# Patient Record
Sex: Male | Born: 1965 | Race: White | Hispanic: No | Marital: Married | Smoking: Current every day smoker
Health system: Southern US, Community
[De-identification: ages and names within clinical notes are randomized; demographics above are authoritative.]

## PROBLEM LIST (undated history)

## (undated) DIAGNOSIS — F32A Depression, unspecified: Secondary | ICD-10-CM

## (undated) DIAGNOSIS — F329 Major depressive disorder, single episode, unspecified: Secondary | ICD-10-CM

## (undated) DIAGNOSIS — I214 Non-ST elevation (NSTEMI) myocardial infarction: Secondary | ICD-10-CM

## (undated) DIAGNOSIS — I10 Essential (primary) hypertension: Secondary | ICD-10-CM

## (undated) DIAGNOSIS — F419 Anxiety disorder, unspecified: Secondary | ICD-10-CM

## (undated) DIAGNOSIS — E785 Hyperlipidemia, unspecified: Secondary | ICD-10-CM

## (undated) DIAGNOSIS — M5126 Other intervertebral disc displacement, lumbar region: Secondary | ICD-10-CM

## (undated) HISTORY — PX: BACK SURGERY: SHX140

## (undated) HISTORY — PX: TONSILLECTOMY: SUR1361

---

## 2000-03-22 HISTORY — PX: LUMBAR LAMINECTOMY: SHX95

## 2014-09-24 ENCOUNTER — Encounter (HOSPITAL_COMMUNITY): Payer: Self-pay

## 2014-09-24 ENCOUNTER — Emergency Department (HOSPITAL_COMMUNITY)
Admission: EM | Admit: 2014-09-24 | Discharge: 2014-09-24 | Disposition: A | Discharge status: Home / Self Care | Payer: BLUE CROSS/BLUE SHIELD | Attending: Emergency Medicine | Admitting: Emergency Medicine

## 2014-09-24 ENCOUNTER — Emergency Department (HOSPITAL_COMMUNITY): Payer: BLUE CROSS/BLUE SHIELD

## 2014-09-24 DIAGNOSIS — I10 Essential (primary) hypertension: Secondary | ICD-10-CM | POA: Diagnosis not present

## 2014-09-24 DIAGNOSIS — N451 Epididymitis: Secondary | ICD-10-CM | POA: Diagnosis not present

## 2014-09-24 DIAGNOSIS — K4090 Unilateral inguinal hernia, without obstruction or gangrene, not specified as recurrent: Secondary | ICD-10-CM

## 2014-09-24 DIAGNOSIS — Z720 Tobacco use: Secondary | ICD-10-CM | POA: Insufficient documentation

## 2014-09-24 DIAGNOSIS — Z8659 Personal history of other mental and behavioral disorders: Secondary | ICD-10-CM | POA: Diagnosis not present

## 2014-09-24 DIAGNOSIS — R52 Pain, unspecified: Secondary | ICD-10-CM

## 2014-09-24 DIAGNOSIS — N508 Other specified disorders of male genital organs: Secondary | ICD-10-CM | POA: Diagnosis present

## 2014-09-24 HISTORY — DX: Major depressive disorder, single episode, unspecified: F32.9

## 2014-09-24 HISTORY — DX: Depression, unspecified: F32.A

## 2014-09-24 HISTORY — DX: Essential (primary) hypertension: I10

## 2014-09-24 HISTORY — DX: Anxiety disorder, unspecified: F41.9

## 2014-09-24 LAB — URINALYSIS, ROUTINE W REFLEX MICROSCOPIC
BILIRUBIN URINE: NEGATIVE
Glucose, UA: NEGATIVE mg/dL
KETONES UR: NEGATIVE mg/dL
Nitrite: NEGATIVE
Protein, ur: NEGATIVE mg/dL
Specific Gravity, Urine: 1.013 (ref 1.005–1.030)
UROBILINOGEN UA: 0.2 mg/dL (ref 0.0–1.0)
pH: 7 (ref 5.0–8.0)

## 2014-09-24 LAB — URINE MICROSCOPIC-ADD ON

## 2014-09-24 MED ORDER — LEVOFLOXACIN 500 MG PO TABS: 500.0000 mg | ORAL_TABLET | Freq: Two times a day (BID) | ORAL | Status: DC

## 2014-09-24 NOTE — Discharge Instructions (Signed)
Epididymitis Epididymitis is a swelling (inflammation) of the epididymis. The epididymis is a cord-like structure along the back part of the testicle. Epididymitis is usually, but not always, caused by infection. This is usually a sudden problem beginning with chills, fever and pain behind the scrotum and in the testicle. There may be swelling and redness of the testicle. DIAGNOSIS  Physical examination will reveal a tender, swollen epididymis. Sometimes, cultures are obtained from the urine or from prostate secretions to help find out if there is an infection or if the cause is a different problem. Sometimes, blood work is performed to see if your white blood cell count is elevated and if a germ (bacterial) or viral infection is present. Using this knowledge, an appropriate medicine which kills germs (antibiotic) can be chosen by your caregiver. A viral infection causing epididymitis will most often go away (resolve) without treatment. HOME CARE INSTRUCTIONS   Hot sitz baths for 20 minutes, 4 times per day, may help relieve pain.  Only take over-the-counter or prescription medicines for pain, discomfort or fever as directed by your caregiver.  Take all medicines, including antibiotics, as directed. Take the antibiotics for the full prescribed length of time even if you are feeling better.  It is very important to keep all follow-up appointments. SEEK IMMEDIATE MEDICAL CARE IF:   You have a fever.  You have pain not relieved with medicines.  You have any worsening of your problems.  Your pain seems to come and go.  You develop pain, redness, and swelling in the scrotum and surrounding areas. MAKE SURE YOU:   Understand these instructions.  Will watch your condition.  Will get help right away if you are not doing well or get worse. Document Released: 03/05/2000 Document Revised: 05/31/2011 Document Reviewed: 01/23/2009 [facility] Patient Information 2015 [place], [place]. This information  is not intended to replace advice given to you by your health care provider. Make sure you discuss any questions you have with your health care provider. Hernia A hernia occurs when an internal organ pushes out through a weak spot in the abdominal wall. Hernias most commonly occur in the groin and around the navel. Hernias often can be pushed back into place (reduced). Most hernias tend to get worse over time. Some abdominal hernias can get stuck in the opening (irreducible or incarcerated hernia) and cannot be reduced. An irreducible abdominal hernia which is tightly squeezed into the opening is at risk for impaired blood supply (strangulated hernia). A strangulated hernia is a medical emergency. Because of the risk for an irreducible or strangulated hernia, surgery may be recommended to repair a hernia. CAUSES   Heavy lifting.  Prolonged coughing.  Straining to have a bowel movement.  A cut (incision) made during an abdominal surgery. HOME CARE INSTRUCTIONS   Bed rest is not required. You may continue your normal activities.  Avoid lifting more than 10 pounds (4.5 kg) or straining.  Cough gently. If you are a smoker it is best to stop. Even the best hernia repair can break down with the continual strain of coughing. Even if you do not have your hernia repaired, a cough will continue to aggravate the problem.  Do not wear anything tight over your hernia. Do not try to keep it in with an outside bandage or truss. These can damage abdominal contents if they are trapped within the hernia sac.  Eat a normal diet.  Avoid constipation. Straining over long periods of time will increase hernia size and encourage  breakdown of repairs. If you cannot do this with diet alone, stool softeners may be used. SEEK IMMEDIATE MEDICAL CARE IF:   You have a fever.  You develop increasing abdominal pain.  You feel nauseous or vomit.  Your hernia is stuck outside the abdomen, looks discolored, feels hard,  or is tender.  You have any changes in your bowel habits or in the hernia that are unusual for you.  You have increased pain or swelling around the hernia.  You cannot push the hernia back in place by applying gentle pressure while lying down. MAKE SURE YOU:   Understand these instructions.  Will watch your condition.  Will get help right away if you are not doing well or get worse. Document Released: 03/08/2005 Document Revised: 05/31/2011 Document Reviewed: 10/26/2007 [facility] Patient Information 2015 [place]. This information is not intended to replace advice given to you by your health care provider. Make sure you discuss any questions you have with your health care provider.

## 2014-09-24 NOTE — ED Provider Notes (Signed)
CSN: [id number]     Arrival date & time 09/24/14  0920 History   First MD Initiated Contact with Patient 09/24/14 [phone]     Chief Complaint  Patient presents with  . Abdominal Pain  . Testicle Pain     (Consider location/radiation/quality/duration/timing/severity/associated sxs/prior Treatment) HPI Comments: Patient here complaining of swelling to his right inguinal canal as well as right scrotum times several days. Pain is been intermittent and worse with lifting objects. Denies any dysuria or hematuria. Pain resolves with rest as well as spontaneously. No prior history of same. Denies any vomiting but has had some nausea. No fever noted. Pain characterized as soreness. No treatment used for this prior to arrival  Patient is a 49 y.o. male presenting with abdominal pain and testicular pain. The history is provided by the patient.  Abdominal Pain Testicle Pain Associated symptoms include abdominal pain.    Past Medical History  Diagnosis Date  . Hypertension   . Depression   . Anxiety    Past Surgical History  Procedure Laterality Date  . Back surgery     History reviewed. No pertinent family history. History  Substance Use Topics  . Smoking status: Current Every Day Smoker -- 1.00 packs/day    Types: Cigarettes  . Smokeless tobacco: Not on file  . Alcohol Use: Yes     Comment: occ    Review of Systems  Gastrointestinal: Positive for abdominal pain.  Genitourinary: Positive for testicular pain.  All other systems reviewed and are negative.     Allergies  Review of patient's allergies indicates not on file.  Home Medications   Prior to Admission medications   Not on File   BP 180/98 mmHg  Pulse 63  Temp(Src) 97.6 F (36.4 C) (Oral)  Resp 16  SpO2 98% Physical Exam  Constitutional: He is oriented to person, place, and time. He appears well-developed and well-nourished.  Non-toxic appearance. No distress.  HENT:  Head: Normocephalic and atraumatic.  Eyes:  Conjunctivae, EOM and lids are normal. Pupils are equal, round, and reactive to light.  Neck: Normal range of motion. Neck supple. No tracheal deviation present. No thyroid mass present.  Cardiovascular: Normal rate, regular rhythm and normal heart sounds.  Exam reveals no gallop.   No murmur heard. Pulmonary/Chest: Effort normal and breath sounds normal. No stridor. No respiratory distress. He has no decreased breath sounds. He has no wheezes. He has no rhonchi. He has no rales.  Abdominal: Soft. Normal appearance and bowel sounds are normal. He exhibits no distension. There is no tenderness. There is no rebound and no CVA tenderness.  Genitourinary:    Circumcised.  Musculoskeletal: Normal range of motion. He exhibits no edema or tenderness.  Neurological: He is alert and oriented to person, place, and time. He has normal strength. No cranial nerve deficit or sensory deficit. GCS eye subscore is 4. GCS verbal subscore is 5. GCS motor subscore is 6.  Skin: Skin is warm and dry. No abrasion and no rash noted.  Psychiatric: He has a normal mood and affect. His speech is normal and behavior is normal.  Nursing note and vitals reviewed.   ED Course  Procedures (including critical care time) Labs Review Labs Reviewed - No data to display  Imaging Review No results found.   EKG Interpretation None      MDM   Final diagnoses:  Pain    Patient's ultrasound shows epididymitis, and urinalysis shows infection. Will place on Cipro and give him follow-up  with urology    Lorre Nick, MD 09/24/14 [phone]

## 2014-09-24 NOTE — ED Notes (Signed)
Pt c/o RLQ abdominal pain/swelling and R testicle pain/swelling after lifting a recliner yesterday.  Pain score 9/10.  Pt reports similar episode x a couple weeks ago after moving tool box.  Sts symptoms resolved on there own previously.

## 2014-09-25 LAB — URINE CULTURE: Culture: >=100000

## 2014-09-26 ENCOUNTER — Telehealth (HOSPITAL_BASED_OUTPATIENT_CLINIC_OR_DEPARTMENT_OTHER): Payer: Self-pay | Admitting: Emergency Medicine

## 2014-09-26 NOTE — Telephone Encounter (Signed)
Post ED Visit - Positive Culture Follow-up  Culture report reviewed by antimicrobial stewardship pharmacist: []  Wes BluntDulaney, Pharm.D., BCPS []  Celedonio MiyamotoJeremy Frens, Pharm.D., BCPS []  Georgina PillionElizabeth Martin, Pharm.D., BCPS []  NashuaMinh Pham, [address]Pharm[address]D., BCPS, AAHIVP []  Estella HuskMichelle Turner, Pharm.D., BCPS, AAHIVP []  Elder CyphersLorie Poole, [address]Pharm.D., BCPS Garvin FilaMike Maccia PharmD  Positive urine culture group B strep Treated with levofloxacin, organism sensitive to the same and no further patient follow-up is required at this time.  Berle MullMiller, Aaliyah Cancro 09/26/2014, 8:45 AM

## 2015-04-15 ENCOUNTER — Inpatient Hospital Stay (HOSPITAL_COMMUNITY)
Admission: EM | Admit: 2015-04-15 | Discharge: 2015-04-22 | DRG: 234 | Disposition: A | Discharge status: Home / Self Care | Payer: BLUE CROSS/BLUE SHIELD | Attending: Cardiothoracic Surgery | Admitting: Cardiothoracic Surgery

## 2015-04-15 ENCOUNTER — Encounter (HOSPITAL_COMMUNITY): Payer: Self-pay

## 2015-04-15 ENCOUNTER — Other Ambulatory Visit: Payer: Self-pay | Admitting: *Deleted

## 2015-04-15 ENCOUNTER — Encounter (HOSPITAL_COMMUNITY)
Admission: EM | Disposition: A | Discharge status: Home / Self Care | Payer: Self-pay | Source: Home / Self Care | Attending: Cardiothoracic Surgery

## 2015-04-15 ENCOUNTER — Emergency Department (HOSPITAL_COMMUNITY): Payer: BLUE CROSS/BLUE SHIELD

## 2015-04-15 DIAGNOSIS — R079 Chest pain, unspecified: Secondary | ICD-10-CM | POA: Diagnosis present

## 2015-04-15 DIAGNOSIS — I25110 Atherosclerotic heart disease of native coronary artery with unstable angina pectoris: Secondary | ICD-10-CM

## 2015-04-15 DIAGNOSIS — I214 Non-ST elevation (NSTEMI) myocardial infarction: Principal | ICD-10-CM

## 2015-04-15 DIAGNOSIS — J9811 Atelectasis: Secondary | ICD-10-CM | POA: Diagnosis not present

## 2015-04-15 DIAGNOSIS — F419 Anxiety disorder, unspecified: Secondary | ICD-10-CM | POA: Diagnosis present

## 2015-04-15 DIAGNOSIS — I2510 Atherosclerotic heart disease of native coronary artery without angina pectoris: Secondary | ICD-10-CM

## 2015-04-15 DIAGNOSIS — Z8249 Family history of ischemic heart disease and other diseases of the circulatory system: Secondary | ICD-10-CM | POA: Diagnosis not present

## 2015-04-15 DIAGNOSIS — E785 Hyperlipidemia, unspecified: Secondary | ICD-10-CM | POA: Diagnosis present

## 2015-04-15 DIAGNOSIS — Z951 Presence of aortocoronary bypass graft: Secondary | ICD-10-CM

## 2015-04-15 DIAGNOSIS — M549 Dorsalgia, unspecified: Secondary | ICD-10-CM | POA: Diagnosis present

## 2015-04-15 DIAGNOSIS — F329 Major depressive disorder, single episode, unspecified: Secondary | ICD-10-CM | POA: Diagnosis present

## 2015-04-15 DIAGNOSIS — E876 Hypokalemia: Secondary | ICD-10-CM | POA: Diagnosis present

## 2015-04-15 DIAGNOSIS — I10 Essential (primary) hypertension: Secondary | ICD-10-CM | POA: Diagnosis present

## 2015-04-15 DIAGNOSIS — J449 Chronic obstructive pulmonary disease, unspecified: Secondary | ICD-10-CM | POA: Diagnosis present

## 2015-04-15 DIAGNOSIS — F17210 Nicotine dependence, cigarettes, uncomplicated: Secondary | ICD-10-CM | POA: Diagnosis present

## 2015-04-15 DIAGNOSIS — E8770 Fluid overload, unspecified: Secondary | ICD-10-CM | POA: Diagnosis not present

## 2015-04-15 DIAGNOSIS — Z23 Encounter for immunization: Secondary | ICD-10-CM

## 2015-04-15 DIAGNOSIS — Z09 Encounter for follow-up examination after completed treatment for conditions other than malignant neoplasm: Secondary | ICD-10-CM

## 2015-04-15 DIAGNOSIS — Z720 Tobacco use: Secondary | ICD-10-CM

## 2015-04-15 HISTORY — DX: Hyperlipidemia, unspecified: E78.5

## 2015-04-15 HISTORY — DX: Other intervertebral disc displacement, lumbar region: M51.26

## 2015-04-15 HISTORY — DX: Non-ST elevation (NSTEMI) myocardial infarction: I21.4

## 2015-04-15 HISTORY — PX: CARDIAC CATHETERIZATION: SHX172

## 2015-04-15 LAB — PROTIME-INR
INR: 1.07 (ref 0.00–1.49)
Prothrombin Time: 14.1 seconds (ref 11.6–15.2)

## 2015-04-15 LAB — CBC
HCT: 43.4 % (ref 39.0–52.0)
Hemoglobin: 15.8 g/dL (ref 13.0–17.0)
MCH: 30.6 pg (ref 26.0–34.0)
MCHC: 36.4 g/dL — ABNORMAL HIGH (ref 30.0–36.0)
MCV: 83.9 fL (ref 78.0–100.0)
Platelets: 161 10*3/uL (ref 150–400)
RBC: 5.17 MIL/uL (ref 4.22–5.81)
RDW: 13 % (ref 11.5–15.5)
WBC: 10.2 10*3/uL (ref 4.0–10.5)

## 2015-04-15 LAB — BASIC METABOLIC PANEL
Anion gap: 13 (ref 5–15)
BUN: 13 mg/dL (ref 6–20)
CO2: 24 mmol/L (ref 22–32)
Calcium: 9.5 mg/dL (ref 8.9–10.3)
Chloride: 103 mmol/L (ref 101–111)
Creatinine, Ser: 0.77 mg/dL (ref 0.61–1.24)
GFR calc Af Amer: >60 mL/min (ref >60)
GFR calc non Af Amer: >60 mL/min (ref >60)
Glucose, Bld: 112 mg/dL — ABNORMAL HIGH (ref 65–99)
Potassium: 3.3 mmol/L — ABNORMAL LOW (ref 3.5–5.1)
Sodium: 140 mmol/L (ref 135–145)

## 2015-04-15 LAB — I-STAT TROPONIN, ED: Troponin i, poc: 0.1 ng/mL (ref 0.00–0.08)

## 2015-04-15 LAB — TROPONIN I: Troponin I: 0.63 ng/mL (ref <0.031)

## 2015-04-15 LAB — TSH: TSH: 1.116 u[IU]/mL (ref 0.350–4.500)

## 2015-04-15 SURGERY — LEFT HEART CATH AND CORONARY ANGIOGRAPHY

## 2015-04-15 MED ORDER — NEBIVOLOL HCL 5 MG PO TABS
10.0000 mg | ORAL_TABLET | Freq: Every day | ORAL | Status: DC
  Administered 2015-04-16 – 2015-04-17 (×2): 10 mg via ORAL
  Filled 2015-04-15 (×2): qty 2

## 2015-04-15 MED ORDER — ATORVASTATIN CALCIUM 80 MG PO TABS
80.0000 mg | ORAL_TABLET | Freq: Every day | ORAL | Status: DC
  Administered 2015-04-15 – 2015-04-21 (×6): 80 mg via ORAL
  Filled 2015-04-15 (×6): qty 1

## 2015-04-15 MED ORDER — ONDANSETRON HCL 4 MG/2ML IJ SOLN: 4.0000 mg | Freq: Four times a day (QID) | INTRAMUSCULAR | Status: DC | PRN

## 2015-04-15 MED ORDER — TRAMADOL HCL 50 MG PO TABS
50.0000 mg | ORAL_TABLET | Freq: Four times a day (QID) | ORAL | Status: DC | PRN
  Administered 2015-04-16 – 2015-04-17 (×4): 50 mg via ORAL
  Filled 2015-04-15 (×4): qty 1

## 2015-04-15 MED ORDER — HEPARIN SODIUM (PORCINE) 1000 UNIT/ML IJ SOLN
INTRAMUSCULAR | Status: AC
  Filled 2015-04-15: qty 1

## 2015-04-15 MED ORDER — PREDNISONE 10 MG PO TABS
40.0000 mg | ORAL_TABLET | Freq: Every day | ORAL | Status: AC
  Administered 2015-04-19: 40 mg via ORAL
  Filled 2015-04-15: qty 4

## 2015-04-15 MED ORDER — FENTANYL CITRATE (PF) 100 MCG/2ML IJ SOLN
INTRAMUSCULAR | Status: AC
  Filled 2015-04-15: qty 2

## 2015-04-15 MED ORDER — ASPIRIN 81 MG PO CHEW: 81.0000 mg | CHEWABLE_TABLET | Freq: Every day | ORAL | Status: DC

## 2015-04-15 MED ORDER — FENTANYL CITRATE (PF) 100 MCG/2ML IJ SOLN
INTRAMUSCULAR | Status: DC | PRN
  Administered 2015-04-15: 25 ug via INTRAVENOUS

## 2015-04-15 MED ORDER — ACETAMINOPHEN 325 MG PO TABS: 650.0000 mg | ORAL_TABLET | ORAL | Status: DC | PRN

## 2015-04-15 MED ORDER — LIDOCAINE HCL (PF) 1 % IJ SOLN
INTRAMUSCULAR | Status: AC
  Filled 2015-04-15: qty 30

## 2015-04-15 MED ORDER — NITROGLYCERIN 1 MG/10 ML FOR IR/CATH LAB
INTRA_ARTERIAL | Status: AC
  Filled 2015-04-15: qty 10

## 2015-04-15 MED ORDER — HEPARIN (PORCINE) IN NACL 100-0.45 UNIT/ML-% IJ SOLN
1700.0000 [IU]/h | INTRAMUSCULAR | Status: DC
  Administered 2015-04-16: 1200 [IU]/h via INTRAVENOUS
  Administered 2015-04-16: 1500 [IU]/h via INTRAVENOUS
  Administered 2015-04-17 (×2): 1700 [IU]/h via INTRAVENOUS
  Filled 2015-04-15 (×3): qty 250

## 2015-04-15 MED ORDER — FLUOXETINE HCL 20 MG PO CAPS
30.0000 mg | ORAL_CAPSULE | Freq: Every day | ORAL | Status: DC
  Administered 2015-04-16 – 2015-04-17 (×2): 30 mg via ORAL
  Filled 2015-04-15 (×2): qty 1

## 2015-04-15 MED ORDER — SODIUM CHLORIDE 0.9% FLUSH: 3.0000 mL | INTRAVENOUS | Status: DC | PRN

## 2015-04-15 MED ORDER — SODIUM CHLORIDE 0.9 % IV SOLN
250.0000 mL | INTRAVENOUS | Status: DC | PRN
  Administered 2015-04-18: 15:00:00 via INTRAVENOUS

## 2015-04-15 MED ORDER — NITROGLYCERIN IN D5W 200-5 MCG/ML-% IV SOLN
2.0000 ug/min | INTRAVENOUS | Status: DC
  Administered 2015-04-15 (×2): 10 ug/min via INTRAVENOUS
  Filled 2015-04-15: qty 250

## 2015-04-15 MED ORDER — SODIUM CHLORIDE 0.9% FLUSH
3.0000 mL | Freq: Two times a day (BID) | INTRAVENOUS | Status: DC
  Administered 2015-04-15 – 2015-04-17 (×3): 3 mL via INTRAVENOUS

## 2015-04-15 MED ORDER — TAMSULOSIN HCL 0.4 MG PO CAPS
0.4000 mg | ORAL_CAPSULE | Freq: Every day | ORAL | Status: DC
  Administered 2015-04-15 – 2015-04-17 (×3): 0.4 mg via ORAL
  Filled 2015-04-15 (×3): qty 1

## 2015-04-15 MED ORDER — SODIUM CHLORIDE 0.9% FLUSH: 3.0000 mL | Freq: Two times a day (BID) | INTRAVENOUS | Status: DC

## 2015-04-15 MED ORDER — MIDAZOLAM HCL 2 MG/2ML IJ SOLN
INTRAMUSCULAR | Status: AC
  Filled 2015-04-15: qty 2

## 2015-04-15 MED ORDER — ASPIRIN EC 81 MG PO TBEC
81.0000 mg | DELAYED_RELEASE_TABLET | Freq: Every day | ORAL | Status: DC
  Administered 2015-04-16 – 2015-04-17 (×2): 81 mg via ORAL
  Filled 2015-04-15 (×2): qty 1

## 2015-04-15 MED ORDER — NITROGLYCERIN 0.4 MG SL SUBL: 0.4000 mg | SUBLINGUAL_TABLET | SUBLINGUAL | Status: DC | PRN

## 2015-04-15 MED ORDER — LIDOCAINE HCL (PF) 1 % IJ SOLN
INTRAMUSCULAR | Status: DC | PRN
  Administered 2015-04-15: 3 mL via INTRA_ARTERIAL

## 2015-04-15 MED ORDER — HEPARIN (PORCINE) IN NACL 2-0.9 UNIT/ML-% IJ SOLN
INTRAMUSCULAR | Status: AC
  Filled 2015-04-15: qty 1000

## 2015-04-15 MED ORDER — POTASSIUM CHLORIDE CRYS ER 20 MEQ PO TBCR: 40.0000 meq | EXTENDED_RELEASE_TABLET | Freq: Once | ORAL | Status: DC

## 2015-04-15 MED ORDER — VERAPAMIL HCL 2.5 MG/ML IV SOLN
INTRA_ARTERIAL | Status: DC | PRN
  Administered 2015-04-15: 3 mL via INTRA_ARTERIAL

## 2015-04-15 MED ORDER — PREDNISONE 10 MG (48) PO TBPK: 10.0000 mg | ORAL_TABLET | Freq: Every day | ORAL | Status: DC

## 2015-04-15 MED ORDER — HEPARIN (PORCINE) IN NACL 100-0.45 UNIT/ML-% IJ SOLN
1200.0000 [IU]/h | INTRAMUSCULAR | Status: DC
  Administered 2015-04-15: 1200 [IU]/h via INTRAVENOUS
  Filled 2015-04-15: qty 250

## 2015-04-15 MED ORDER — IOHEXOL 350 MG/ML SOLN
INTRAVENOUS | Status: DC | PRN
  Administered 2015-04-15: 90 mL via INTRA_ARTERIAL

## 2015-04-15 MED ORDER — NICOTINE 14 MG/24HR TD PT24
14.0000 mg | MEDICATED_PATCH | TRANSDERMAL | Status: DC
  Administered 2015-04-15 – 2015-04-17 (×3): 14 mg via TRANSDERMAL
  Filled 2015-04-15 (×3): qty 1

## 2015-04-15 MED ORDER — PREDNISONE 10 MG PO TABS: 10.0000 mg | ORAL_TABLET | Freq: Every day | ORAL | Status: DC

## 2015-04-15 MED ORDER — HEPARIN BOLUS VIA INFUSION
4000.0000 [IU] | Freq: Once | INTRAVENOUS | Status: AC
  Administered 2015-04-15: 4000 [IU] via INTRAVENOUS
  Filled 2015-04-15: qty 4000

## 2015-04-15 MED ORDER — PREDNISONE 20 MG PO TABS
50.0000 mg | ORAL_TABLET | Freq: Every day | ORAL | Status: AC
  Administered 2015-04-16 – 2015-04-17 (×2): 50 mg via ORAL
  Filled 2015-04-15 (×2): qty 2

## 2015-04-15 MED ORDER — LIDOCAINE HCL (PF) 1 % IJ SOLN
INTRAMUSCULAR | Status: DC | PRN
  Administered 2015-04-15: 16:00:00

## 2015-04-15 MED ORDER — HEPARIN SODIUM (PORCINE) 1000 UNIT/ML IJ SOLN
INTRAMUSCULAR | Status: DC | PRN
  Administered 2015-04-15: 5000 [IU] via INTRAVENOUS

## 2015-04-15 MED ORDER — PNEUMOCOCCAL VAC POLYVALENT 25 MCG/0.5ML IJ INJ
0.5000 mL | INJECTION | INTRAMUSCULAR | Status: DC
  Filled 2015-04-15: qty 0.5

## 2015-04-15 MED ORDER — SODIUM CHLORIDE 0.9 % WEIGHT BASED INFUSION
3.0000 mL/kg/h | INTRAVENOUS | Status: AC
  Administered 2015-04-15: 3 mL/kg/h via INTRAVENOUS

## 2015-04-15 MED ORDER — ASPIRIN 325 MG PO TABS: 325.0000 mg | ORAL_TABLET | Freq: Every day | ORAL | Status: DC

## 2015-04-15 MED ORDER — VERAPAMIL HCL 2.5 MG/ML IV SOLN
INTRAVENOUS | Status: AC
  Filled 2015-04-15: qty 2

## 2015-04-15 MED ORDER — MIDAZOLAM HCL 2 MG/2ML IJ SOLN
INTRAMUSCULAR | Status: DC | PRN
  Administered 2015-04-15: 1 mg via INTRAVENOUS

## 2015-04-15 MED ORDER — SODIUM CHLORIDE 0.9 % IV SOLN: INTRAVENOUS | Status: DC

## 2015-04-15 MED ORDER — SODIUM CHLORIDE 0.9 % IV SOLN: 250.0000 mL | INTRAVENOUS | Status: DC | PRN

## 2015-04-15 MED ORDER — PREDNISONE 10 MG PO TABS
30.0000 mg | ORAL_TABLET | Freq: Every day | ORAL | Status: AC
  Administered 2015-04-20 – 2015-04-21 (×2): 30 mg via ORAL
  Filled 2015-04-15 (×2): qty 3

## 2015-04-15 MED ORDER — AMLODIPINE BESYLATE 10 MG PO TABS
10.0000 mg | ORAL_TABLET | Freq: Every day | ORAL | Status: DC
  Administered 2015-04-16 – 2015-04-17 (×2): 10 mg via ORAL
  Filled 2015-04-15 (×2): qty 1

## 2015-04-15 MED ORDER — NITROGLYCERIN 0.4 MG SL SUBL
0.4000 mg | SUBLINGUAL_TABLET | SUBLINGUAL | Status: AC | PRN
  Administered 2015-04-15 (×3): 0.4 mg via SUBLINGUAL

## 2015-04-15 MED ORDER — PREDNISONE 20 MG PO TABS
20.0000 mg | ORAL_TABLET | Freq: Every day | ORAL | Status: DC
  Administered 2015-04-22: 20 mg via ORAL
  Filled 2015-04-15: qty 1

## 2015-04-15 SURGICAL SUPPLY — 12 items
CATH INFINITI 5FR ANG PIGTAIL (CATHETERS) ×3 IMPLANT
CATH INFINITI JR4 5F (CATHETERS) ×3 IMPLANT
CATH OPTITORQUE TIG 4.0 5F (CATHETERS) ×3 IMPLANT
DEVICE RAD COMP TR BAND LRG (VASCULAR PRODUCTS) ×3 IMPLANT
GLIDESHEATH SLEND A-KIT 6F 22G (SHEATH) ×3 IMPLANT
KIT HEART LEFT (KITS) ×3 IMPLANT
PACK CARDIAC CATHETERIZATION (CUSTOM PROCEDURE TRAY) ×3 IMPLANT
SYR MEDRAD MARK V 150ML (SYRINGE) ×3 IMPLANT
TRANSDUCER W/STOPCOCK (MISCELLANEOUS) ×3 IMPLANT
TUBING CIL FLEX 10 FLL-RA (TUBING) ×3 IMPLANT
WIRE HI TORQ VERSACORE-J 145CM (WIRE) ×3 IMPLANT
WIRE SAFE-T 1.5MM-J .035X260CM (WIRE) ×3 IMPLANT

## 2015-04-15 NOTE — ED Notes (Signed)
Per EMS - CP starting 0300 this morning, felt like someone standing on center chest, radiation to back. Initially hypertensive (200/120). Pt given 2 nitro with some relief (pain 7/10 to 3/10). Pt hx htn. Pt given  aspirin.

## 2015-04-15 NOTE — ED Notes (Signed)
Patient given an urinal to use; visitor at bedside 

## 2015-04-15 NOTE — Progress Notes (Signed)
Patient ID: [name], male   DOB: 07-16-1965, 50 y.o.   MRN: [id number]      [address]       [place] [address]             [phone]        [facility] Medical Record #[id number] Date of Birth: [birth year]  Referring: Dr Allyson Sabal Primary Care: No primary care provider on file.  Chief Complaint:    Chief Complaint  Patient presents with  . Chest Pain    History of Present Illness:     The patient is a 50yo male with no previous cardiac history presented to er today with chest pain.  He has a  history of tobacco use  50 py , HTN, depression, anxiety, back s/p back  surgery. He has been  experiencing exertional chest pain for at least a year. At 0300 hrs. this morning, he was already awake, but started experiencing chest pain with radiation to his left arm and associated with nausea, shortness of breath, diaphoresis. It eased off and returned at 0800 hours it became severe so he called EMS. The fire dept gave him 4 baby aspirin and he noticed some mild relief.   Family history significant for father for having an MI at age 3. He is on IV heparin. The patient denies vomiting, fever, orthopnea, dizziness, PND, cough, congestion, abdominal pain, hematochezia, melena, lower extremity edema, claudication. No history of GI bleeding. He does have history of myalgias on statin. He was started on prednisone Dosepak at his primary care provider yesterday due to back pain.   Current Activity/ Functional Status: Patient is independent with mobility/ambulation, transfers, ADL's, IADL's.   Zubrod Score: At the time of surgery this patient's most appropriate activity status/level should be described as: []     0    Normal activity, no symptoms [x]     1    Restricted in physical strenuous activity but ambulatory, able to do out light work []     2    Ambulatory and capable of self care, unable to do work activities, up and about                 more than  50%  Of the time                            []     3    Only limited self care, in bed greater than 50% of waking hours []     4    Completely disabled, no self care, confined to bed or chair []     5    Moribund  Past Medical History  Diagnosis Date  . Hypertension   . Depression   . Anxiety     Past Surgical History  Procedure Laterality Date  . Back surgery      History  Smoking status  . Current Every Day Smoker -- 1.50 packs/day for 33 years  . Types: Cigarettes  Smokeless tobacco  . Not on file    History  Alcohol Use  . 0.0 oz/week  . 0 Standard drinks or equivalent per week    Comment: occ    Social History   Social History  . Marital Status: Married    Spouse Name: [name]  . Number of Children: [name]  . Years of Education: [name]   Occupational History  . Drives  cement mixer  Social History Main Topics  . Smoking status: Current Every Day Smoker -- 1.50 packs/day for 33 years    Types: Cigarettes  . Smokeless tobacco: Not on file  . Alcohol Use: 0.0 oz/week    0 Standard drinks or equivalent per week     Comment: occ  . Drug Use: No  .      Social History Narrative    Allergies  Allergen Reactions  . Claritin [Loratadine] Other (See Comments)    Makes allergies worse-"a lot worse"  . Codeine Nausea And Vomiting  . Vicodin [Hydrocodone-Acetaminophen] Nausea And Vomiting    "pretty much any codeine"    Current Facility-Administered Medications  Medication Dose Route Frequency Provider Last Rate Last Dose  . 0.9 %  sodium chloride infusion  250 mL Intravenous PRN Dwana Melena, PA-C      . 0.9 %  sodium chloride infusion   Intravenous Continuous Dwana Melena, PA-C   Stopped at 04/15/15 1654  . 0.9 %  sodium chloride infusion  250 mL Intravenous PRN Runell Gess, MD      . 0.9% sodium chloride infusion  3 mL/kg/hr Intravenous Continuous Runell Gess, MD 332.1 mL/hr at 04/15/15 1718 3 mL/kg/hr at 04/15/15 1718  . acetaminophen (TYLENOL)  tablet 650 mg  650 mg Oral Q4H PRN Runell Gess, MD      . Melene Muller ON 04/16/2015] amLODipine (NORVASC) tablet 10 mg  10 mg Oral Daily Dwana Melena, PA-C      . [START ON 04/16/2015] aspirin EC tablet 81 mg  81 mg Oral Daily Dwana Melena, PA-C      . atorvastatin (LIPITOR) tablet 80 mg  80 mg Oral q1800 Runell Gess, MD   80 mg at 04/15/15 1701  . [START ON 04/16/2015] FLUoxetine (PROZAC) capsule 30 mg  30 mg Oral Daily Dwana Melena, PA-C      . heparin ADULT infusion 100 units/mL (25000 units/250 mL)  1,200 Units/hr Intravenous Continuous Baldemar Friday, [facility]   Stopped at 04/15/15 1711  . [START ON 04/16/2015] nebivolol (BYSTOLIC) tablet 10 mg  10 mg Oral Daily Dwana Melena, PA-C      . nitroGLYCERIN (NITROSTAT) SL tablet 0.4 mg  0.4 mg Sublingual Q5 Min x 3 PRN Dwana Melena, PA-C      . nitroGLYCERIN 50 mg in dextrose 5 % 250 mL (0.2 mg/mL) infusion  2-200 mcg/min Intravenous Titrated Dwana Melena, PA-C 3 mL/hr at 04/15/15 1721 10 mcg/min at 04/15/15 1721  . ondansetron (ZOFRAN) injection 4 mg  4 mg Intravenous Q6H PRN Dwana Melena, PA-C      . potassium chloride SA (K-DUR,KLOR-CON) CR tablet 40 mEq  40 mEq Oral Once Dwana Melena, PA-C   Stopped at 04/15/15 1424  . [START ON 04/16/2015] predniSONE (DELTASONE) tablet 50 mg  50 mg Oral Q breakfast Runell Gess, MD       Followed by  . [START ON 04/18/2015] predniSONE (DELTASONE) tablet 40 mg  40 mg Oral Q breakfast Runell Gess, MD       Followed by  . [START ON 04/20/2015] predniSONE (DELTASONE) tablet 30 mg  30 mg Oral Q breakfast Runell Gess, MD       Followed by  . [START ON 04/22/2015] predniSONE (DELTASONE) tablet 20 mg  20 mg Oral Q breakfast Runell Gess, MD       Followed by  . [START ON 04/24/2015] predniSONE (DELTASONE) tablet  10 mg  10 mg Oral Q breakfast Runell Gess, MD      . sodium chloride flush (NS) 0.9 % injection 3 mL  3 mL Intravenous Q12H Dwana Melena, PA-C      . sodium chloride flush (NS) 0.9 %  injection 3 mL  3 mL Intravenous PRN Dwana Melena, PA-C      . sodium chloride flush (NS) 0.9 % injection 3 mL  3 mL Intravenous Q12H Runell Gess, MD   3 mL at 04/15/15 1733  . sodium chloride flush (NS) 0.9 % injection 3 mL  3 mL Intravenous PRN Runell Gess, MD      . tamsulosin [facility]) capsule 0.4 mg  0.4 mg Oral Daily Dwana Melena, PA-C   0.4 mg at 04/15/15 1701  . traMADol (ULTRAM) tablet 50 mg  50 mg Oral Q6H PRN Dwana Melena, PA-C        Prescriptions prior to admission  Medication Sig Dispense Refill Last Dose  . amLODipine (NORVASC) 10 MG tablet Take 10 mg by mouth daily.   04/15/2015 at Unknown time  . FLUoxetine (PROZAC) 20 MG capsule Take 30 mg by mouth daily.    04/15/2015 at Unknown time  . hydrochlorothiazide (HYDRODIURIL) 25 MG tablet Take 25 mg by mouth daily.   04/15/2015 at Unknown time  . nebivolol (BYSTOLIC) 10 MG tablet Take 10 mg by mouth daily.   04/15/2015 at 0830  . predniSONE (STERAPRED UNI-PAK 48 TAB) 10 MG (48) TBPK tablet Take 1-6 tablets by mouth daily. 12 day dose pack. 6,6,5,5,4,4,3,3,2,2,1,1   04/15/2015 at Unknown time  . tamsulosin (FLOMAX) 0.4 MG CAPS capsule Take 0.4 mg by mouth daily.  5 Past Week at Unknown time  . traMADol (ULTRAM) 50 MG tablet Take 50 mg by mouth every 6 (six) hours as needed for moderate pain.    Past Month at Unknown time  . valsartan (DIOVAN) 320 MG tablet Take 320 mg by mouth daily.   04/15/2015 at Unknown time    Family History  Problem Relation Age of Onset  . Heart attack Father 22    Deceased at this age  . Diabetes Sister   . Cancer Father     Lung   Father died of MI while being evaluated for lung cancer surgery at age 34 Mother has had abdominal aneurysm repair in the past, still alive , one sister has DM  Review of Systems:      Cardiac Review of Systems: Y or N  Chest Pain [  y  ]  Resting SOB [ n  ] Exertional SOB  Cove.Etienne  ]  Orthopnea [ n ]   Pedal Edema [  n ]    Palpitations [  n] Syncope  [ n ]     Presyncope [ n  ]  General Review of Systems: [Y] = yes [  ]=no Constitional: recent weight change [ n ]; anorexia [  ]; fatigue [ y ]; nausea [ n; night sweats [n  ]; fever [ n ]; or chills [n  ]                                                               Dental: poor dentition[  ]; Last  Dentist visit:   Eye : blurred vision [  ]; diplopia [   ]; vision changes [  ];  Amaurosis fugax[  ]; Resp: cough [  ];  wheezing[y  ];  hemoptysis[  ]n; shortness of breath[  ]; paroxysmal nocturnal dyspnea[ n ]; dyspnea on exertion[y  ]; or orthopnea[  ];  GI:  gallstones[  ], vomiting[n  ];  dysphagia[n  ]; melena[  ];  hematochezia [  ]; heartburn[  ];   Hx of  Colonoscopy[  ]; GU: kidney stones [  ]; hematuria[  ];   dysuria [  ];  nocturia[  ];  history of     obstruction [ n ]; urinary frequency [  ]             Skin: rash, swelling[  ];, hair loss[  ];  peripheral edema[  ];  or itching[  ]; Musculosketetal: myalgias[  ];  joint swelling[  ];  joint erythema[  ];  joint pain[  ];  back pain[  ];  Heme/Lymph: bruising[  ];  bleeding[  ];  anemia[  ];  Neuro: TIA[ n ];  headaches[  ];  stroke[  ];  vertigo[  ];  seizures[  ];   paresthesias[ y ];  difficulty walking[  ];  Psych:depression[  ]; anxiety[  ];  Endocrine: diabetes[n  ];  thyroid dysfunction[  ];  Immunizations: Flu Milo.Brash  ]; Pneumococcaln[  ];  Other: new pain left leg, similar to previous leg pain due to back pain  Physical Exam: BP 161/89 mmHg  Pulse 66  Temp(Src) 98.5 F (36.9 C) (Oral)  Resp 16  Ht 6' (1.829 m)  Wt 244 lb (110.678 kg)  BMI 33.09 kg/m2  SpO2 96%   General appearance: alert, cooperative, appears older than stated age and no distress Head: Normocephalic, without obvious abnormality, atraumatic Neck: no adenopathy, no carotid bruit, no JVD, supple, symmetrical, trachea midline and thyroid not enlarged, symmetric, no tenderness/mass/nodules Lymph nodes: Cervical, supraclavicular, and axillary nodes normal. Resp:  diminished breath sounds bibasilar Back: symmetric, no curvature. ROM normal. No CVA tenderness. Cardio: regular rate and rhythm, S1, S2 normal, no murmur, click, rub or gallop GI: soft, non-tender; bowel sounds normal; no masses,  no organomegaly Extremities: extremities normal, atraumatic, no cyanosis or edema and Homans sign is negative, no sign of DVT Neurologic: Grossly normal Right handed, pressure radial band in place, negative allen test left hand 2 + dp and pt pulses  bilaterial  Diagnostic Studies & Laboratory data:     Recent Radiology Findings:   Dg Chest 2 View  04/15/2015  CLINICAL DATA:  Central chest pain, shortness of breath this morning EXAM: CHEST  2 VIEW COMPARISON:  None. FINDINGS: Cardiomediastinal silhouette is unremarkable. No acute infiltrate or pleural effusion. No pulmonary edema. Bony thorax is unremarkable. IMPRESSION: No active cardiopulmonary disease. Electronically Signed   By: Natasha Mead M.D.   On: 04/15/2015 11:02     I have independently reviewed the above radiologic studies.  Recent Lab Findings: Lab Results  Component Value Date   WBC 10.2 04/15/2015   HGB 15.8 04/15/2015   HCT 43.4 04/15/2015   PLT 161 04/15/2015   GLUCOSE 112* 04/15/2015   NA 140 04/15/2015   K 3.3* 04/15/2015   CL 103 04/15/2015   CREATININE 0.77 04/15/2015   BUN 13 04/15/2015   CO2 24 04/15/2015   Troponin 0.10 04/15/2015 10:41am   Dominance: Right   Left Anterior Descending   .  Prox LAD to Mid LAD lesion, 50% stenosed. Calcified.     Ramus Intermedius   . Ost Ramus lesion, 60% stenosed.     Left Circumflex   . Prox Cx lesion, 95% stenosed. Calcified.   . Mid Cx lesion, 95% stenosed. Calcified.   . Mid Cx to Dist Cx lesion, 95% stenosed.     Right Coronary Artery   . Prox RCA-1 lesion, 75% stenosed. Calcified.   . Prox RCA-2 lesion, 80% stenosed. Calcified.   . Mid RCA lesion, 90% stenosed. Calcified.   . Dist RCA lesion, 60% stenosed. Calcified.   .  Right Posterior Descending Artery   . RPDA lesion, 60% stenosed. Calcified.      I have independently reviewed the above  cath films and reviewed the findings with the  patient . I think the lad at take off of diagonal is more then 50% but difficult to evaluate fully   Assessment / Plan:     Agree with recommendation of CABG to patient. Plan Friday 1/27 am   Discussed smoking suspension , patient and his wife plan to stop  The goals risks and alternatives of the planned surgical procedure CABG   have been discussed with the patient in detail. The risks of the procedure including death, infection, stroke, myocardial infarction, bleeding, blood transfusion have all been discussed specifically.  I have quoted [name] a 3% of perioperative mortality and a complication rate as high as 35%. The patient's questions have been answered.[name] is willing  to proceed with the planned procedure.  In addition to other potential risks and complications from the surgery, I have made the patient aware of the recent [facility] Health Advisory concerning the risk of infection by Myocobacterium chimaera related to the use of Stockert 3T heater-cooler equipment during cardiac surgery. I discussed with the patient the low risk of infection, as well as our compliance with the most current FDA recommendations to minimize infection and testing of all devices for contamination. The patient has been made aware of the limited alternatives to immediately replacing the current equipment. The patient has been informed regarding the risks associated with waiting to proceed with needed surgery and that such risks are greater than the risk of infection related to the use of the heater-cooler device. I did make the patient aware that after careful review of the patients having cardiac surgery at [facility] we have no evidence that heater/cooler related infections have occurred at [facility]. We discussed that this is a  slow-growing bacterium, such that it can take some period of time for symptoms to develop.   I  spent 40 minutes counseling the patient face to face and 50% or more the  time was spent in counseling and coordination of care. The total time spent in the appointment was 60 minutes.    Delight Ovens MD      [address] [place] [place] [address] Office [phone]   Beeper [phone]  04/15/2015 5:34 PM

## 2015-04-15 NOTE — ED Provider Notes (Signed)
CSN: [id number]     Arrival date & time 04/15/15  1021 History   First MD Initiated Contact with Patient 04/15/15 1023     Chief Complaint  Patient presents with  . Chest Pain     (Consider location/radiation/quality/duration/timing/severity/associated sxs/prior Treatment) HPI Comments: Patient with history of hypertension, smoking, MI in father at age 52 -- presents with complaint of chest pain awaking him from sleep at approximately 3:30 AM today. Patient described the pain as a pressure in the middle of his chest with radiation to his left arm and back. Patient describes diaphoresis and nausea but no vomiting. EMS was called prior to arrival and administered aspirin and nitroglycerin. First nitroglycerin improved pressure to 3 out of 10. Second nitroglycerin did not change symptoms. Normal EKG per EMS. No history of high cholesterol or diabetes. Patient denies recent fever, cough. He does report having episodes of shortness of breath and chest pain with exertion over the past year or so. These improved with rest. He did not seek evaluation or treatment for this. Symptoms have not been as bad as they were this morning.  The history is provided by the patient.    Past Medical History  Diagnosis Date  . Hypertension   . Depression   . Anxiety    Past Surgical History  Procedure Laterality Date  . Back surgery     No family history on file. Social History  Substance Use Topics  . Smoking status: Current Every Day Smoker -- 1.00 packs/day    Types: Cigarettes  . Smokeless tobacco: Not on file  . Alcohol Use: Yes     Comment: occ    Review of Systems  Constitutional: Positive for diaphoresis. Negative for fever.  Eyes: Negative for redness.  Respiratory: Positive for shortness of breath. Negative for cough.   Cardiovascular: Positive for chest pain. Negative for palpitations and leg swelling.  Gastrointestinal: Positive for nausea. Negative for vomiting and abdominal pain.   Genitourinary: Negative for dysuria.  Musculoskeletal: Positive for back pain (chronic, lower, unchanged). Negative for neck pain.  Skin: Negative for rash.  Neurological: Negative for syncope and light-headedness.  Psychiatric/Behavioral: The patient is not nervous/anxious.     Allergies  Claritin; Codeine; and Vicodin  Home Medications   Prior to Admission medications   Medication Sig Start Date End Date Taking? Authorizing Provider  amLODipine (NORVASC) 10 MG tablet Take 10 mg by mouth daily.    Historical Provider, MD  FLUoxetine (PROZAC) 20 MG capsule Take 20 mg by mouth daily.    Historical Provider, MD  hydrochlorothiazide (HYDRODIURIL) 25 MG tablet Take 25 mg by mouth daily.    Historical Provider, MD  levofloxacin (LEVAQUIN) 500 MG tablet Take 1 tablet (500 mg total) by mouth 2 (two) times daily. 09/24/14   Lorre Nick, MD  nebivolol (BYSTOLIC) 10 MG tablet Take 10 mg by mouth daily.    Historical Provider, MD  valsartan (DIOVAN) 320 MG tablet Take 320 mg by mouth daily.    Historical Provider, MD  Vitamin D, Ergocalciferol, (DRISDOL) 50000 UNITS CAPS capsule Take 1 capsule by mouth daily. monday 08/09/14   Historical Provider, MD   BP 155/98 mmHg  Pulse 66  Temp(Src) 98.5 F (36.9 C) (Oral)  Resp 19  Ht 6' (1.829 m)  Wt 110.678 kg  BMI 33.09 kg/m2  SpO2 98%   Physical Exam  Constitutional: He appears well-developed and well-nourished.  HENT:  Head: Normocephalic and atraumatic.  Mouth/Throat: Oropharynx is clear and moist and mucous  membranes are normal. Mucous membranes are not dry.  Eyes: Conjunctivae are normal.  Neck: Trachea normal and normal range of motion. Neck supple. Normal carotid pulses and no JVD present. No muscular tenderness present. Carotid bruit is not present. No tracheal deviation present.  Cardiovascular: Normal rate, regular rhythm, S1 normal, S2 normal, normal heart sounds and intact distal pulses.  Exam reveals no distant heart sounds and no  decreased pulses.   No murmur heard. Pulmonary/Chest: Effort normal and breath sounds normal. No respiratory distress. He has no wheezes. He exhibits no tenderness.  Abdominal: Soft. Normal aorta and bowel sounds are normal. There is no tenderness. There is no rebound and no guarding.  Musculoskeletal: He exhibits no edema.  Neurological: He is alert.  Skin: Skin is warm and dry. He is not diaphoretic. No cyanosis. No pallor.  Psychiatric: He has a normal mood and affect.  Nursing note and vitals reviewed.   ED Course  Procedures (including critical care time) Labs Review Labs Reviewed  BASIC METABOLIC PANEL - Abnormal; Notable for the following:    Potassium 3.3 (*)    Glucose, Bld 112 (*)    All other components within normal limits  CBC - Abnormal; Notable for the following:    MCHC 36.4 (*)    All other components within normal limits  I-STAT TROPOININ, ED - Abnormal; Notable for the following:    Troponin i, poc 0.10 (*)    All other components within normal limits  HEPARIN LEVEL (UNFRACTIONATED)    Imaging Review Dg Chest 2 View  04/15/2015  CLINICAL DATA:  Central chest pain, shortness of breath this morning EXAM: CHEST  2 VIEW COMPARISON:  None. FINDINGS: Cardiomediastinal silhouette is unremarkable. No acute infiltrate or pleural effusion. No pulmonary edema. Bony thorax is unremarkable. IMPRESSION: No active cardiopulmonary disease. Electronically Signed   By: Natasha Mead M.D.   On: 04/15/2015 11:02   I have personally reviewed and evaluated these images and lab results as part of my medical decision-making.  ED ECG REPORT   Date: 04/15/2015  Rate: 66  Rhythm: normal sinus rhythm  QRS Axis: normal  Intervals: normal  ST/T Wave abnormalities: normal  Conduction Disutrbances:none  Narrative Interpretation:   Old EKG Reviewed: none available  I have personally reviewed the EKG tracing and agree with the computerized printout as noted.   10:25 AM Patient seen and  examined approximately 15 minutes ago upon arrival to ED. Delay in registration as patient was waiting for room. Work-up now initiated. Medications ordered. D/w Dr. Clarene Duke.   Vital signs reviewed and are as follows: BP 155/98 mmHg  Pulse 66  Temp(Src) 98.5 F (36.9 C) (Oral)  Resp 19  Ht 6' (1.829 m)  Wt 110.678 kg  BMI 33.09 kg/m2  SpO2 98%  11:09 AM EKG reviewed and is normal. Story is highly concerning for ACS. Troponin slightly elevated at 0.10. CXR is normal. Will start heparin and call cardiology.   12:59 PM Cards seeing patient.   CRITICAL CARE Performed by: Carolee Rota Total critical care time: 30 minutes Critical care time was exclusive of separately billable procedures and treating other patients. Critical care was necessary to treat or prevent imminent or life-threatening deterioration. Critical care was time spent personally by me on the following activities: development of treatment plan with patient and/or surrogate as well as nursing, discussions with consultants, evaluation of patient's response to treatment, examination of patient, obtaining history from patient or surrogate, ordering and performing treatments and interventions, ordering  and review of laboratory studies, ordering and review of radiographic studies, pulse oximetry and re-evaluation of patient's condition.   MDM   Final diagnoses:  NSTEMI (non-ST elevated myocardial infarction) (HCC)   Admit.     Renne Crigler, PA-C 04/15/15 1300  Renne Crigler, PA-C 04/15/15 1346  Laurence Spates, MD 04/16/15 [phone]

## 2015-04-15 NOTE — Progress Notes (Signed)
ANTICOAGULATION CONSULT NOTE - Initial Consult  Pharmacy Consult for heparin Indication: chest pain/ACS  Allergies  Allergen Reactions  . Claritin [Loratadine] Other (See Comments)    Makes allergies worse-"a lot worse"  . Codeine Nausea And Vomiting  . Vicodin [Hydrocodone-Acetaminophen] Nausea And Vomiting    "pretty much any codeine"    Patient Measurements: Height: 6' (182.9 cm) Weight: 244 lb (110.678 kg) IBW/kg (Calculated) : 77.6 Heparin Dosing Weight: 101.1 kg  Vital Signs: Temp: 98.5 F (36.9 C) (01/24 1025) Temp Source: Oral (01/24 1025) BP: 155/98 mmHg (01/24 1025) Pulse Rate: 66 (01/24 1025)  Labs: No results for input(s): HGB, HCT, PLT, APTT, LABPROT, INR, HEPARINUNFRC, CREATININE, CKTOTAL, CKMB, TROPONINI in the last 72 hours.  CrCl cannot be calculated (Patient has no serum creatinine result on file.).   Medical History: Past Medical History  Diagnosis Date  . Hypertension   . Depression   . Anxiety     Medications:  See EMR  Assessment: 50 yo male admitted with chest pain with radiation to L arm and back. Pt rec'd aspirin 324 in EMS. CBC wnl, no anticoagulation PTA.  Goal of Therapy:  Heparin level 0.3-0.7 units/ml Monitor platelets by anticoagulation protocol: Yes   Plan:  -Heparin bolus 4000 units then 1200 units/hr -Daily HL, CBC, first level in 6 hours -Monitor s/sx bleeding -F/u cardiology plans  Baldemar Friday 04/15/2015,11:06 AM

## 2015-04-15 NOTE — ED Notes (Signed)
PA Hager advised patient scheduled for cardiac cath at 1530 today.

## 2015-04-15 NOTE — Progress Notes (Signed)
ANTICOAGULATION CONSULT NOTE - Follow Up Consult  Pharmacy Consult for Heparin Indication: multi-vessel disease  Allergies  Allergen Reactions  . Claritin [Loratadine] Other (See Comments)    Makes allergies worse-"a lot worse"  . Codeine Nausea And Vomiting  . Vicodin [Hydrocodone-Acetaminophen] Nausea And Vomiting    "pretty much any codeine"    Patient Measurements: Height: 6' (182.9 cm) Weight: 244 lb (110.678 kg) IBW/kg (Calculated) : 77.6 Heparin Dosing Weight: 101 kg  Vital Signs: Temp: 98.5 F (36.9 C) (01/24 1025) Temp Source: Oral (01/24 1025) BP: 161/89 mmHg (01/24 1703) Pulse Rate: 66 (01/24 1703)  Labs:  Recent Labs  04/15/15 1050  HGB 15.8  HCT 43.4  PLT 161  CREATININE 0.77    Estimated Creatinine Clearance: 140.3 mL/min (by C-G formula based on Cr of 0.77).   Medications:  Scheduled:  . [START ON 04/16/2015] amLODipine  10 mg Oral Daily  . [START ON 04/16/2015] aspirin EC  81 mg Oral Daily  . atorvastatin  80 mg Oral q1800  . [START ON 04/16/2015] FLUoxetine  30 mg Oral Daily  . [START ON 04/16/2015] nebivolol  10 mg Oral Daily  . potassium chloride  40 mEq Oral Once  . [START ON 04/16/2015] predniSONE  50 mg Oral Q breakfast   Followed by  . [START ON 04/18/2015] predniSONE  40 mg Oral Q breakfast   Followed by  . [START ON 04/20/2015] predniSONE  30 mg Oral Q breakfast   Followed by  . [START ON 04/22/2015] predniSONE  20 mg Oral Q breakfast   Followed by  . [START ON 04/24/2015] predniSONE  10 mg Oral Q breakfast  . sodium chloride flush  3 mL Intravenous Q12H  . sodium chloride flush  3 mL Intravenous Q12H  . tamsulosin  0.4 mg Oral Daily   Infusions:  . sodium chloride Stopped (04/15/15 1654)  . sodium chloride 3 mL/kg/hr (04/15/15 1718)  . heparin Stopped (04/15/15 1711)  . nitroGLYCERIN 10 mcg/min (04/15/15 1721)    Assessment: 50 yo M admitted with CP.  Pt was started on heparin and underwent cardiac cath which found multivessel  disease.  TCTS eval for CABG is pending.  Plan to restart heparin 6 hours after sheath and TR band removed.  Spoke with RN, anticipate TR band removal ~ 1830.  Pt was previously on heparin at 1200 units/hr.  No levels were obtained on this dose as patient was already in the cath lab for procedure.  Goal of Therapy:  Heparin level 0.3-0.7 units/ml Monitor platelets by anticoagulation protocol: Yes   Plan:  Restart heparin at 1200 units/hr at midnight. First heparin level 1/25 at 0800. Daily heparin level and CBC. Follow-up TCTS eval and plans for CABG.  [employer], Pharm.D., BCPS Clinical Pharmacist Pager [phone] 04/15/2015 5:57 PM

## 2015-04-15 NOTE — Interval H&P Note (Signed)
Cath Lab Visit (complete for each Cath Lab visit)  Clinical Evaluation Leading to the Procedure:   ACS: Yes.    Non-ACS:    Anginal Classification: CCS IV  Anti-ischemic medical therapy: No Therapy  Non-Invasive Test Results: No non-invasive testing performed  Prior CABG: No previous CABG      History and Physical Interval Note:  04/15/2015 2:58 PM  [name]  has presented today for surgery, with the diagnosis of non stemi  The various methods of treatment have been discussed with the patient and family. After consideration of risks, benefits and other options for treatment, the patient has consented to  Procedure(s): Left Heart Cath and Coronary Angiography ([name]) as a surgical intervention .  The patient's history has been reviewed, patient examined, no change in status, stable for surgery.  I have reviewed the patient's chart and labs.  Questions were answered to the patient's satisfaction.     [name]

## 2015-04-15 NOTE — H&P (Signed)
Cardiologist:  [name] is an 50 y.o. male.   Chief Complaint: Chest Pain HPI:   The patient is a 50yo male with a history of tobacco(49.5PY), HTN, depression, anxiety, back pain and surgery.  Reports she's been experiencing exertional chest pain for at least a year.  At 0300 hrs. this morning, he was already awake, but started experiencing chest pain with radiation to his left arm and associated with nausea, shortness of breath, diaphoresis. It eased off and returned at 0800 hours it became severe so he called EMS.  The fire dept gave him 4 baby aspirin and he noticed some mild relief. When EMS arrived they gave him 2 sublingual nitroglycerin with further reduction in pain.  He was then given 3 more sublingual nitroglycerin in the emergency room.  Current pain level is 1/10. Family history significant for father for having an MI at age 36.  He is on IV heparin.  The patient denies vomiting, fever, orthopnea, dizziness, PND, cough, congestion, abdominal pain, hematochezia, melena, lower extremity edema, claudication.  No history of GI bleeding.  He does have history of myalgias on statin.  He was started on prednisone Dosepak at his primary care provider yesterday due to back pain.   Medications: Medication Sig  amLODipine (NORVASC) 10 MG tablet Take 10 mg by mouth daily.  FLUoxetine (PROZAC) 20 MG capsule Take 30 mg by mouth daily.   hydrochlorothiazide (HYDRODIURIL) 25 MG tablet Take 25 mg by mouth daily.  nebivolol (BYSTOLIC) 10 MG tablet Take 10 mg by mouth daily.  predniSONE (STERAPRED UNI-PAK 48 TAB) 10 MG (48) TBPK tablet Take 1-6 tablets by mouth daily. 12 day dose pack. 6,6,5,5,4,4,3,3,2,2,1,1  tamsulosin (FLOMAX) 0.4 MG CAPS capsule Take 0.4 mg by mouth daily.  traMADol (ULTRAM) 50 MG tablet Take 50 mg by mouth 4 (four) times daily.  valsartan (DIOVAN) 320 MG tablet Take 320 mg by mouth daily.     Past Medical History  Diagnosis Date  . Hypertension   . Depression     . Anxiety     Past Surgical History  Procedure Laterality Date  . Back surgery      Family History  Problem Relation Age of Onset  . Heart attack Father 18    Deceased at this age  . Diabetes Sister   . Cancer Father     Lung   Social History:  reports that he has been smoking Cigarettes.  He has a 49.5 pack-year smoking history. He does not have any smokeless tobacco history on file. He reports that he drinks alcohol. He reports that he does not use illicit drugs.  Allergies:  Allergies  Allergen Reactions  . Claritin [Loratadine] Other (See Comments)    Makes allergies worse-"a lot worse"  . Codeine Nausea And Vomiting  . Vicodin [Hydrocodone-Acetaminophen] Nausea And Vomiting    "pretty much any codeine"     (Not in a hospital admission)  Results for orders placed or performed during the hospital encounter of 04/15/15 (from the past 48 hour(s))  I-stat troponin, ED     Status: Abnormal   Collection Time: 04/15/15 10:41 AM  Result Value Ref Range   Troponin i, poc 0.10 (HH) 0.00 - 0.08 ng/mL   Comment NOTIFIED PHYSICIAN    Comment 3            Comment: Due to the release kinetics of cTnI, a negative result within the first hours of the onset of symptoms does not rule out myocardial  infarction with certainty. If myocardial infarction is still suspected, repeat the test at appropriate intervals.   Basic metabolic panel     Status: Abnormal   Collection Time: 04/15/15 10:50 AM  Result Value Ref Range   Sodium 140 135 - 145 mmol/L   Potassium 3.3 (L) 3.5 - 5.1 mmol/L   Chloride 103 101 - 111 mmol/L   CO2 24 22 - 32 mmol/L   Glucose, Bld 112 (H) 65 - 99 mg/dL   BUN 13 6 - 20 mg/dL   Creatinine, Ser 0.77 0.61 - 1.24 mg/dL   Calcium 9.5 8.9 - 10.3 mg/dL   GFR calc non Af Amer >60 >60 mL/min   GFR calc Af Amer >60 >60 mL/min    Comment: (NOTE) The eGFR has been calculated using the CKD EPI equation. This calculation has not been validated in all clinical  situations. eGFR's persistently <60 mL/min signify possible Chronic Kidney Disease.    Anion gap 13 5 - 15  CBC     Status: Abnormal   Collection Time: 04/15/15 10:50 AM  Result Value Ref Range   WBC 10.2 4.0 - 10.5 K/uL   RBC 5.17 4.22 - 5.81 MIL/uL   Hemoglobin 15.8 13.0 - 17.0 g/dL   HCT 43.4 39.0 - 52.0 %   MCV 83.9 78.0 - 100.0 fL   MCH 30.6 26.0 - 34.0 pg   MCHC 36.4 (H) 30.0 - 36.0 g/dL   RDW 13.0 11.5 - 15.5 %   Platelets 161 150 - 400 K/uL   Dg Chest 2 View  04/15/2015  CLINICAL DATA:  Central chest pain, shortness of breath this morning EXAM: CHEST  2 VIEW COMPARISON:  None. FINDINGS: Cardiomediastinal silhouette is unremarkable. No acute infiltrate or pleural effusion. No pulmonary edema. Bony thorax is unremarkable. IMPRESSION: No active cardiopulmonary disease. Electronically Signed   By: Lahoma Crocker M.D.   On: 04/15/2015 11:02    Review of Systems  Constitutional: Positive for diaphoresis. Negative for fever and chills.  HENT: Negative for congestion and sore throat.   Respiratory: Positive for shortness of breath. Negative for cough.   Cardiovascular: Positive for chest pain (radiation to left arm). Negative for orthopnea, claudication, leg swelling and PND.  Gastrointestinal: Positive for nausea. Negative for vomiting, abdominal pain, blood in stool and melena.  Musculoskeletal: Positive for back pain (Chronic).  Neurological: Positive for tingling (left arm). Negative for dizziness, weakness and headaches.  All other systems reviewed and are negative.   Blood pressure 150/95, pulse 70, temperature 98.5 F (36.9 C), temperature source Oral, resp. rate 12, height 6' (1.829 m), weight 244 lb (110.678 kg), SpO2 97 %. Physical Exam  Nursing note and vitals reviewed. Constitutional: He is oriented to person, place, and time. He appears well-developed and well-nourished. No distress.  Obese  HENT:  Head: Normocephalic and atraumatic.  Mouth/Throat: No oropharyngeal  exudate.  Eyes: EOM are normal. Pupils are equal, round, and reactive to light. No scleral icterus.  Neck: Normal range of motion. Neck supple.  Cardiovascular: Normal rate, regular rhythm, S1 normal and S2 normal.   No murmur heard. Pulses:      Radial pulses are 2+ on the right side, and 2+ on the left side.       Dorsalis pedis pulses are 2+ on the right side, and 2+ on the left side.  No carotid bruits  Respiratory: Effort normal and breath sounds normal. No respiratory distress. He has no wheezes. He has no rales.  GI: Soft.  Bowel sounds are normal. He exhibits no distension. There is no tenderness.  Musculoskeletal: He exhibits no edema.  Lymphadenopathy:    He has no cervical adenopathy.  Neurological: He is alert and oriented to person, place, and time. He exhibits normal muscle tone.  Skin: Skin is warm and dry.  Psychiatric: He has a normal mood and affect.     Assessment/Plan Principal Problem:   NSTEMI (non-ST elevated myocardial infarction) [facility]) Active Problems:   Hypokalemia   Essential (primary) hypertension   Tobacco abuse  Patient will be admitted to stepdown.  He is on IV heparin and will be started on IV nitroglycerin as well. We'll continue his home beta blocker.  Will add a statin however, in the past he's had myalgias on several different brands.  We'll continue to cycle troponin and check lipid panel, TSH, A1c.  He has been admitted to the scheduled for left heart catheterization today.  EKG reveals less than 1 mm elevation and ST segment in 1, aVL. Inferior T-wave abnormality.  Replace potassium.  We'll hold ARB and HCTZ until we check serum creatinine on post cath labs.  Continue amlodipine and prednisone Dosepak starting day two, 60 mg for his back. Tramadol as well. Tobacco cessation discussed.  The patient understands that risks include but are not limited to stroke (1 in 1000), death (1 in 21), kidney failure [usually temporary] (1 in 500), bleeding (1 in  200), allergic reaction [possibly serious] (1 in 200). The patient understands and is willing to proceed.   Tarri Fuller, PA-C 04/15/2015, 1:22 PM   The patient was seen, examined and discussed with Tarri Fuller, PA-C and I agree with the above.   A very pleasant 50 year old gentleman who presented with chest pain that was pressure-like retrosternal pain started at 3 AM. The patient has history of hyperlipidemia intolerant to multiple statins including Lipitor, Crestor and Zocor, history of hypertension and exertional chest pain in the last few weeks. The patient has positive family history for premature coronary artery disease. His father died of massive heart attack at age of 47. His EKG shows normal sinus rhythm. His chest pain resolved with sublingual nitroglycerin, he ruled in for non-STEMI with positive troponin of 0.1. His normal creatinine. His potassium is low at 3.3 we will replace. We will schedule for left cardiac catheterization today and admitted to step down. The patient is ongoing smoker but determined to quit with this episode. We'll start him on aspirin hold statin secondary to prior intolerance, continue nebivolol hold ACEI/ARB prior to the cath. Continue heparin drip and nitroglycerin drip. If he has an evidence of coronary artery disease on his cardiac catheterization we will refer him to the lipid clinic after discharge for consideration of PC SK 9 inhibitors.  Dorothy Spark 04/15/2015

## 2015-04-15 NOTE — Progress Notes (Signed)
TR band off. No bleeding noted. Site is a level 0. Gauze and tegaderm applied

## 2015-04-15 NOTE — Plan of Care (Signed)
Problem: Phase I Progression Outcomes Goal: Pain controlled with appropriate interventions Outcome: Completed/Met Date Met:  04/15/15 Pt has no complaints of pain at this time

## 2015-04-15 NOTE — ED Notes (Signed)
Patient has returned from being out of the department; patient placed back on monitor, continuous pulse oximetry and blood pressure cuff; visitor at bedside 

## 2015-04-15 NOTE — Progress Notes (Signed)
Troponin 63

## 2015-04-16 ENCOUNTER — Encounter (HOSPITAL_COMMUNITY): Payer: Self-pay | Admitting: Cardiovascular Disease

## 2015-04-16 ENCOUNTER — Inpatient Hospital Stay (HOSPITAL_COMMUNITY): Payer: BLUE CROSS/BLUE SHIELD

## 2015-04-16 DIAGNOSIS — I2510 Atherosclerotic heart disease of native coronary artery without angina pectoris: Secondary | ICD-10-CM

## 2015-04-16 LAB — PULMONARY FUNCTION TEST
DL/VA % pred: 113 %
DL/VA: 5.39 ml/min/mmHg/L
DLCO unc % pred: 84 %
DLCO unc: 29.7 ml/min/mmHg
FEF 25-75 Post: 4.29 L/s
FEF 25-75 Pre: 3.09 L/s
FEF2575-%Change-Post: 38 %
FEF2575-%Pred-Post: 120 %
FEF2575-%Pred-Pre: 86 %
FEV1-%Change-Post: 10 %
FEV1-%Pred-Post: 74 %
FEV1-%Pred-Pre: 67 %
FEV1-Post: 3.07 L
FEV1-Pre: 2.78 L
FEV1FVC-%Change-Post: 6 %
FEV1FVC-%Pred-Pre: 106 %
FEV6-%Change-Post: 5 %
FEV6-%Pred-Post: 68 %
FEV6-%Pred-Pre: 64 %
FEV6-Post: 3.52 L
FEV6-Pre: 3.35 L
FEV6FVC-%Pred-Post: 104 %
FEV6FVC-%Pred-Pre: 104 %
FVC-%Change-Post: 3 %
FVC-%Pred-Post: 65 %
FVC-%Pred-Pre: 63 %
FVC-Post: 3.52 L
FVC-Pre: 3.39 L
Post FEV1/FVC ratio: 87 %
Post FEV6/FVC ratio: 100 %
Pre FEV1/FVC ratio: 82 %
Pre FEV6/FVC Ratio: 100 %
RV % pred: 85 %
RV: 1.86 L
TLC % pred: 71 %
TLC: 5.28 L

## 2015-04-16 LAB — POTASSIUM: Potassium: 3.4 mmol/L — ABNORMAL LOW (ref 3.5–5.1)

## 2015-04-16 LAB — HEPARIN LEVEL (UNFRACTIONATED): Heparin Unfractionated: 0.24 [IU]/mL — ABNORMAL LOW (ref 0.30–0.70)

## 2015-04-16 LAB — CBC
HEMATOCRIT: 41.1 % (ref 39.0–52.0)
Hemoglobin: 14.8 g/dL (ref 13.0–17.0)
MCH: 30.5 pg (ref 26.0–34.0)
MCHC: 36 g/dL (ref 30.0–36.0)
MCV: 84.6 fL (ref 78.0–100.0)
Platelets: 145 10*3/uL — ABNORMAL LOW (ref 150–400)
RBC: 4.86 MIL/uL (ref 4.22–5.81)
RDW: 13 % (ref 11.5–15.5)
WBC: 10.2 10*3/uL (ref 4.0–10.5)

## 2015-04-16 LAB — BASIC METABOLIC PANEL
ANION GAP: 13 (ref 5–15)
BUN: 14 mg/dL (ref 6–20)
CHLORIDE: 100 mmol/L — AB (ref 101–111)
CO2: 23 mmol/L (ref 22–32)
Calcium: 8.8 mg/dL — ABNORMAL LOW (ref 8.9–10.3)
Creatinine, Ser: 0.79 mg/dL (ref 0.61–1.24)
GFR calc Af Amer: >60 mL/min (ref >60)
GFR calc non Af Amer: >60 mL/min (ref >60)
GLUCOSE: 132 mg/dL — AB (ref 65–99)
POTASSIUM: 2.8 mmol/L — AB (ref 3.5–5.1)
Sodium: 136 mmol/L (ref 135–145)

## 2015-04-16 LAB — BLOOD GAS, ARTERIAL
Acid-base deficit: 0.6 mmol/L (ref 0.0–2.0)
Bicarbonate: 23.3 mEq/L (ref 20.0–24.0)
DRAWN BY: 242311
FIO2: 0.21
O2 Saturation: 94.7 %
PCO2 ART: 36.8 mmHg (ref 35.0–45.0)
PH ART: 7.418 (ref 7.350–7.450)
Patient temperature: 98.6
TCO2: 24.4 mmol/L (ref 0–100)
pO2, Arterial: 72.9 mmHg — ABNORMAL LOW (ref 80.0–100.0)

## 2015-04-16 LAB — SURGICAL PCR SCREEN
MRSA, PCR: NEGATIVE
STAPHYLOCOCCUS AUREUS: NEGATIVE

## 2015-04-16 LAB — HEMOGLOBIN A1C
HEMOGLOBIN A1C: 5.6 % (ref 4.8–5.6)
Mean Plasma Glucose: 114 mg/dL

## 2015-04-16 LAB — URINALYSIS, ROUTINE W REFLEX MICROSCOPIC
Bilirubin Urine: NEGATIVE
Glucose, UA: NEGATIVE mg/dL
Hgb urine dipstick: NEGATIVE
Ketones, ur: NEGATIVE mg/dL
Leukocytes, UA: NEGATIVE
Nitrite: NEGATIVE
Protein, ur: NEGATIVE mg/dL
Specific Gravity, Urine: 1.016 (ref 1.005–1.030)
pH: 7 (ref 5.0–8.0)

## 2015-04-16 LAB — TROPONIN I
TROPONIN I: 0.4 ng/mL — AB (ref <0.031)
Troponin I: 0.56 ng/mL

## 2015-04-16 LAB — LIPID PANEL
Cholesterol: 243 mg/dL — ABNORMAL HIGH (ref 0–200)
HDL: 38 mg/dL — ABNORMAL LOW (ref >40)
LDL CALC: 127 mg/dL — AB (ref 0–99)
Total CHOL/HDL Ratio: 6.4 RATIO
Triglycerides: 389 mg/dL — ABNORMAL HIGH (ref <150)
VLDL: 78 mg/dL — ABNORMAL HIGH (ref 0–40)

## 2015-04-16 MED ORDER — ALBUTEROL SULFATE (2.5 MG/3ML) 0.083% IN NEBU
2.5000 mg | INHALATION_SOLUTION | Freq: Once | RESPIRATORY_TRACT | Status: AC
  Administered 2015-04-16: 2.5 mg via RESPIRATORY_TRACT

## 2015-04-16 MED ORDER — IRBESARTAN 150 MG PO TABS
300.0000 mg | ORAL_TABLET | Freq: Every day | ORAL | Status: DC
  Administered 2015-04-16 – 2015-04-17 (×2): 300 mg via ORAL
  Filled 2015-04-16 (×2): qty 2

## 2015-04-16 MED ORDER — POTASSIUM CHLORIDE CRYS ER 20 MEQ PO TBCR
40.0000 meq | EXTENDED_RELEASE_TABLET | Freq: Once | ORAL | Status: AC
  Administered 2015-04-16: 40 meq via ORAL
  Filled 2015-04-16: qty 2

## 2015-04-16 NOTE — Progress Notes (Signed)
UR Completed Liberta Gimpel Graves-Bigelow, RN,BSN [phone]  

## 2015-04-16 NOTE — Progress Notes (Signed)
ANTICOAGULATION CONSULT NOTE - Follow Up Consult  Pharmacy Consult for Heparin Indication: multi-vessel disease  Allergies  Allergen Reactions  . Claritin [Loratadine] Other (See Comments)    Makes allergies worse-"a lot worse"  . Codeine Nausea And Vomiting  . Vicodin [Hydrocodone-Acetaminophen] Nausea And Vomiting    "pretty much any codeine"    Patient Measurements: Height: 6' (182.9 cm) Weight: 238 lb 12.8 oz (108.319 kg) IBW/kg (Calculated) : 77.6 Heparin Dosing Weight: 101 kg  Vital Signs: Temp: 97.7 F (36.5 C) (01/25 0310) Temp Source: Oral (01/25 0310) BP: 158/99 mmHg (01/25 0328) Pulse Rate: 71 (01/25 0310)  Labs:  Recent Labs  04/15/15 1050 04/15/15 1816 04/16/15 0020 04/16/15 0850  HGB 15.8  --  14.8  --   HCT 43.4  --  41.1  --   PLT 161  --  145*  --   LABPROT  --  14.1  --   --   INR  --  1.07  --   --   HEPARINUNFRC  --   --   --  <0.10*  CREATININE 0.77  --  0.79  --   TROPONINI  --  0.63* 0.56* 0.40*    Estimated Creatinine Clearance: 138.9 mL/min (by C-G formula based on Cr of 0.79).  Infusions:  . heparin 1,200 Units/hr (04/16/15 0001)  . nitroGLYCERIN 10 mcg/min (04/15/15 1721)    Assessment: 50 yo M admitted with CP. S/p cardiac cath which found multivessel disease, pending CABG 1/27. Heparin restarted last night. Heparin level undetectable on 1200 units/hr. hgb stable, plt 161 > 145K. Will watch  Goal of Therapy:  Heparin level 0.3-0.7 units/ml Monitor platelets by anticoagulation protocol: Yes   Plan:  Increase heparin to 1500 units/hr. f/u heparin level at 1800 Daily heparin level and CBC. Follow-up TCTS eval and plans for CABG.  Bayard Hugger, PharmD, BCPS  Clinical Pharmacist  Pager: [phone]  04/16/2015 11:02 AM

## 2015-04-16 NOTE — Progress Notes (Signed)
CARDIAC REHAB PHASE I   PRE:  Rate/Rhythm: 93 SR  BP:  Sitting: 141/84        SaO2: 98 RA  MODE:  Ambulation: 200 ft   POST:  Rate/Rhythm: 79 SR  BP:  Sitting: 161/96         SaO2: 100 RA  Pt states he has ambulated to the bathroom, c/o severe L Leg pain from herniated disc. Pt eager to get out of bed. Pt ambulated 200 ft on RA, IV, rolling walker, fairly steady gait, tolerated fair. Pt c/o 9/10 L leg pain, radiating to back, c/o mild DOE (improved with rest), denies CP, dizziness, declined rest stop. Pre-op education completed with pt and wife at bedside. Reviewed IS, sternal precautions, activity progression, cardiac surgery book, and cardiac surgery guidelines. Left instructions for cardiac surgery videos, pt verbalized understanding. Pt may benefit from PT post-op to maximize mobility as his pain related to his herniated disc is limiting. Pt to recliner after walk, call bell within reach. Will follow.      [id number]  Joylene Grapes, RN, BSN 04/16/2015 2:37 PM

## 2015-04-16 NOTE — Progress Notes (Signed)
Subjective: No complaints  Objective: Vital signs in last 24 hours: Temp:  [97.7 F (36.5 C)-99.1 F (37.3 C)] 97.7 F (36.5 C) (01/25 0310) Pulse Rate:  [62-281] 71 (01/25 0310) Resp:  [0-22] 13 (01/25 0328) BP: (128-193)/(86-119) 158/99 mmHg (01/25 0328) SpO2:  [0 %-100 %] 95 % (01/25 0310) Weight:  [238 lb 12.8 oz (108.319 kg)-244 lb (110.678 kg)] 238 lb 12.8 oz (108.319 kg) (01/25 0310) Last BM Date: 04/14/15  Intake/Output from previous day: 01/24 0701 - 01/25 0700 In: 332 [I.V.:332] Out: 1975 [Urine:1975] Intake/Output this shift:    Medications Scheduled Meds: . amLODipine  10 mg Oral Daily  . aspirin EC  81 mg Oral Daily  . atorvastatin  80 mg Oral q1800  . FLUoxetine  30 mg Oral Daily  . nebivolol  10 mg Oral Daily  . nicotine  14 mg Transdermal Q24H  . pneumococcal 23 valent vaccine  0.5 mL Intramuscular Tomorrow-1000  . potassium chloride  40 mEq Oral Once  . potassium chloride  40 mEq Oral Once  . predniSONE  50 mg Oral Q breakfast   Followed by  . [START ON 04/18/2015] predniSONE  40 mg Oral Q breakfast   Followed by  . [START ON 04/20/2015] predniSONE  30 mg Oral Q breakfast   Followed by  . [START ON 04/22/2015] predniSONE  20 mg Oral Q breakfast   Followed by  . [START ON 04/24/2015] predniSONE  10 mg Oral Q breakfast  . sodium chloride flush  3 mL Intravenous Q12H  . sodium chloride flush  3 mL Intravenous Q12H  . tamsulosin  0.4 mg Oral Daily   Continuous Infusions: . heparin 1,200 Units/hr (04/16/15 0001)  . nitroGLYCERIN 10 mcg/min (04/15/15 1721)   PRN Meds:.sodium chloride, sodium chloride, acetaminophen, nitroGLYCERIN, ondansetron (ZOFRAN) IV, sodium chloride flush, sodium chloride flush, traMADol  PE: General appearance: alert, cooperative and no distress Lungs: clear to auscultation bilaterally Heart: regular rate and rhythm, S1, S2 normal, no murmur, click, rub or gallop Extremities: No LEE Pulses: 2+ and symmetric Skin: warm and  dry.  Right w=rist cath site a little tender/sore.  No obvious ecchymosis or hematoma Neurologic: Grossly normal  Lab Results:   Recent Labs  04/15/15 1050 04/16/15 0020  WBC 10.2 10.2  HGB 15.8 14.8  HCT 43.4 41.1  PLT 161 145*   BMET  Recent Labs  04/15/15 1050 04/16/15 0020  NA 140 136  K 3.3* 2.8*  CL 103 100*  CO2 24 23  GLUCOSE 112* 132*  BUN 13 14  CREATININE 0.77 0.79  CALCIUM 9.5 8.8*   PT/INR  Recent Labs  04/15/15 1816  LABPROT 14.1  INR 1.07   Cholesterol  Recent Labs  04/16/15 0020  CHOL 243*   Cardiac Enzymes Invalid input(s): TROPONIN,  CKMB  Studies/Results: @   Assessment/Plan   Principal Problem:   NSTEMI (non-ST elevated myocardial infarction) (HCC), Troponin max-0.63 SP LHC revealing: Conclusion     Mid Cx lesion, 95% stenosed.  Mid Cx to Dist Cx lesion, 95% stenosed.  Prox LAD to Mid LAD lesion, 50% stenosed.  Ost Ramus lesion, 60% stenosed.  Prox Cx lesion, 95% stenosed.  Prox RCA-1 lesion, 75% stenosed.  Prox RCA-2 lesion, 80% stenosed.  Mid RCA lesion, 90% stenosed.  Dist RCA lesion, 60% stenosed.  RPDA lesion, 60% stenosed.  The left ventricular systolic function is normal.     CABG planned for this Friday.    Hypokalemia  Replace this morning and  recheck at 1400hrs.    Essential (primary) hypertension Still elevated.  On Amlodipine 10, bystolic 10.  Posta cath SCr WNL.  Restart ARB with irbesartan 300(Diovan 320 is home med)   Tobacco abuse  Nicotine patch.  Cessation discussed.    Back Pain-herniated disk  Continue prednisone taper initiated by PCP.    A1C 5.6.   LOS: 1 day    HAGER, BRYAN PA-C 04/16/2015 8:17 AM  The patient was seen, examined and discussed with Wilburt Finlay, PA-C and I agree with the above.   A very pleasant 50 year old gentleman who presented with chest pain that was pressure-like retrosternal pain started at 3 AM. The patient has history of hyperlipidemia  intolerant to multiple statins including Lipitor, Crestor and Zocor, history of hypertension and exertional chest pain in the last few weeks. The patient has positive family history for premature coronary artery disease.  His left cardia cath yesterday showed diffuse 3 vessel disease, he was evaluated by CT surgery and is scheduled for a CABG on Friday 04/18/15.  He is asymptomatic, we will continue aspirin, prior statin intolerance, we restarted if he doesn't tolerate, we will refer him to the lipid clinic after discharge for consideration of PC SK 9 inhibitors, continue nebivolol hold ACEI/ARB prior to the cath. Continue heparin drip and nitroglycerin drip.  Replace potassium.  Lars Masson 04/16/2015

## 2015-04-16 NOTE — Care Management Note (Addendum)
Case Management Note  Patient Details  Name: [name] MRN: [id number] Date of Birth: 1965-07-01  Subjective/Objective:     Pt is a very pleasant 50 year old gentleman who presented with chest pain. History of hyperlipidemia, hypertension and exertional chest pain in the last few weeks. His left cardiac cath yesterday showed diffuse 3 vessel disease, he was evaluated by CT surgery and is scheduled for a CABG on Friday 04/18/15. Pt is from home with family support of wife.               Action/Plan: Per speaking with pt- he has a Herniated Disc and pt may benefit from HHPT services once stable for d/c. CM will continue to monitor for additional needs.    Expected Discharge Date:                  Expected Discharge Plan:  Home w Home Health Services  In-House Referral:     Discharge planning Services  CM Consult  Post Acute Care Choice:  Home Health Choice offered to:     DME Arranged:    DME Agency:     HH Arranged:  PT HH Agency:     Status of Service:  In process, will continue to follow  Medicare Important Message Given:    Date Medicare IM Given:    Medicare IM give by:    Date Additional Medicare IM Given:    Additional Medicare Important Message give by:     If discussed at Long Length of Stay Meetings, dates discussed:    Additional Comments:  Gala Lewandowsky, RN 04/16/2015, 2:09 PM

## 2015-04-16 NOTE — Progress Notes (Signed)
ANTICOAGULATION CONSULT NOTE - Follow Up Consult  Pharmacy Consult for heparin Indication: Multi-vessel disease  Allergies  Allergen Reactions  . Claritin [Loratadine] Other (See Comments)    Makes allergies worse-"a lot worse"  . Codeine Nausea And Vomiting  . Vicodin [Hydrocodone-Acetaminophen] Nausea And Vomiting    "pretty much any codeine"    Patient Measurements: Height: 6' (182.9 cm) Weight: 238 lb 12.8 oz (108.319 kg) IBW/kg (Calculated) : 77.6 Heparin Dosing Weight: 101 kg  Vital Signs: Temp: 97.8 F (36.6 C) (01/25 1341) Temp Source: Oral (01/25 1341) BP: 129/80 mmHg (01/25 1341) Pulse Rate: 77 (01/25 1341)  Labs:  Recent Labs  04/15/15 1050 04/15/15 1816 04/16/15 0020 04/16/15 0850 04/16/15 1840  HGB 15.8  --  14.8  --   --   HCT 43.4  --  41.1  --   --   PLT 161  --  145*  --   --   LABPROT  --  14.1  --   --   --   INR  --  1.07  --   --   --   HEPARINUNFRC  --   --   --  <0.10* 0.24*  CREATININE 0.77  --  0.79  --   --   TROPONINI  --  0.63* 0.56* 0.40*  --     Estimated Creatinine Clearance: 138.9 mL/min (by C-G formula based on Cr of 0.79).   Assessment: 50 yo M admitted with CP. S/p cardiac cath which found multivessel disease, pending CABG 1/27. Heparin restarted last night. Heparin level was first undetectable on 1200 units/hr and now 0.24 on 1500 units/hr.  Hgb stable, plt 161 > 145K. No issues per RN  Goal of Therapy:  Heparin level 0.3-0.7 units/ml Monitor platelets by anticoagulation protocol: Yes   Plan:  Increase heparin gtt to 1,700 units/hr Check 6 hr HL Monitor daily HL, CBC, s/s of bleed  Enzo Bi, PharmD, [facility] Clinical Pharmacist Pager [phone] 04/16/2015 8:13 PM

## 2015-04-17 ENCOUNTER — Inpatient Hospital Stay (HOSPITAL_COMMUNITY): Payer: BLUE CROSS/BLUE SHIELD

## 2015-04-17 DIAGNOSIS — I25110 Atherosclerotic heart disease of native coronary artery with unstable angina pectoris: Secondary | ICD-10-CM

## 2015-04-17 DIAGNOSIS — I2510 Atherosclerotic heart disease of native coronary artery without angina pectoris: Secondary | ICD-10-CM

## 2015-04-17 LAB — COMPREHENSIVE METABOLIC PANEL
ALT: 25 U/L (ref 17–63)
AST: 24 U/L (ref 15–41)
Albumin: 3.9 g/dL (ref 3.5–5.0)
Alkaline Phosphatase: 84 U/L (ref 38–126)
Anion gap: 9 (ref 5–15)
BUN: 18 mg/dL (ref 6–20)
CO2: 25 mmol/L (ref 22–32)
Calcium: 9.2 mg/dL (ref 8.9–10.3)
Chloride: 103 mmol/L (ref 101–111)
Creatinine, Ser: 1 mg/dL (ref 0.61–1.24)
GFR calc Af Amer: >60 mL/min (ref >60)
GFR calc non Af Amer: >60 mL/min (ref >60)
Glucose, Bld: 187 mg/dL — ABNORMAL HIGH (ref 65–99)
Potassium: 3.6 mmol/L (ref 3.5–5.1)
Sodium: 137 mmol/L (ref 135–145)
Total Bilirubin: 0.8 mg/dL (ref 0.3–1.2)
Total Protein: 7.5 g/dL (ref 6.5–8.1)

## 2015-04-17 LAB — CBC
HEMATOCRIT: 41.3 % (ref 39.0–52.0)
HEMOGLOBIN: 14.9 g/dL (ref 13.0–17.0)
MCH: 30.3 pg (ref 26.0–34.0)
MCHC: 36.1 g/dL — AB (ref 30.0–36.0)
MCV: 83.9 fL (ref 78.0–100.0)
Platelets: 142 10*3/uL — ABNORMAL LOW (ref 150–400)
RBC: 4.92 MIL/uL (ref 4.22–5.81)
RDW: 12.9 % (ref 11.5–15.5)
WBC: 8.6 10*3/uL (ref 4.0–10.5)

## 2015-04-17 LAB — TYPE AND SCREEN
ABO/RH(D): B NEG
ANTIBODY SCREEN: NEGATIVE

## 2015-04-17 LAB — PROTIME-INR
INR: 1.02 (ref 0.00–1.49)
Prothrombin Time: 13.6 seconds (ref 11.6–15.2)

## 2015-04-17 LAB — HEPARIN LEVEL (UNFRACTIONATED)
Heparin Unfractionated: 0.33 IU/mL (ref 0.30–0.70)
Heparin Unfractionated: 0.48 IU/mL (ref 0.30–0.70)

## 2015-04-17 LAB — ABO/RH: ABO/RH(D): B NEG

## 2015-04-17 LAB — APTT: APTT: 59 s — AB (ref 24–37)

## 2015-04-17 MED ORDER — SODIUM CHLORIDE 0.9 % IV SOLN
INTRAVENOUS | Status: AC
  Administered 2015-04-18: 13:00:00 via INTRAVENOUS
  Administered 2015-04-18: 69.8 mL/h via INTRAVENOUS
  Filled 2015-04-17: qty 40

## 2015-04-17 MED ORDER — DEXMEDETOMIDINE HCL IN NACL 400 MCG/100ML IV SOLN
0.1000 ug/kg/h | INTRAVENOUS | Status: AC
  Administered 2015-04-18: 15:00:00 via INTRAVENOUS
  Administered 2015-04-18: .2 ug/kg/h via INTRAVENOUS
  Filled 2015-04-17: qty 100

## 2015-04-17 MED ORDER — EPINEPHRINE HCL 1 MG/ML IJ SOLN
0.0000 ug/min | INTRAVENOUS | Status: DC
  Filled 2015-04-17: qty 4

## 2015-04-17 MED ORDER — METOPROLOL TARTRATE 12.5 MG HALF TABLET
12.5000 mg | ORAL_TABLET | Freq: Once | ORAL | Status: AC
  Administered 2015-04-18: 12.5 mg via ORAL
  Filled 2015-04-17: qty 1

## 2015-04-17 MED ORDER — DEXTROSE 5 % IV SOLN
1.5000 g | INTRAVENOUS | Status: AC
  Administered 2015-04-18: 1.5 g via INTRAVENOUS
  Administered 2015-04-18: 750 g via INTRAVENOUS
  Filled 2015-04-17 (×2): qty 1.5

## 2015-04-17 MED ORDER — NITROGLYCERIN IN D5W 200-5 MCG/ML-% IV SOLN
2.0000 ug/min | INTRAVENOUS | Status: DC
  Filled 2015-04-17: qty 250

## 2015-04-17 MED ORDER — POTASSIUM CHLORIDE 2 MEQ/ML IV SOLN
80.0000 meq | INTRAVENOUS | Status: DC
  Filled 2015-04-17: qty 40

## 2015-04-17 MED ORDER — TEMAZEPAM 15 MG PO CAPS: 15.0000 mg | ORAL_CAPSULE | Freq: Once | ORAL | Status: DC | PRN

## 2015-04-17 MED ORDER — POTASSIUM CHLORIDE CRYS ER 20 MEQ PO TBCR
40.0000 meq | EXTENDED_RELEASE_TABLET | Freq: Once | ORAL | Status: AC
  Administered 2015-04-17: 40 meq via ORAL
  Filled 2015-04-17: qty 2

## 2015-04-17 MED ORDER — CHLORHEXIDINE GLUCONATE 4 % EX LIQD
60.0000 mL | Freq: Once | CUTANEOUS | Status: AC
  Administered 2015-04-18: 4 via TOPICAL
  Filled 2015-04-17: qty 60

## 2015-04-17 MED ORDER — SODIUM CHLORIDE 0.9 % IV SOLN
INTRAVENOUS | Status: DC
  Filled 2015-04-17: qty 30

## 2015-04-17 MED ORDER — PHENYLEPHRINE HCL 10 MG/ML IJ SOLN
30.0000 ug/min | INTRAVENOUS | Status: DC
  Filled 2015-04-17: qty 2

## 2015-04-17 MED ORDER — PLASMA-LYTE 148 IV SOLN
INTRAVENOUS | Status: AC
  Administered 2015-04-18: 500 mL
  Filled 2015-04-17: qty 2.5

## 2015-04-17 MED ORDER — MAGNESIUM SULFATE 50 % IJ SOLN
40.0000 meq | INTRAMUSCULAR | Status: DC
  Filled 2015-04-17: qty 10

## 2015-04-17 MED ORDER — CHLORHEXIDINE GLUCONATE 0.12 % MT SOLN: 15.0000 mL | OROMUCOSAL | Status: DC

## 2015-04-17 MED ORDER — DOPAMINE-DEXTROSE 3.2-5 MG/ML-% IV SOLN
0.0000 ug/kg/min | INTRAVENOUS | Status: DC
  Filled 2015-04-17 (×2): qty 250

## 2015-04-17 MED ORDER — SODIUM CHLORIDE 0.9 % IV SOLN
INTRAVENOUS | Status: DC
  Administered 2015-04-18: .7 [IU]/h via INTRAVENOUS
  Filled 2015-04-17: qty 2.5

## 2015-04-17 MED ORDER — BISACODYL 5 MG PO TBEC
5.0000 mg | DELAYED_RELEASE_TABLET | Freq: Once | ORAL | Status: AC
  Administered 2015-04-17: 5 mg via ORAL
  Filled 2015-04-17: qty 1

## 2015-04-17 MED ORDER — VANCOMYCIN HCL 10 G IV SOLR
1500.0000 mg | INTRAVENOUS | Status: AC
  Administered 2015-04-18: 1500 mg via INTRAVENOUS
  Filled 2015-04-17: qty 1500

## 2015-04-17 MED ORDER — DEXTROSE 5 % IV SOLN
750.0000 mg | INTRAVENOUS | Status: DC
  Filled 2015-04-17: qty 750

## 2015-04-17 MED ORDER — CHLORHEXIDINE GLUCONATE 4 % EX LIQD
60.0000 mL | Freq: Once | CUTANEOUS | Status: AC
  Administered 2015-04-17: 4 via TOPICAL
  Filled 2015-04-17: qty 60

## 2015-04-17 NOTE — Progress Notes (Signed)
CARDIAC REHAB PHASE I  Pt up ambulating independently in hallway, no complaints other than leg pain, states he feels better today. Pt states he has no questions regarding pre-op education at this time. Encouraged ambulation as tolerated. Will plan to follow post-op.   Joylene Grapes, RN, BSN 04/17/2015 1:45 PM

## 2015-04-17 NOTE — Progress Notes (Signed)
Subjective: No complaints.  Objective: Vital signs in last 24 hours: Temp:  [97.5 F (36.4 C)-97.8 F (36.6 C)] 97.5 F (36.4 C) (01/26 0500) Pulse Rate:  [58-77] 58 (01/26 0500) Resp:  [14] 14 (01/25 1341) BP: (129-166)/(80-94) 166/94 mmHg (01/26 0500) SpO2:  [96 %-99 %] 99 % (01/26 0500) Weight:  [236 lb 9.6 oz (107.321 kg)] 236 lb 9.6 oz (107.321 kg) (01/26 0500) Last BM Date: 04/15/15  Intake/Output from previous day: 01/25 0701 - 01/26 0700 In: 723 [P.O.:720; I.V.:3] Out: 1000 [Urine:1000] Intake/Output this shift: Total I/O In: 240 [P.O.:240] Out: -   Medications Scheduled Meds: . [START ON 04/18/2015] aminocaproic acid (AMICAR) for OHS   Intravenous To OR  . amLODipine  10 mg Oral Daily  . aspirin EC  81 mg Oral Daily  . atorvastatin  80 mg Oral q1800  . bisacodyl  5 mg Oral Once  . [START ON 04/18/2015] cefUROXime (ZINACEF)  IV  1.5 g Intravenous To OR  . [START ON 04/18/2015] cefUROXime (ZINACEF)  IV  750 mg Intravenous To OR  . chlorhexidine  60 mL Topical Once   And  . [START ON 04/18/2015] chlorhexidine  60 mL Topical Once  . chlorhexidine  15 mL Mouth/Throat To SS-Surg  . [START ON 04/18/2015] dexmedetomidine  0.1-0.7 mcg/kg/hr Intravenous To OR  . [START ON 04/18/2015] DOPamine  0-10 mcg/kg/min Intravenous To OR  . [START ON 04/18/2015] epinephrine  0-10 mcg/min Intravenous To OR  . FLUoxetine  30 mg Oral Daily  . [START ON 04/18/2015] heparin-papaverine-plasmalyte irrigation   Irrigation To OR  . [START ON 04/18/2015] heparin 30,000 units/NS 1000 mL solution for CELLSAVER   Other To OR  . [START ON 04/18/2015] insulin (NOVOLIN-R) infusion   Intravenous To OR  . irbesartan  300 mg Oral Daily  . [START ON 04/18/2015] magnesium sulfate  40 mEq Other To OR  . [START ON 04/18/2015] metoprolol tartrate  12.5 mg Oral Once  . nebivolol  10 mg Oral Daily  . nicotine  14 mg Transdermal Q24H  . [START ON 04/18/2015] nitroGLYCERIN  2-200 mcg/min Intravenous To OR  .  [START ON 04/18/2015] phenylephrine (NEO-SYNEPHRINE) Adult infusion  30-200 mcg/min Intravenous To OR  . pneumococcal 23 valent vaccine  0.5 mL Intramuscular Tomorrow-1000  . [START ON 04/18/2015] potassium chloride  80 mEq Other To OR  . potassium chloride  40 mEq Oral Once  . [START ON 04/18/2015] predniSONE  40 mg Oral Q breakfast   Followed by  . [START ON 04/20/2015] predniSONE  30 mg Oral Q breakfast   Followed by  . [START ON 04/22/2015] predniSONE  20 mg Oral Q breakfast   Followed by  . [START ON 04/24/2015] predniSONE  10 mg Oral Q breakfast  . sodium chloride flush  3 mL Intravenous Q12H  . sodium chloride flush  3 mL Intravenous Q12H  . tamsulosin  0.4 mg Oral Daily  . [START ON 04/18/2015] vancomycin  1,500 mg Intravenous To OR   Continuous Infusions: . heparin 1,700 Units/hr (04/17/15 0511)  . nitroGLYCERIN 10 mcg/min (04/15/15 1721)   PRN Meds:.sodium chloride, sodium chloride, acetaminophen, nitroGLYCERIN, ondansetron (ZOFRAN) IV, sodium chloride flush, sodium chloride flush, temazepam, traMADol  PE: General appearance: alert, cooperative and no distress Lungs: clear to auscultation bilaterally Heart: regular rate and rhythm, S1, S2 normal, no murmur, click, rub or gallop Extremities: No LEE Pulses: 2+ and symmetric Skin: warm and dry.  Right w=rist cath site a little tender/sore.  No obvious  ecchymosis or hematoma Neurologic: Grossly normal  Lab Results:   Recent Labs  04/15/15 1050 04/16/15 0020 04/17/15 0220  WBC 10.2 10.2 8.6  HGB 15.8 14.8 14.9  HCT 43.4 41.1 41.3  PLT 161 145* 142*   BMET  Recent Labs  04/15/15 1050 04/16/15 0020 04/16/15 1515  NA 140 136  --   K 3.3* 2.8* 3.4*  CL 103 100*  --   CO2 24 23  --   GLUCOSE 112* 132*  --   BUN 13 14  --   CREATININE 0.77 0.79  --   CALCIUM 9.5 8.8*  --    PT/INR  Recent Labs  04/15/15 1816  LABPROT 14.1  INR 1.07   Cholesterol  Recent Labs  04/16/15 0020  CHOL 243*   Cardiac  Enzymes Invalid input(s): TROPONIN,  CKMB  Studies/Results: @   Assessment/Plan   Principal Problem:   NSTEMI (non-ST elevated myocardial infarction) (HCC), Troponin max-0.63 SP LHC revealing: Conclusion     Mid Cx lesion, 95% stenosed.  Mid Cx to Dist Cx lesion, 95% stenosed.  Prox LAD to Mid LAD lesion, 50% stenosed.  Ost Ramus lesion, 60% stenosed.  Prox Cx lesion, 95% stenosed.  Prox RCA-1 lesion, 75% stenosed.  Prox RCA-2 lesion, 80% stenosed.  Mid RCA lesion, 90% stenosed.  Dist RCA lesion, 60% stenosed.  RPDA lesion, 60% stenosed.  The left ventricular systolic function is normal.     CABG planned for this Friday.    Hypokalemia  Replace this morning and recheck at 1400hrs.    Essential (primary) hypertension Still elevated.  On Amlodipine 10, bystolic 10.  Posta cath SCr WNL.  Restart ARB with irbesartan 300(Diovan 320 is home med)   Tobacco abuse  Nicotine patch.  Cessation discussed.    Back Pain-herniated disk  Continue prednisone taper initiated by PCP.    A1C 5.6.  A very pleasant 50 year old gentleman who presented with chest pain that was pressure-like retrosternal pain started at 3 AM. The patient has history of hyperlipidemia intolerant to multiple statins including Lipitor, Crestor and Zocor, history of hypertension and exertional chest pain in the last few weeks. The patient has positive family history for premature coronary artery disease.  His left cardia cath yesterday showed diffuse 3 vessel disease, he was evaluated by CT surgery and is scheduled for a CABG on Friday 04/18/15.  He is asymptomatic, we will continue aspirin, prior statin intolerance, we restarted if he doesn't tolerate, we will refer him to the lipid clinic after discharge for consideration of PC SK 9 inhibitors, continue nebivolol hold ACEI/ARB prior to the cath. Continue heparin drip and nitroglycerin drip.  Replace potassium, he is still hypokalemic, today  3.4.  Jeffery Masson, MD 04/17/2015

## 2015-04-17 NOTE — Progress Notes (Signed)
VASCULAR LAB PRELIMINARY  PRELIMINARY  PRELIMINARY  PRELIMINARY  Pre-op Cardiac Surgery  Carotid Findings: Bilateral:  1-39% ICA stenosis.  Vertebral artery flow is antegrade.      Upper Extremity Right Left  Brachial Pressures 160 Triphasic 150 Triphasic  Radial Waveforms Triphasic Triphasic  Ulnar Waveforms Triphasic Triphasic  Palmar Arch (Allen's Test) Normal Normal   Findings:  Doppler waveforms remained normal bilaterally with both radial and ulnar compressions.    Lower  Extremity Right Left  Dorsalis Pedis 189 Triphasic 201 triphasic  Posterior Tibial 197 Triphasic 198 Triphasic  Ankle/Brachial Indices 1.23 1.34    Findings:  ABIs and Doppler waverforms are within normal limits bilaterallt at rest.   Kymberlee Viger, RVS 04/17/2015, 10:57 AM

## 2015-04-17 NOTE — Progress Notes (Signed)
ANTICOAGULATION CONSULT NOTE - Follow Up Consult  Pharmacy Consult for heparin Indication: Multi-vessel disease  Allergies  Allergen Reactions  . Claritin [Loratadine] Other (See Comments)    Makes allergies worse-"a lot worse"  . Codeine Nausea And Vomiting  . Vicodin [Hydrocodone-Acetaminophen] Nausea And Vomiting    "pretty much any codeine"    Patient Measurements: Height: 6' (182.9 cm) Weight: 236 lb 9.6 oz (107.321 kg) IBW/kg (Calculated) : 77.6 Heparin Dosing Weight: 101 kg  Vital Signs: Temp: 97.5 F (36.4 C) (01/26 0500) Temp Source: Oral (01/26 0500) BP: 166/94 mmHg (01/26 0500) Pulse Rate: 58 (01/26 0500)  Labs:  Recent Labs  04/15/15 1050 04/15/15 1816 04/16/15 0020  04/16/15 0850 04/16/15 1840 04/17/15 0218 04/17/15 0220 04/17/15 1200  HGB 15.8  --  14.8  --   --   --   --  14.9  --   HCT 43.4  --  41.1  --   --   --   --  41.3  --   PLT 161  --  145*  --   --   --   --  142*  --   APTT  --   --   --   --   --   --   --   --  59*  LABPROT  --  14.1  --   --   --   --   --   --  13.6  INR  --  1.07  --   --   --   --   --   --  1.02  HEPARINUNFRC  --   --   --   < > <0.10* 0.24* 0.33  --  0.48  CREATININE 0.77  --  0.79  --   --   --   --   --  1.00  TROPONINI  --  0.63* 0.56*  --  0.40*  --   --   --   --   < > = values in this interval not displayed.  Estimated Creatinine Clearance: 110.6 mL/min (by C-G formula based on Cr of 1).   Assessment: 50 yo Griffin admitted with CP. S/p cardiac cath which found multivessel disease, pending CABG 1/27. Recheck heparin level = 0.48, therapeutic on 1700 units/hr   Goal of Therapy:  Heparin level 0.3-0.7 units/ml Monitor platelets by anticoagulation protocol: Yes   Plan:  Continue heparin at 1700 units / hr Monitor daily HL, CBC, s/s of bleed  Bayard Hugger, PharmD, BCPS  Clinical Pharmacist  Pager: [phone]   04/17/2015 1:34 PM

## 2015-04-17 NOTE — Progress Notes (Signed)
ANTICOAGULATION CONSULT NOTE - Follow Up Consult  Pharmacy Consult for heparin Indication: Multi-vessel disease  Allergies  Allergen Reactions  . Claritin [Loratadine] Other (See Comments)    Makes allergies worse-"a lot worse"  . Codeine Nausea And Vomiting  . Vicodin [Hydrocodone-Acetaminophen] Nausea And Vomiting    "pretty much any codeine"    Patient Measurements: Height: 6' (182.9 cm) Weight: 238 lb 12.8 oz (108.319 kg) IBW/kg (Calculated) : 77.6 Heparin Dosing Weight: 101 kg  Vital Signs: Temp: 97.8 F (36.6 C) (01/25 2059) Temp Source: Oral (01/25 2059) BP: 133/89 mmHg (01/25 2059) Pulse Rate: 73 (01/25 2059)  Labs:  Recent Labs  04/15/15 1050 04/15/15 1816 04/16/15 0020 04/16/15 0850 04/16/15 1840 04/17/15 0218 04/17/15 0220  HGB 15.8  --  14.8  --   --   --  14.9  HCT 43.4  --  41.1  --   --   --  41.3  PLT 161  --  145*  --   --   --  142*  LABPROT  --  14.1  --   --   --   --   --   INR  --  1.07  --   --   --   --   --   HEPARINUNFRC  --   --   --  <0.10* 0.24* 0.33  --   CREATININE 0.77  --  0.79  --   --   --   --   TROPONINI  --  0.63* 0.56* 0.40*  --   --   --     Estimated Creatinine Clearance: 138.9 mL/min (by C-G formula based on Cr of 0.79).   Assessment: 50 yo M admitted with CP. S/p cardiac cath which found multivessel disease, pending CABG 1/27. Heparin restarted last night.  Heparin level now therapeutic  Goal of Therapy:  Heparin level 0.3-0.7 units/ml Monitor platelets by anticoagulation protocol: Yes   Plan:  Continue heparin at 1700 units / hr Monitor daily HL, CBC, s/s of bleed  Thank you Okey Regal, PharmD [phone] 04/17/2015 2:51 AM

## 2015-04-17 NOTE — Anesthesia Preprocedure Evaluation (Addendum)
Anesthesia Evaluation  Patient identified by MRN, date of birth, ID band Patient awake    Reviewed: Allergy & Precautions, NPO status , Patient's Chart, lab work & pertinent test results  Airway Mallampati: III  TM Distance: <3 FB   Mouth opening: Limited Mouth Opening  Dental  (+) Teeth Intact, Dental Advisory Given   Pulmonary Current Smoker,    breath sounds clear to auscultation + decreased breath sounds      Cardiovascular hypertension, Pt. on medications + Past MI   Rhythm:Regular Rate:Normal     Neuro/Psych PSYCHIATRIC DISORDERS Anxiety Depression negative neurological ROS  negative psych ROS   GI/Hepatic negative GI ROS, Neg liver ROS,   Endo/Other  negative endocrine ROS  Renal/GU negative Renal ROS     Musculoskeletal negative musculoskeletal ROS (+)   Abdominal   Peds  Hematology negative hematology ROS (+)   Anesthesia Other Findings   Reproductive/Obstetrics                           Anesthesia Physical Anesthesia Plan  ASA: IV  Anesthesia Plan: General   Post-op Pain Management:    Induction: Intravenous  Airway Management Planned: Oral ETT  Additional Equipment: Arterial line, PA Cath, Ultrasound Guidance Line Placement, TEE and CVP  Intra-op Plan:   Post-operative Plan: Post-operative intubation/ventilation  Informed Consent: I have reviewed the patients History and Physical, chart, labs and discussed the procedure including the risks, benefits and alternatives for the proposed anesthesia with the patient or authorized representative who has indicated his/her understanding and acceptance.   Dental advisory given  Plan Discussed with: CRNA  Anesthesia Plan Comments:         Anesthesia Quick Evaluation

## 2015-04-17 NOTE — Progress Notes (Signed)
Patient ID: [name], male   DOB: Aug 29, 1965, 50 y.o.   MRN: [id number]      [address]       [place] [address]             [phone]                 2 Days Post-Op Procedure(s) (LRB): Left Heart Cath and Coronary Angiography ([name])  LOS: 2 days   Subjective: No chest pain today  Objective: Vital signs in last 24 hours: Patient Vitals for the past 24 hrs:  BP Temp Temp src Pulse SpO2 Weight  04/17/15 0500 (!) 166/94 mmHg 97.5 F (36.4 C) Oral (!) 58 99 % 236 lb 9.6 oz (107.321 kg)  04/16/15 2059 133/89 mmHg 97.8 F (36.6 C) Oral 73 96 % -    Filed Weights   04/15/15 1026 04/16/15 0310 04/17/15 0500  Weight: 244 lb (110.678 kg) 238 lb 12.8 oz (108.319 kg) 236 lb 9.6 oz (107.321 kg)    Hemodynamic parameters for last 24 hours:    Intake/Output from previous day: 01/25 0701 - 01/26 0700 In: 723 [P.O.:720; I.V.:3] Out: 1000 [Urine:1000] Intake/Output this shift: Total I/O In: 600 [P.O.:600] Out: -   Scheduled Meds: . [START ON 04/18/2015] aminocaproic acid (AMICAR) for OHS   Intravenous To OR  . amLODipine  10 mg Oral Daily  . aspirin EC  81 mg Oral Daily  . atorvastatin  80 mg Oral q1800  . [START ON 04/18/2015] cefUROXime (ZINACEF)  IV  1.5 g Intravenous To OR  . [START ON 04/18/2015] cefUROXime (ZINACEF)  IV  750 mg Intravenous To OR  . chlorhexidine  60 mL Topical Once   And  . [START ON 04/18/2015] chlorhexidine  60 mL Topical Once  . chlorhexidine  15 mL Mouth/Throat To SS-Surg  . [START ON 04/18/2015] dexmedetomidine  0.1-0.7 mcg/kg/hr Intravenous To OR  . [START ON 04/18/2015] DOPamine  0-10 mcg/kg/min Intravenous To OR  . [START ON 04/18/2015] epinephrine  0-10 mcg/min Intravenous To OR  . FLUoxetine  30 mg Oral Daily  . [START ON 04/18/2015] heparin-papaverine-plasmalyte irrigation   Irrigation To OR  . [START ON 04/18/2015] heparin 30,000 units/NS 1000 mL solution for CELLSAVER   Other To OR  . [START ON 04/18/2015] insulin  (NOVOLIN-R) infusion   Intravenous To OR  . irbesartan  300 mg Oral Daily  . [START ON 04/18/2015] magnesium sulfate  40 mEq Other To OR  . [START ON 04/18/2015] metoprolol tartrate  12.5 mg Oral Once  . nebivolol  10 mg Oral Daily  . nicotine  14 mg Transdermal Q24H  . [START ON 04/18/2015] nitroGLYCERIN  2-200 mcg/min Intravenous To OR  . [START ON 04/18/2015] phenylephrine (NEO-SYNEPHRINE) Adult infusion  30-200 mcg/min Intravenous To OR  . pneumococcal 23 valent vaccine  0.5 mL Intramuscular Tomorrow-1000  . [START ON 04/18/2015] potassium chloride  80 mEq Other To OR  . potassium chloride  40 mEq Oral Once  . [START ON 04/18/2015] predniSONE  40 mg Oral Q breakfast   Followed by  . [START ON 04/20/2015] predniSONE  30 mg Oral Q breakfast   Followed by  . [START ON 04/22/2015] predniSONE  20 mg Oral Q breakfast   Followed by  . [START ON 04/24/2015] predniSONE  10 mg Oral Q breakfast  . sodium chloride flush  3 mL Intravenous Q12H  . sodium chloride flush  3 mL Intravenous Q12H  . tamsulosin  0.4 mg Oral  Daily  . [START ON 04/18/2015] vancomycin  1,500 mg Intravenous To OR   Continuous Infusions: . heparin 1,700 Units/hr (04/17/15 0511)  . nitroGLYCERIN 10 mcg/min (04/15/15 1721)   PRN Meds:.sodium chloride, sodium chloride, acetaminophen, nitroGLYCERIN, ondansetron (ZOFRAN) IV, sodium chloride flush, sodium chloride flush, temazepam, traMADol  General appearance: alert, cooperative, appears older than stated age and no distress Neurologic: intact Heart: regular rate and rhythm, S1, S2 normal, no murmur, click, rub or gallop Lungs: clear to auscultation bilaterally Abdomen: soft, non-tender; bowel sounds normal; no masses,  no organomegaly Extremities: extremities normal, atraumatic, no cyanosis or edema and Homans sign is negative, no sign of DVT  Lab Results: CBC: Recent Labs  04/16/15 0020 04/17/15 0220  WBC 10.2 8.6  HGB 14.8 14.9  HCT 41.1 41.3  PLT 145* 142*   BMET:    Recent Labs  04/16/15 0020 04/16/15 1515 04/17/15 1200  NA 136  --  137  K 2.8* 3.4* 3.6  CL 100*  --  103  CO2 23  --  25  GLUCOSE 132*  --  187*  BUN 14  --  18  CREATININE 0.79  --  1.00  CALCIUM 8.8*  --  9.2    PT/INR:  Recent Labs  04/17/15 1200  LABPROT 13.6  INR 1.02     Radiology No results found.   Assessment/Plan: S/P Procedure(s) (LRB): Left Heart Cath and Coronary Angiography ([name])   The goals risks and alternatives of the planned surgical procedure CABG possible use of left radial artery have been discussed with the patient in detail. The risks of the procedure including death, infection, stroke, myocardial infarction, bleeding, blood transfusion have all been discussed specifically.The use of the left radial has also been discussed pro and con with potential neuro vascular compromise of left hand .   I have quoted Arlys John Austell a 3% of perioperative mortality and a complication rate as high as 35%. The patient's questions have been answered.Timoteo Houchins is willing to proceed with the planned procedure.  In addition to other potential risks and complications from the surgery, I have made the patient aware of the recent [facility] Advisory concerning the risk of infection by Myocobacterium chimaera related to the use of Stockert 3T heater-cooler equipment during cardiac surgery. I discussed with the patient the low risk of infection, as well as our compliance with the most current FDA recommendations to minimize infection and testing of all devices for contamination. The patient has been made aware of the limited alternatives to immediately replacing the current equipment. The patient has been informed regarding the risks associated with waiting to proceed with needed surgery and that such risks are greater than the risk of infection related to the use of the heater-cooler device. I did make the patient aware that after careful review of the patients having  cardiac surgery at [facility] we have no evidence that heater/cooler related infections have occurred at [facility]. We discussed that this is a slow-growing bacterium, such that it can take some period of time for symptoms to develop.   Delight Ovens MD 04/17/2015 6:36 PM

## 2015-04-18 ENCOUNTER — Inpatient Hospital Stay (HOSPITAL_COMMUNITY): Payer: BLUE CROSS/BLUE SHIELD | Admitting: Certified Registered Nurse Anesthetist

## 2015-04-18 ENCOUNTER — Encounter (HOSPITAL_COMMUNITY)
Admission: EM | Disposition: A | Discharge status: Home / Self Care | Payer: BLUE CROSS/BLUE SHIELD | Source: Home / Self Care | Attending: Cardiothoracic Surgery

## 2015-04-18 ENCOUNTER — Inpatient Hospital Stay (HOSPITAL_COMMUNITY): Payer: BLUE CROSS/BLUE SHIELD

## 2015-04-18 ENCOUNTER — Encounter (HOSPITAL_COMMUNITY): Payer: Self-pay | Admitting: Certified Registered Nurse Anesthetist

## 2015-04-18 ENCOUNTER — Inpatient Hospital Stay (HOSPITAL_COMMUNITY)
Admit: 2015-04-18 | Discharge: 2015-04-18 | Disposition: A | Discharge status: Home / Self Care | Payer: BLUE CROSS/BLUE SHIELD | Attending: Cardiothoracic Surgery | Admitting: Cardiothoracic Surgery

## 2015-04-18 DIAGNOSIS — Z951 Presence of aortocoronary bypass graft: Secondary | ICD-10-CM

## 2015-04-18 HISTORY — PX: TEE WITHOUT CARDIOVERSION: SHX5443

## 2015-04-18 HISTORY — PX: CORONARY ARTERY BYPASS GRAFT: SHX141

## 2015-04-18 HISTORY — PX: RADIAL ARTERY HARVEST: SHX5067

## 2015-04-18 LAB — POCT I-STAT, CHEM 8
BUN: 18 mg/dL (ref 6–20)
BUN: 13 mg/dL (ref 6–20)
BUN: 16 mg/dL (ref 6–20)
BUN: 15 mg/dL (ref 6–20)
BUN: 17 mg/dL (ref 6–20)
BUN: 16 mg/dL (ref 6–20)
BUN: 16 mg/dL (ref 6–20)
Calcium, Ion: 1.22 mmol/L (ref 1.12–1.23)
Calcium, Ion: 1.02 mmol/L — ABNORMAL LOW (ref 1.12–1.23)
Calcium, Ion: 1.04 mmol/L — ABNORMAL LOW (ref 1.12–1.23)
Calcium, Ion: 1 mmol/L — ABNORMAL LOW (ref 1.12–1.23)
Calcium, Ion: 1.05 mmol/L — ABNORMAL LOW (ref 1.12–1.23)
Calcium, Ion: 1.06 mmol/L — ABNORMAL LOW (ref 1.12–1.23)
Calcium, Ion: 1.22 mmol/L (ref 1.12–1.23)
Chloride: 103 mmol/L (ref 101–111)
Chloride: 100 mmol/L — ABNORMAL LOW (ref 101–111)
Chloride: 110 mmol/L (ref 101–111)
Chloride: 103 mmol/L (ref 101–111)
Chloride: 104 mmol/L (ref 101–111)
Chloride: 101 mmol/L (ref 101–111)
Chloride: 106 mmol/L (ref 101–111)
Creatinine, Ser: 0.8 mg/dL (ref 0.61–1.24)
Creatinine, Ser: 0.5 mg/dL — ABNORMAL LOW (ref 0.61–1.24)
Creatinine, Ser: 0.5 mg/dL — ABNORMAL LOW (ref 0.61–1.24)
Creatinine, Ser: 0.5 mg/dL — ABNORMAL LOW (ref 0.61–1.24)
Creatinine, Ser: 0.6 mg/dL — ABNORMAL LOW (ref 0.61–1.24)
Creatinine, Ser: 0.7 mg/dL (ref 0.61–1.24)
Creatinine, Ser: 0.8 mg/dL (ref 0.61–1.24)
Glucose, Bld: 96 mg/dL (ref 65–99)
Glucose, Bld: 127 mg/dL — ABNORMAL HIGH (ref 65–99)
Glucose, Bld: 95 mg/dL (ref 65–99)
Glucose, Bld: 127 mg/dL — ABNORMAL HIGH (ref 65–99)
Glucose, Bld: 143 mg/dL — ABNORMAL HIGH (ref 65–99)
Glucose, Bld: 151 mg/dL — ABNORMAL HIGH (ref 65–99)
Glucose, Bld: 110 mg/dL — ABNORMAL HIGH (ref 65–99)
HCT: 41 % (ref 39.0–52.0)
HCT: 30 % — ABNORMAL LOW (ref 39.0–52.0)
HCT: 34 % — ABNORMAL LOW (ref 39.0–52.0)
HCT: 28 % — ABNORMAL LOW (ref 39.0–52.0)
HCT: 31 % — ABNORMAL LOW (ref 39.0–52.0)
HCT: 31 % — ABNORMAL LOW (ref 39.0–52.0)
HCT: 33 % — ABNORMAL LOW (ref 39.0–52.0)
Hemoglobin: 13.9 g/dL (ref 13.0–17.0)
Hemoglobin: 10.2 g/dL — ABNORMAL LOW (ref 13.0–17.0)
Hemoglobin: 11.6 g/dL — ABNORMAL LOW (ref 13.0–17.0)
Hemoglobin: 9.5 g/dL — ABNORMAL LOW (ref 13.0–17.0)
Hemoglobin: 10.5 g/dL — ABNORMAL LOW (ref 13.0–17.0)
Hemoglobin: 10.5 g/dL — ABNORMAL LOW (ref 13.0–17.0)
Hemoglobin: 11.2 g/dL — ABNORMAL LOW (ref 13.0–17.0)
Potassium: 3.5 mmol/L (ref 3.5–5.1)
Potassium: 3 mmol/L — ABNORMAL LOW (ref 3.5–5.1)
Potassium: 4.1 mmol/L (ref 3.5–5.1)
Potassium: 4 mmol/L (ref 3.5–5.1)
Potassium: 4.5 mmol/L (ref 3.5–5.1)
Potassium: 3.5 mmol/L (ref 3.5–5.1)
Potassium: 4.2 mmol/L (ref 3.5–5.1)
Sodium: 141 mmol/L (ref 135–145)
Sodium: 139 mmol/L (ref 135–145)
Sodium: 144 mmol/L (ref 135–145)
Sodium: 133 mmol/L — ABNORMAL LOW (ref 135–145)
Sodium: 134 mmol/L — ABNORMAL LOW (ref 135–145)
Sodium: 137 mmol/L (ref 135–145)
Sodium: 139 mmol/L (ref 135–145)
TCO2: 24 mmol/L (ref 0–100)
TCO2: 20 mmol/L (ref 0–100)
TCO2: 28 mmol/L (ref 0–100)
TCO2: 26 mmol/L (ref 0–100)
TCO2: 26 mmol/L (ref 0–100)
TCO2: 23 mmol/L (ref 0–100)
TCO2: 24 mmol/L (ref 0–100)

## 2015-04-18 LAB — PROTIME-INR
INR: 1.44 (ref 0.00–1.49)
Prothrombin Time: 17.6 seconds — ABNORMAL HIGH (ref 11.6–15.2)

## 2015-04-18 LAB — CBC
HCT: 33.5 % — ABNORMAL LOW (ref 39.0–52.0)
HEMATOCRIT: 45.5 % (ref 39.0–52.0)
HEMATOCRIT: 33.2 % — AB (ref 39.0–52.0)
Hemoglobin: 16.7 g/dL (ref 13.0–17.0)
Hemoglobin: 11.7 g/dL — ABNORMAL LOW (ref 13.0–17.0)
Hemoglobin: 11.7 g/dL — ABNORMAL LOW (ref 13.0–17.0)
MCH: 31.3 pg (ref 26.0–34.0)
MCH: 30.5 pg (ref 26.0–34.0)
MCH: 30.2 pg (ref 26.0–34.0)
MCHC: 36.7 g/dL — ABNORMAL HIGH (ref 30.0–36.0)
MCHC: 35.2 g/dL (ref 30.0–36.0)
MCHC: 34.9 g/dL (ref 30.0–36.0)
MCV: 85.4 fL (ref 78.0–100.0)
MCV: 86.5 fL (ref 78.0–100.0)
MCV: 86.6 fL (ref 78.0–100.0)
PLATELETS: 203 10*3/uL (ref 150–400)
PLATELETS: 98 10*3/uL — AB (ref 150–400)
Platelets: 106 10*3/uL — ABNORMAL LOW (ref 150–400)
RBC: 5.33 MIL/uL (ref 4.22–5.81)
RBC: 3.84 MIL/uL — ABNORMAL LOW (ref 4.22–5.81)
RBC: 3.87 MIL/uL — ABNORMAL LOW (ref 4.22–5.81)
RDW: 13.2 % (ref 11.5–15.5)
RDW: 13.3 % (ref 11.5–15.5)
RDW: 13.3 % (ref 11.5–15.5)
WBC: 14.5 10*3/uL — AB (ref 4.0–10.5)
WBC: 14.3 10*3/uL — ABNORMAL HIGH (ref 4.0–10.5)
WBC: 12.4 10*3/uL — ABNORMAL HIGH (ref 4.0–10.5)

## 2015-04-18 LAB — POCT I-STAT 3, ART BLOOD GAS (G3+)
Acid-Base Excess: 2 mmol/L (ref 0.0–2.0)
Acid-base deficit: 2 mmol/L (ref 0.0–2.0)
Acid-base deficit: 4 mmol/L — ABNORMAL HIGH (ref 0.0–2.0)
Acid-base deficit: 4 mmol/L — ABNORMAL HIGH (ref 0.0–2.0)
Bicarbonate: 25.4 mEq/L — ABNORMAL HIGH (ref 20.0–24.0)
Bicarbonate: 27.5 mEq/L — ABNORMAL HIGH (ref 20.0–24.0)
Bicarbonate: 22.2 mEq/L (ref 20.0–24.0)
Bicarbonate: 22.1 mEq/L (ref 20.0–24.0)
O2 Saturation: 99 %
O2 Saturation: 100 %
O2 Saturation: 90 %
O2 Saturation: 89 %
Patient temperature: 37
Patient temperature: 37
TCO2: 27 mmol/L (ref 0–100)
TCO2: 29 mmol/L (ref 0–100)
TCO2: 23 mmol/L (ref 0–100)
TCO2: 23 mmol/L (ref 0–100)
pCO2 arterial: 51.5 mmHg — ABNORMAL HIGH (ref 35.0–45.0)
pCO2 arterial: 46.2 mmHg — ABNORMAL HIGH (ref 35.0–45.0)
pCO2 arterial: 42.3 mmHg (ref 35.0–45.0)
pCO2 arterial: 41.9 mmHg (ref 35.0–45.0)
pH, Arterial: 7.301 — ABNORMAL LOW (ref 7.350–7.450)
pH, Arterial: 7.382 (ref 7.350–7.450)
pH, Arterial: 7.327 — ABNORMAL LOW (ref 7.350–7.450)
pH, Arterial: 7.33 — ABNORMAL LOW (ref 7.350–7.450)
pO2, Arterial: 134 mmHg — ABNORMAL HIGH (ref 80.0–100.0)
pO2, Arterial: 490 mmHg — ABNORMAL HIGH (ref 80.0–100.0)
pO2, Arterial: 64 mmHg — ABNORMAL LOW (ref 80.0–100.0)
pO2, Arterial: 61 mmHg — ABNORMAL LOW (ref 80.0–100.0)

## 2015-04-18 LAB — BASIC METABOLIC PANEL
Anion gap: 14 (ref 5–15)
BUN: 17 mg/dL (ref 6–20)
CO2: 22 mmol/L (ref 22–32)
Calcium: 9 mg/dL (ref 8.9–10.3)
Chloride: 101 mmol/L (ref 101–111)
Creatinine, Ser: 0.82 mg/dL (ref 0.61–1.24)
GFR calc Af Amer: >60 mL/min (ref >60)
GFR calc non Af Amer: >60 mL/min (ref >60)
Glucose, Bld: 112 mg/dL — ABNORMAL HIGH (ref 65–99)
Potassium: 3.6 mmol/L (ref 3.5–5.1)
Sodium: 137 mmol/L (ref 135–145)

## 2015-04-18 LAB — PLATELET COUNT: Platelets: 104 10*3/uL — ABNORMAL LOW (ref 150–400)

## 2015-04-18 LAB — POCT I-STAT 4, (NA,K, GLUC, HGB,HCT)
Glucose, Bld: 111 mg/dL — ABNORMAL HIGH (ref 65–99)
HCT: 33 % — ABNORMAL LOW (ref 39.0–52.0)
Hemoglobin: 11.2 g/dL — ABNORMAL LOW (ref 13.0–17.0)
Potassium: 3.6 mmol/L (ref 3.5–5.1)
Sodium: 139 mmol/L (ref 135–145)

## 2015-04-18 LAB — CREATININE, SERUM
Creatinine, Ser: 0.91 mg/dL (ref 0.61–1.24)
GFR calc Af Amer: >60 mL/min (ref >60)
GFR calc non Af Amer: >60 mL/min (ref >60)

## 2015-04-18 LAB — HEPARIN LEVEL (UNFRACTIONATED): Heparin Unfractionated: >2.2 IU/mL — ABNORMAL HIGH (ref 0.30–0.70)

## 2015-04-18 LAB — MAGNESIUM
Magnesium: 2.3 mg/dL (ref 1.7–2.4)
Magnesium: 2.9 mg/dL — ABNORMAL HIGH (ref 1.7–2.4)

## 2015-04-18 LAB — HEMOGLOBIN A1C
Hgb A1c MFr Bld: 5.6 % (ref 4.8–5.6)
Mean Plasma Glucose: 114 mg/dL

## 2015-04-18 LAB — HEMOGLOBIN AND HEMATOCRIT, BLOOD
HCT: 29.1 % — ABNORMAL LOW (ref 39.0–52.0)
Hemoglobin: 10.6 g/dL — ABNORMAL LOW (ref 13.0–17.0)

## 2015-04-18 LAB — APTT: APTT: 29 s (ref 24–37)

## 2015-04-18 SURGERY — CORONARY ARTERY BYPASS GRAFTING (CABG)
Anesthesia: General | Site: Chest

## 2015-04-18 MED ORDER — INSULIN REGULAR BOLUS VIA INFUSION
0.0000 [IU] | Freq: Three times a day (TID) | INTRAVENOUS | Status: DC
  Filled 2015-04-18: qty 10

## 2015-04-18 MED ORDER — EPHEDRINE SULFATE 50 MG/ML IJ SOLN
INTRAMUSCULAR | Status: AC
  Filled 2015-04-18: qty 1

## 2015-04-18 MED ORDER — LACTATED RINGERS IV SOLN: INTRAVENOUS | Status: DC

## 2015-04-18 MED ORDER — ROCURONIUM BROMIDE 50 MG/5ML IV SOLN
INTRAVENOUS | Status: AC
  Filled 2015-04-18: qty 5

## 2015-04-18 MED ORDER — NITROGLYCERIN IN D5W 200-5 MCG/ML-% IV SOLN: 7.0000 ug/min | INTRAVENOUS | Status: DC

## 2015-04-18 MED ORDER — PROTAMINE SULFATE 10 MG/ML IV SOLN
INTRAVENOUS | Status: DC | PRN
  Administered 2015-04-18: 340 mg via INTRAVENOUS
  Administered 2015-04-18: 10 mg via INTRAVENOUS

## 2015-04-18 MED ORDER — LACTATED RINGERS IV SOLN: 500.0000 mL | Freq: Once | INTRAVENOUS | Status: DC | PRN

## 2015-04-18 MED ORDER — ALBUMIN HUMAN 5 % IV SOLN
INTRAVENOUS | Status: DC | PRN
  Administered 2015-04-18 (×2): via INTRAVENOUS

## 2015-04-18 MED ORDER — AMIODARONE HCL IN DEXTROSE 360-4.14 MG/200ML-% IV SOLN: 60.0000 mg/h | INTRAVENOUS | Status: DC

## 2015-04-18 MED ORDER — FENTANYL CITRATE (PF) 100 MCG/2ML IJ SOLN
INTRAMUSCULAR | Status: DC | PRN
  Administered 2015-04-18: 100 ug via INTRAVENOUS
  Administered 2015-04-18: 50 ug via INTRAVENOUS
  Administered 2015-04-18: 100 ug via INTRAVENOUS
  Administered 2015-04-18 (×2): 150 ug via INTRAVENOUS
  Administered 2015-04-18: 50 ug via INTRAVENOUS
  Administered 2015-04-18: 250 ug via INTRAVENOUS
  Administered 2015-04-18: 100 ug via INTRAVENOUS
  Administered 2015-04-18: 300 ug via INTRAVENOUS
  Administered 2015-04-18: 250 ug via INTRAVENOUS

## 2015-04-18 MED ORDER — AMIODARONE LOAD VIA INFUSION
150.0000 mg | Freq: Once | INTRAVENOUS | Status: AC
  Administered 2015-04-18: 150 mg via INTRAVENOUS
  Filled 2015-04-18: qty 83.34

## 2015-04-18 MED ORDER — FENTANYL CITRATE (PF) 250 MCG/5ML IJ SOLN
INTRAMUSCULAR | Status: AC
Start: 2015-04-18 — End: 2015-04-18
  Filled 2015-04-18: qty 25

## 2015-04-18 MED ORDER — HEPARIN SODIUM (PORCINE) 1000 UNIT/ML IJ SOLN
INTRAMUSCULAR | Status: AC
  Filled 2015-04-18: qty 1

## 2015-04-18 MED ORDER — HEPARIN SODIUM (PORCINE) 1000 UNIT/ML IJ SOLN
INTRAMUSCULAR | Status: DC | PRN
  Administered 2015-04-18: 3000 [IU] via INTRAVENOUS
  Administered 2015-04-18: 32000 [IU] via INTRAVENOUS

## 2015-04-18 MED ORDER — ACETAMINOPHEN 650 MG RE SUPP
650.0000 mg | Freq: Once | RECTAL | Status: AC
  Administered 2015-04-18: 650 mg via RECTAL

## 2015-04-18 MED ORDER — CHLORHEXIDINE GLUCONATE 0.12 % MT SOLN
15.0000 mL | OROMUCOSAL | Status: AC
  Administered 2015-04-18: 15 mL via OROMUCOSAL

## 2015-04-18 MED ORDER — DEXTROSE 5 % IV SOLN
20.0000 mg | INTRAVENOUS | Status: DC | PRN
  Administered 2015-04-18: 30 ug/min via INTRAVENOUS

## 2015-04-18 MED ORDER — FAMOTIDINE IN NACL 20-0.9 MG/50ML-% IV SOLN
20.0000 mg | Freq: Two times a day (BID) | INTRAVENOUS | Status: DC
  Administered 2015-04-18: 20 mg via INTRAVENOUS

## 2015-04-18 MED ORDER — METOPROLOL TARTRATE 12.5 MG HALF TABLET: 12.5000 mg | ORAL_TABLET | Freq: Two times a day (BID) | ORAL | Status: DC

## 2015-04-18 MED ORDER — SODIUM CHLORIDE 0.9 % IJ SOLN
INTRAMUSCULAR | Status: AC
  Filled 2015-04-18: qty 20

## 2015-04-18 MED ORDER — ALBUTEROL SULFATE HFA 108 (90 BASE) MCG/ACT IN AERS
INHALATION_SPRAY | RESPIRATORY_TRACT | Status: DC | PRN
  Administered 2015-04-18: 6 via RESPIRATORY_TRACT

## 2015-04-18 MED ORDER — GLYCOPYRROLATE 0.2 MG/ML IJ SOLN
INTRAMUSCULAR | Status: DC | PRN
  Administered 2015-04-18 (×2): 0.2 mg via INTRAVENOUS

## 2015-04-18 MED ORDER — PROTAMINE SULFATE 10 MG/ML IV SOLN
INTRAVENOUS | Status: AC
  Filled 2015-04-18: qty 10

## 2015-04-18 MED ORDER — MORPHINE SULFATE (PF) 2 MG/ML IV SOLN: 1.0000 mg | INTRAVENOUS | Status: AC | PRN

## 2015-04-18 MED ORDER — AMIODARONE HCL IN DEXTROSE 360-4.14 MG/200ML-% IV SOLN
30.0000 mg/h | INTRAVENOUS | Status: DC
  Administered 2015-04-18: 60 mg/h via INTRAVENOUS
  Filled 2015-04-18 (×2): qty 200

## 2015-04-18 MED ORDER — PROPOFOL 10 MG/ML IV BOLUS
INTRAVENOUS | Status: AC
Start: 2015-04-18 — End: 2015-04-18
  Filled 2015-04-18: qty 40

## 2015-04-18 MED ORDER — SODIUM CHLORIDE 0.9% FLUSH
3.0000 mL | Freq: Two times a day (BID) | INTRAVENOUS | Status: DC
  Administered 2015-04-19 – 2015-04-20 (×2): 3 mL via INTRAVENOUS
  Administered 2015-04-20: 10 mL via INTRAVENOUS

## 2015-04-18 MED ORDER — POTASSIUM CHLORIDE 10 MEQ/50ML IV SOLN
10.0000 meq | Freq: Once | INTRAVENOUS | Status: AC
Start: 2015-04-18 — End: 2015-04-18
  Administered 2015-04-18: 10 meq via INTRAVENOUS

## 2015-04-18 MED ORDER — SODIUM CHLORIDE 0.9 % IV SOLN
1.0000 g/h | INTRAVENOUS | Status: DC
  Filled 2015-04-18: qty 20

## 2015-04-18 MED ORDER — PANTOPRAZOLE SODIUM 40 MG PO TBEC
40.0000 mg | DELAYED_RELEASE_TABLET | Freq: Every day | ORAL | Status: DC
  Administered 2015-04-19 – 2015-04-20 (×2): 40 mg via ORAL
  Filled 2015-04-18 (×2): qty 1

## 2015-04-18 MED ORDER — LACTATED RINGERS IV SOLN
INTRAVENOUS | Status: DC | PRN
  Administered 2015-04-18: 06:00:00 via INTRAVENOUS

## 2015-04-18 MED ORDER — MIDAZOLAM HCL 2 MG/2ML IJ SOLN
INTRAMUSCULAR | Status: AC
  Filled 2015-04-18: qty 2

## 2015-04-18 MED ORDER — HEMOSTATIC AGENTS (NO CHARGE) OPTIME
TOPICAL | Status: DC | PRN
  Administered 2015-04-18: 1 via TOPICAL

## 2015-04-18 MED ORDER — SODIUM CHLORIDE 0.9% FLUSH: 3.0000 mL | INTRAVENOUS | Status: DC | PRN

## 2015-04-18 MED ORDER — ACETAMINOPHEN 500 MG PO TABS
1000.0000 mg | ORAL_TABLET | Freq: Four times a day (QID) | ORAL | Status: DC
  Administered 2015-04-19 – 2015-04-21 (×8): 1000 mg via ORAL
  Filled 2015-04-18 (×6): qty 2

## 2015-04-18 MED ORDER — MIDAZOLAM HCL 10 MG/2ML IJ SOLN
INTRAMUSCULAR | Status: AC
  Filled 2015-04-18: qty 2

## 2015-04-18 MED ORDER — ASPIRIN 81 MG PO CHEW: 324.0000 mg | CHEWABLE_TABLET | Freq: Every day | ORAL | Status: DC

## 2015-04-18 MED ORDER — MAGNESIUM SULFATE 4 GM/100ML IV SOLN
4.0000 g | Freq: Once | INTRAVENOUS | Status: AC
  Administered 2015-04-18: 4 g via INTRAVENOUS
  Filled 2015-04-18: qty 100

## 2015-04-18 MED ORDER — SODIUM CHLORIDE 0.9 % IJ SOLN
OROMUCOSAL | Status: DC | PRN
  Administered 2015-04-18 (×3): 4 mL via TOPICAL

## 2015-04-18 MED ORDER — METOPROLOL TARTRATE 1 MG/ML IV SOLN: 2.5000 mg | INTRAVENOUS | Status: DC | PRN

## 2015-04-18 MED ORDER — LACTATED RINGERS IV SOLN
INTRAVENOUS | Status: DC | PRN
  Administered 2015-04-18: 07:00:00 via INTRAVENOUS

## 2015-04-18 MED ORDER — POTASSIUM CHLORIDE 10 MEQ/50ML IV SOLN
10.0000 meq | INTRAVENOUS | Status: AC
  Administered 2015-04-18 (×3): 10 meq via INTRAVENOUS

## 2015-04-18 MED ORDER — ROCURONIUM BROMIDE 100 MG/10ML IV SOLN
INTRAVENOUS | Status: DC | PRN
  Administered 2015-04-18 (×2): 50 mg via INTRAVENOUS
  Administered 2015-04-18: 100 mg via INTRAVENOUS
  Administered 2015-04-18 (×4): 50 mg via INTRAVENOUS

## 2015-04-18 MED ORDER — PROPOFOL 10 MG/ML IV BOLUS
INTRAVENOUS | Status: AC
  Filled 2015-04-18: qty 20

## 2015-04-18 MED ORDER — MIDAZOLAM HCL 2 MG/2ML IJ SOLN: 2.0000 mg | INTRAMUSCULAR | Status: DC | PRN

## 2015-04-18 MED ORDER — ARTIFICIAL TEARS OP OINT
TOPICAL_OINTMENT | OPHTHALMIC | Status: AC
  Filled 2015-04-18: qty 3.5

## 2015-04-18 MED ORDER — BISACODYL 10 MG RE SUPP
10.0000 mg | Freq: Every day | RECTAL | Status: DC
Start: 2015-04-19 — End: 2015-04-21

## 2015-04-18 MED ORDER — AMIODARONE HCL 200 MG PO TABS: 200.0000 mg | ORAL_TABLET | Freq: Every day | ORAL | Status: DC

## 2015-04-18 MED ORDER — MORPHINE SULFATE (PF) 2 MG/ML IV SOLN
2.0000 mg | INTRAVENOUS | Status: DC | PRN
  Administered 2015-04-18 (×2): 2 mg via INTRAVENOUS
  Administered 2015-04-19 (×2): 4 mg via INTRAVENOUS
  Administered 2015-04-19: 2 mg via INTRAVENOUS
  Administered 2015-04-19 (×2): 4 mg via INTRAVENOUS
  Administered 2015-04-19: 2 mg via INTRAVENOUS
  Administered 2015-04-19: 4 mg via INTRAVENOUS
  Administered 2015-04-19: 2 mg via INTRAVENOUS
  Administered 2015-04-20: 4 mg via INTRAVENOUS
  Filled 2015-04-18 (×2): qty 2
  Filled 2015-04-18: qty 1
  Filled 2015-04-18: qty 2
  Filled 2015-04-18: qty 1
  Filled 2015-04-18: qty 2
  Filled 2015-04-18 (×2): qty 1
  Filled 2015-04-18: qty 2
  Filled 2015-04-18: qty 1
  Filled 2015-04-18: qty 2

## 2015-04-18 MED ORDER — DEXMEDETOMIDINE HCL IN NACL 200 MCG/50ML IV SOLN
0.0000 ug/kg/h | INTRAVENOUS | Status: DC
  Administered 2015-04-18: 0.5 ug/kg/h via INTRAVENOUS
  Filled 2015-04-18: qty 50

## 2015-04-18 MED ORDER — DOCUSATE SODIUM 100 MG PO CAPS
200.0000 mg | ORAL_CAPSULE | Freq: Every day | ORAL | Status: DC
  Administered 2015-04-19 – 2015-04-20 (×2): 200 mg via ORAL
  Filled 2015-04-18 (×2): qty 2

## 2015-04-18 MED ORDER — AMIODARONE HCL IN DEXTROSE 360-4.14 MG/200ML-% IV SOLN: 30.0000 mg/h | INTRAVENOUS | Status: DC

## 2015-04-18 MED ORDER — SODIUM CHLORIDE 0.45 % IV SOLN
INTRAVENOUS | Status: DC | PRN
  Administered 2015-04-18: 20 mL via INTRAVENOUS

## 2015-04-18 MED ORDER — AMIODARONE HCL 200 MG PO TABS
200.0000 mg | ORAL_TABLET | Freq: Two times a day (BID) | ORAL | Status: DC
  Administered 2015-04-19 – 2015-04-22 (×7): 200 mg via ORAL
  Filled 2015-04-18 (×7): qty 1

## 2015-04-18 MED ORDER — FENTANYL CITRATE (PF) 250 MCG/5ML IJ SOLN
INTRAMUSCULAR | Status: AC
  Filled 2015-04-18: qty 25

## 2015-04-18 MED ORDER — ACETAMINOPHEN 160 MG/5ML PO SOLN: 1000.0000 mg | Freq: Four times a day (QID) | ORAL | Status: DC

## 2015-04-18 MED ORDER — ALBUTEROL SULFATE HFA 108 (90 BASE) MCG/ACT IN AERS
INHALATION_SPRAY | RESPIRATORY_TRACT | Status: AC
  Filled 2015-04-18: qty 6.7

## 2015-04-18 MED ORDER — TRAMADOL HCL 50 MG PO TABS
50.0000 mg | ORAL_TABLET | ORAL | Status: DC | PRN
  Administered 2015-04-20 – 2015-04-21 (×3): 100 mg via ORAL
  Filled 2015-04-18 (×3): qty 2

## 2015-04-18 MED ORDER — METOPROLOL TARTRATE 25 MG/10 ML ORAL SUSPENSION: 12.5000 mg | Freq: Two times a day (BID) | ORAL | Status: DC

## 2015-04-18 MED ORDER — SODIUM CHLORIDE 0.9 % IV SOLN: INTRAVENOUS | Status: DC

## 2015-04-18 MED ORDER — DEXMEDETOMIDINE HCL IN NACL 200 MCG/50ML IV SOLN
INTRAVENOUS | Status: AC
  Filled 2015-04-18: qty 50

## 2015-04-18 MED ORDER — ACETAMINOPHEN 160 MG/5ML PO SOLN: 650.0000 mg | Freq: Once | ORAL | Status: AC

## 2015-04-18 MED ORDER — VANCOMYCIN HCL IN DEXTROSE 1-5 GM/200ML-% IV SOLN
1000.0000 mg | Freq: Once | INTRAVENOUS | Status: AC
  Administered 2015-04-18: 1000 mg via INTRAVENOUS
  Filled 2015-04-18: qty 200

## 2015-04-18 MED ORDER — STERILE WATER FOR INJECTION IJ SOLN
INTRAMUSCULAR | Status: AC
  Filled 2015-04-18: qty 10

## 2015-04-18 MED ORDER — PROTAMINE SULFATE 10 MG/ML IV SOLN
INTRAVENOUS | Status: AC
  Filled 2015-04-18: qty 25

## 2015-04-18 MED ORDER — ROCURONIUM BROMIDE 50 MG/5ML IV SOLN
INTRAVENOUS | Status: AC
  Filled 2015-04-18: qty 1

## 2015-04-18 MED ORDER — PROPOFOL 10 MG/ML IV BOLUS
INTRAVENOUS | Status: DC | PRN
  Administered 2015-04-18: 50 mg via INTRAVENOUS

## 2015-04-18 MED ORDER — DEXTROSE 5 % IV SOLN
1.5000 g | Freq: Two times a day (BID) | INTRAVENOUS | Status: AC
  Administered 2015-04-18 – 2015-04-20 (×4): 1.5 g via INTRAVENOUS
  Filled 2015-04-18 (×5): qty 1.5

## 2015-04-18 MED ORDER — PHENYLEPHRINE HCL 10 MG/ML IJ SOLN
10.0000 mg | INTRAVENOUS | Status: DC | PRN
  Administered 2015-04-18: 30 ug/min via INTRAVENOUS
  Administered 2015-04-18: 20 ug/min via INTRAVENOUS
  Administered 2015-04-18: 11:00:00 via INTRAVENOUS

## 2015-04-18 MED ORDER — PHENYLEPHRINE HCL 10 MG/ML IJ SOLN
0.0000 ug/min | INTRAMUSCULAR | Status: DC
  Administered 2015-04-18: 10 ug/min via INTRAVENOUS
  Filled 2015-04-18 (×2): qty 2

## 2015-04-18 MED ORDER — EPHEDRINE SULFATE 50 MG/ML IJ SOLN
INTRAMUSCULAR | Status: AC
Start: 2015-04-18 — End: 2015-04-18
  Filled 2015-04-18: qty 1

## 2015-04-18 MED ORDER — AMIODARONE HCL IN DEXTROSE 360-4.14 MG/200ML-% IV SOLN
60.0000 mg/h | INTRAVENOUS | Status: DC
  Administered 2015-04-18: 60 mg/h via INTRAVENOUS
  Filled 2015-04-18 (×3): qty 200

## 2015-04-18 MED ORDER — ALBUMIN HUMAN 5 % IV SOLN
250.0000 mL | INTRAVENOUS | Status: AC | PRN
  Administered 2015-04-18: 250 mL via INTRAVENOUS

## 2015-04-18 MED ORDER — MIDAZOLAM HCL 5 MG/5ML IJ SOLN
INTRAMUSCULAR | Status: DC | PRN
  Administered 2015-04-18: 4 mg via INTRAVENOUS
  Administered 2015-04-18 (×2): 2 mg via INTRAVENOUS
  Administered 2015-04-18: 3 mg via INTRAVENOUS
  Administered 2015-04-18: 5 mg via INTRAVENOUS

## 2015-04-18 MED ORDER — BISACODYL 5 MG PO TBEC
10.0000 mg | DELAYED_RELEASE_TABLET | Freq: Every day | ORAL | Status: DC
  Administered 2015-04-19 – 2015-04-20 (×2): 10 mg via ORAL
  Filled 2015-04-18 (×2): qty 2

## 2015-04-18 MED ORDER — 0.9 % SODIUM CHLORIDE (POUR BTL) OPTIME
TOPICAL | Status: DC | PRN
  Administered 2015-04-18: 4000 mL

## 2015-04-18 MED ORDER — OXYCODONE HCL 5 MG PO TABS
5.0000 mg | ORAL_TABLET | ORAL | Status: DC | PRN
  Administered 2015-04-19 – 2015-04-20 (×2): 10 mg via ORAL
  Filled 2015-04-18 (×2): qty 2

## 2015-04-18 MED ORDER — ASPIRIN EC 325 MG PO TBEC
325.0000 mg | DELAYED_RELEASE_TABLET | Freq: Every day | ORAL | Status: DC
  Administered 2015-04-19 – 2015-04-20 (×2): 325 mg via ORAL
  Filled 2015-04-18 (×2): qty 1

## 2015-04-18 MED ORDER — LACTATED RINGERS IV SOLN
INTRAVENOUS | Status: DC | PRN
  Administered 2015-04-18 (×2): via INTRAVENOUS

## 2015-04-18 MED ORDER — SUCCINYLCHOLINE CHLORIDE 20 MG/ML IJ SOLN
INTRAMUSCULAR | Status: AC
Start: 2015-04-18 — End: 2015-04-18
  Filled 2015-04-18: qty 1

## 2015-04-18 MED ORDER — ONDANSETRON HCL 4 MG/2ML IJ SOLN: 4.0000 mg | Freq: Four times a day (QID) | INTRAMUSCULAR | Status: DC | PRN

## 2015-04-18 MED ORDER — FENTANYL CITRATE (PF) 250 MCG/5ML IJ SOLN
INTRAMUSCULAR | Status: AC
  Filled 2015-04-18: qty 5

## 2015-04-18 MED ORDER — LEVALBUTEROL HCL 0.63 MG/3ML IN NEBU
0.6300 mg | INHALATION_SOLUTION | Freq: Three times a day (TID) | RESPIRATORY_TRACT | Status: DC
  Administered 2015-04-18 – 2015-04-20 (×5): 0.63 mg via RESPIRATORY_TRACT
  Filled 2015-04-18 (×6): qty 3

## 2015-04-18 MED ORDER — SODIUM CHLORIDE 0.9 % IV SOLN: 250.0000 mL | INTRAVENOUS | Status: DC

## 2015-04-18 MED ORDER — ROCURONIUM BROMIDE 50 MG/5ML IV SOLN
INTRAVENOUS | Status: AC
  Filled 2015-04-18: qty 2

## 2015-04-18 MED ORDER — SODIUM CHLORIDE 0.9 % IV SOLN
INTRAVENOUS | Status: DC
  Administered 2015-04-18: 2.9 [IU]/h via INTRAVENOUS
  Filled 2015-04-18 (×2): qty 2.5

## 2015-04-18 MED FILL — Sodium Chloride IV Soln 0.9%: INTRAVENOUS | Qty: 2000 | Status: AC

## 2015-04-18 MED FILL — Lidocaine HCl IV Inj 20 MG/ML: INTRAVENOUS | Qty: 5 | Status: AC

## 2015-04-18 MED FILL — Electrolyte-R (PH 7.4) Solution: INTRAVENOUS | Qty: 4000 | Status: AC

## 2015-04-18 MED FILL — Mannitol IV Soln 20%: INTRAVENOUS | Qty: 500 | Status: AC

## 2015-04-18 MED FILL — Sodium Bicarbonate IV Soln 8.4%: INTRAVENOUS | Qty: 50 | Status: AC

## 2015-04-18 MED FILL — Sodium Chloride Irrigation Soln 0.9%: Qty: 3000 | Status: AC

## 2015-04-18 MED FILL — Heparin Sodium (Porcine) Inj 1000 Unit/ML: INTRAMUSCULAR | Qty: 10 | Status: AC

## 2015-04-18 SURGICAL SUPPLY — 89 items
APPLIER CLIP 9.375 SM OPEN (CLIP)
BAG DECANTER FOR FLEXI CONT (MISCELLANEOUS) ×4 IMPLANT
BANDAGE ELASTIC 4 VELCRO ST LF (GAUZE/BANDAGES/DRESSINGS) ×8 IMPLANT
BANDAGE ELASTIC 6 VELCRO ST LF (GAUZE/BANDAGES/DRESSINGS) ×4 IMPLANT
BLADE 15 SAFETY STRL DISP (BLADE) ×4 IMPLANT
BLADE STERNUM SYSTEM 6 (BLADE) ×4 IMPLANT
BLADE SURG 15 STRL LF DISP TIS (BLADE) IMPLANT
BLADE SURG 15 STRL SS (BLADE)
BNDG GAUZE ELAST 4 BULKY (GAUZE/BANDAGES/DRESSINGS) ×8 IMPLANT
CANISTER SUCTION 2500CC (MISCELLANEOUS) ×4 IMPLANT
CATH CPB KIT GERHARDT (MISCELLANEOUS) ×4 IMPLANT
CATH THORACIC 28FR (CATHETERS) ×4 IMPLANT
CLIP APPLIE 9.375 SM OPEN (CLIP) IMPLANT
CLIP TI MEDIUM 24 (CLIP) ×4 IMPLANT
CLIP TI WIDE RED SMALL 24 (CLIP) ×4 IMPLANT
COVER MAYO STAND STRL (DRAPES) IMPLANT
CRADLE DONUT ADULT HEAD (MISCELLANEOUS) ×4 IMPLANT
DRAIN CHANNEL 28F RND 3/8 FF (WOUND CARE) ×4 IMPLANT
DRAPE CARDIOVASCULAR INCISE (DRAPES) ×1
DRAPE EXTREMITY T 121X128X90 (DRAPE) IMPLANT
DRAPE PROXIMA HALF (DRAPES) IMPLANT
DRAPE SLUSH/WARMER DISC (DRAPES) ×4 IMPLANT
DRAPE SRG 135X102X78XABS (DRAPES) ×3 IMPLANT
DRSG AQUACEL AG ADV 3.5X14 (GAUZE/BANDAGES/DRESSINGS) ×4 IMPLANT
ELECT BLADE 4.0 EZ CLEAN MEGAD (MISCELLANEOUS) ×4
ELECT REM PT RETURN 9FT ADLT (ELECTROSURGICAL) ×8
ELECTRODE BLDE 4.0 EZ CLN MEGD (MISCELLANEOUS) ×3 IMPLANT
ELECTRODE REM PT RTRN 9FT ADLT (ELECTROSURGICAL) ×6 IMPLANT
GAUZE SPONGE 4X4 12PLY STRL (GAUZE/BANDAGES/DRESSINGS) ×8 IMPLANT
GEL ULTRASOUND 20GR AQUASONIC (MISCELLANEOUS) ×4 IMPLANT
GLOVE BIO SURGEON STRL SZ 6 (GLOVE) ×8 IMPLANT
GLOVE BIO SURGEON STRL SZ 6.5 (GLOVE) ×28 IMPLANT
GLOVE BIO SURGEON STRL SZ7.5 (GLOVE) ×8 IMPLANT
GLOVE BIOGEL PI IND STRL 6 (GLOVE) ×9 IMPLANT
GLOVE BIOGEL PI IND STRL 6.5 (GLOVE) ×15 IMPLANT
GLOVE BIOGEL PI INDICATOR 6 (GLOVE) ×3
GLOVE BIOGEL PI INDICATOR 6.5 (GLOVE) ×5
GOWN STRL REUS W/ TWL LRG LVL3 (GOWN DISPOSABLE) ×33 IMPLANT
GOWN STRL REUS W/TWL LRG LVL3 (GOWN DISPOSABLE) ×11
HARMONIC SHEARS 14CM COAG (MISCELLANEOUS) ×4 IMPLANT
HEMOSTAT POWDER SURGIFOAM 1G (HEMOSTASIS) ×12 IMPLANT
HEMOSTAT SURGICEL 2X14 (HEMOSTASIS) ×4 IMPLANT
KIT BASIN OR (CUSTOM PROCEDURE TRAY) ×4 IMPLANT
KIT CATH SUCT 8FR (CATHETERS) ×4 IMPLANT
KIT ROOM TURNOVER OR (KITS) ×4 IMPLANT
KIT SUCTION CATH 14FR (SUCTIONS) ×8 IMPLANT
KIT VASOVIEW W/TROCAR VH 2000 (KITS) ×4 IMPLANT
LEAD PACING MYOCARDI (MISCELLANEOUS) ×4 IMPLANT
MARKER GRAFT CORONARY BYPASS (MISCELLANEOUS) ×12 IMPLANT
NS IRRIG 1000ML POUR BTL (IV SOLUTION) ×20 IMPLANT
PACK OPEN HEART (CUSTOM PROCEDURE TRAY) ×4 IMPLANT
PAD ARMBOARD 7.5X6 YLW CONV (MISCELLANEOUS) ×8 IMPLANT
PAD ELECT DEFIB RADIOL ZOLL (MISCELLANEOUS) ×4 IMPLANT
PENCIL BUTTON HOLSTER BLD 10FT (ELECTRODE) ×4 IMPLANT
PUNCH AORTIC ROT 4.0MM RCL 40 (MISCELLANEOUS) ×4 IMPLANT
PUNCH AORTIC ROTATE  4.5MM 8IN (MISCELLANEOUS) ×4 IMPLANT
SET CARDIOPLEGIA MPS 5001102 (MISCELLANEOUS) ×4 IMPLANT
SPONGE GAUZE 4X4 12PLY STER LF (GAUZE/BANDAGES/DRESSINGS) ×12 IMPLANT
SPONGE LAP 18X18 X RAY DECT (DISPOSABLE) ×16 IMPLANT
SPONGE LAP 4X18 X RAY DECT (DISPOSABLE) ×4 IMPLANT
SURGIFLO W/THROMBIN 8M KIT (HEMOSTASIS) ×4 IMPLANT
SUT BONE WAX W31G (SUTURE) ×4 IMPLANT
SUT PROLENE 3 0 SH1 36 (SUTURE) ×4 IMPLANT
SUT PROLENE 4 0 RB 1 (SUTURE) ×2
SUT PROLENE 4 0 TF (SUTURE) ×8 IMPLANT
SUT PROLENE 4-0 RB1 .5 CRCL 36 (SUTURE) ×6 IMPLANT
SUT PROLENE 5 0 C 1 36 (SUTURE) ×12 IMPLANT
SUT PROLENE 6 0 CC (SUTURE) ×16 IMPLANT
SUT PROLENE 7 0 BV 1 (SUTURE) ×4 IMPLANT
SUT PROLENE 7 0 BV1 MDA (SUTURE) ×4 IMPLANT
SUT PROLENE 8 0 BV175 6 (SUTURE) ×16 IMPLANT
SUT STEEL 6MS V (SUTURE) ×4 IMPLANT
SUT STEEL SZ 6 DBL 3X14 BALL (SUTURE) ×4 IMPLANT
SUT VIC AB 1 CTX 18 (SUTURE) ×8 IMPLANT
SUT VIC AB 2-0 CT1 27 (SUTURE) ×3
SUT VIC AB 2-0 CT1 TAPERPNT 27 (SUTURE) ×9 IMPLANT
SUT VIC AB 3-0 SH 27 (SUTURE)
SUT VIC AB 3-0 SH 27X BRD (SUTURE) IMPLANT
SUT VIC AB 3-0 X1 27 (SUTURE) ×12 IMPLANT
SUTURE E-PAK OPEN HEART (SUTURE) ×4 IMPLANT
SYR 50ML SLIP (SYRINGE) IMPLANT
SYSTEM SAHARA CHEST DRAIN ATS (WOUND CARE) ×4 IMPLANT
TAPE CLOTH SURG 4X10 WHT LF (GAUZE/BANDAGES/DRESSINGS) ×4 IMPLANT
TOWEL OR 17X24 6PK STRL BLUE (TOWEL DISPOSABLE) ×8 IMPLANT
TOWEL OR 17X26 10 PK STRL BLUE (TOWEL DISPOSABLE) ×8 IMPLANT
TRAY FOLEY IC TEMP SENS 16FR (CATHETERS) ×4 IMPLANT
TUBING INSUFFLATION (TUBING) ×4 IMPLANT
UNDERPAD 30X30 INCONTINENT (UNDERPADS AND DIAPERS) ×4 IMPLANT
WATER STERILE IRR 1000ML POUR (IV SOLUTION) ×8 IMPLANT

## 2015-04-18 NOTE — Progress Notes (Signed)
RT called by RN in reference to Patient desaturating.  Patient found on ventimask at 50% with SpO2 around 90%.  RT spoke with Dr. Laneta Simmers via phone and received verbal orders for Bipap PRN.  Patient now resting comfortably on Bipap.  RT will continue to follow.

## 2015-04-18 NOTE — Brief Op Note (Addendum)
       [address]       [place] [address]             [phone]   04/18/2015  1:13 PM  PATIENT:  [name]  50 y.o. male  PRE-OPERATIVE DIAGNOSIS:  CAD with non stemi mi POST-OPERATIVE DIAGNOSIS: same   PROCEDURE:  Procedure(s): CORONARY ARTERY BYPASS GRAFTING times four with Left internal mammary artery to LAD, Left radial artery to OM, Right greater  saphenous vein harvesting and  utilized to intermediate  and to PD TRANSESOPHAGEAL ECHOCARDIOGRAM (TEE) LEFT Arm RADIAL ARTERY HARVEST ENDOSCOPIC GREATER SAPHENOUS VEIN HARVEST (EVH) RIGHT THIGH  SURGEON:  Surgeon(s): Delight Ovens, MD  PHYSICIAN ASSISTANT: WAYNE GOLD PA-C; Edgar Frisk PA-S  ANESTHESIA:   general  PATIENT CONDITION:  ICU - intubated and hemodynamically stable.  PRE-OPERATIVE WEIGHT: 106kg  COMPLICATIONS: NO KNOWN

## 2015-04-18 NOTE — Progress Notes (Signed)
  Amiodarone Drug - Drug Interaction Consult Note  Recommendations: Continue current therapy  Amiodarone is metabolized by the cytochrome P450 system and therefore has the potential to cause many drug interactions. Amiodarone has an average plasma half-life of 50 days (range 20 to 100 days).   There is potential for drug interactions to occur several weeks or months after stopping treatment and the onset of drug interactions may be slow after initiating amiodarone.    Statins: Increased risk of myopathy. Simvastatin- restrict dose to  daily. Other statins: counsel patients to report any muscle pain or weakness immediately.   Anticoagulants: Amiodarone can increase anticoagulant effect. Consider warfarin dose reduction. Patients should be monitored closely and the dose of anticoagulant altered accordingly, remembering that amiodarone levels take several weeks to stabilize.   Antiepileptics: Amiodarone can increase plasma concentration of phenytoin, the dose should be reduced. Note that small changes in phenytoin dose can result in large changes in levels. Monitor patient and counsel on signs of toxicity.   Beta blockers: increased risk of bradycardia, AV block and myocardial depression. Sotalol - avoid concomitant use.    Calcium channel blockers (diltiazem and verapamil): increased risk of bradycardia, AV block and myocardial depression.    Cyclosporine: Amiodarone increases levels of cyclosporine. Reduced dose of cyclosporine is recommended.   Digoxin dose should be halved when amiodarone is started.   Diuretics: increased risk of cardiotoxicity if hypokalemia occurs.   Oral hypoglycemic agents (glyburide, glipizide, glimepiride): increased risk of hypoglycemia. Patient's glucose levels should be monitored closely when initiating amiodarone therapy.    Drugs that prolong the QT interval:  Torsades de pointes risk may be increased with concurrent use - avoid if  possible.  Monitor QTc, also keep magnesium/potassium WNL if concurrent therapy can't be avoided. Marland Kitchen Antibiotics: e.g. fluoroquinolones, erythromycin. . Antiarrhythmics: e.g. quinidine, procainamide, disopyramide, sotalol. . Antipsychotics: e.g. phenothiazines, haloperidol.  . Lithium, tricyclic antidepressants, and methadone. Thank You,  Isaac Bliss, PharmD, BCPS, [facility] Clinical Pharmacist Pager [phone] 04/18/2015 4:14 PM

## 2015-04-18 NOTE — Anesthesia Postprocedure Evaluation (Signed)
Anesthesia Post Note  Patient: Jacquan Bohr  Procedure(s) Performed: Procedure(s) (LRB): CORONARY ARTERY BYPASS GRAFTING times four with LIMA  to LAD, LEFT RADIAL ARTERY to OM, Right saphenous vein harvesting andutilized to Interm and to PD ENDOSCOPIC GREATER SAPHENOUS VEIN HARVEST (EVH) RIGHT THIGH ([name]) TRANSESOPHAGEAL ECHOCARDIOGRAM (TEE) ([name])  LEFT Arm RADIAL ARTERY HARVEST (Left)  Patient location during evaluation: SICU Anesthesia Type: General Level of consciousness: sedated and patient remains intubated per anesthesia plan Pain management: pain level controlled Vital Signs Assessment: post-procedure vital signs reviewed and stable Respiratory status: patient remains intubated per anesthesia plan Cardiovascular status: stable Anesthetic complications: no    Last Vitals:  Filed Vitals:   04/18/15 0500 04/18/15 1545  BP: 143/99 107/68  Pulse: 63 90  Temp: 36.7 C   Resp: 18 18    Last Pain:  Filed Vitals:   04/18/15 1550  PainSc: 0-No pain                 Lewie Loron

## 2015-04-18 NOTE — Progress Notes (Signed)
Patient alert and able to follow commands appropriately.  VC: 897 ml NIF: -27    ABG WNL for protocol.

## 2015-04-18 NOTE — Procedures (Signed)
Extubation Procedure Note  Patient Details:   Name: [name] DOB: Sep 15, 1965 MRN: [id number]   Airway Documentation:  Airway 8.5 mm (Active)  Secured at (cm) 23 cm 04/18/2015  7:28 PM  Measured From Lips 04/18/2015  7:28 PM  Secured Location Right 04/18/2015  7:28 PM  Secured By Pink Tape 04/18/2015  7:28 PM  Site Condition Dry 04/18/2015  7:28 PM    Evaluation  O2 sats: stable throughout Complications: No apparent complications Patient did tolerate procedure well. Bilateral Breath Sounds: Rhonchi   Yes   Patient passed parameters for rapid wean protocol.  Cuff leak present on test.  Extubation to 5L [place] without complication.  Patient able to cough and speak.  IS administered by RN.  Ronda Fairly Mina Babula 04/18/2015, 8:26 PM

## 2015-04-18 NOTE — Progress Notes (Signed)
  Echocardiogram Echocardiogram Transesophageal has been performed.  Arvil Chaco 04/18/2015, 8:03 AM

## 2015-04-18 NOTE — Anesthesia Procedure Notes (Addendum)
Procedure Name: Intubation Date/Time: 04/18/2015 7:50 AM Performed by: Faustino Congress WEAVER Pre-anesthesia Checklist: Patient identified, Emergency Drugs available, Suction available and Patient being monitored Patient Re-evaluated:Patient Re-evaluated prior to inductionOxygen Delivery Method: Circle system utilized Preoxygenation: Pre-oxygenation with 100% oxygen Intubation Type: IV induction Ventilation: Mask ventilation without difficulty and Oral airway inserted - appropriate to patient size Laryngoscope Size: Glidescope and 4 Grade View: Grade I Tube type: Oral Tube size: 8.5 mm Number of attempts: 1 Airway Equipment and Method: Stylet Placement Confirmation: ETT inserted through vocal cords under direct vision,  positive ETCO2 and breath sounds checked- equal and bilateral Secured at: 23 cm Tube secured with: Tape Dental Injury: Teeth and Oropharynx as per pre-operative assessment     Central Venous Catheter Insertion Performed by: anesthesiologist Patient location: Pre-op. Preanesthetic checklist: patient identified, IV checked, site marked, risks and benefits discussed, surgical consent, monitors and equipment checked, pre-op evaluation, timeout performed and anesthesia consent Lidocaine 1% used for infiltration Landmarks identified Catheter size: 8.5 Fr Central line was placed.Sheath introducer Swan type and PA catheter depth:48PA Cath depth:48 Procedure performed using ultrasound guided technique. Attempts: 1 Following insertion, line sutured and dressing applied. Post procedure assessment: blood return through all ports, free fluid flow and no air. Patient tolerated the procedure well with no immediate complications.    Central Venous Catheter Insertion Performed by: anesthesiologist Patient location: Pre-op. Preanesthetic checklist: patient identified, IV checked, site marked, risks and benefits discussed, surgical consent, monitors and equipment checked,  pre-op evaluation, timeout performed and anesthesia consent Landmarks identified PA cath was placed.Swan type and PA catheter depth:thermodilation and 48PA Cath depth:48 Procedure performed using ultrasound guided technique. Attempts: 1 Patient tolerated the procedure well with no immediate complications.

## 2015-04-18 NOTE — Transfer of Care (Signed)
Immediate Anesthesia Transfer of Care Note  Patient: [name]  Procedure(s) Performed: Procedure(s): CORONARY ARTERY BYPASS GRAFTING times four with LIMA  to LAD, LEFT RADIAL ARTERY to OM, Right saphenous vein harvesting andutilized to Interm and to PD ENDOSCOPIC GREATER SAPHENOUS VEIN HARVEST (EVH) RIGHT THIGH ([name]) TRANSESOPHAGEAL ECHOCARDIOGRAM (TEE) ([name])  LEFT Arm RADIAL ARTERY HARVEST (Left)  Patient Location: SICU  Anesthesia Type:General  Level of Consciousness: Patient remains intubated per anesthesia plan  Airway & Oxygen Therapy: Patient remains intubated per anesthesia plan and Patient placed on Ventilator (see vital sign flow sheet for setting)  Post-op Assessment: Report given to RN and Post -op Vital signs reviewed and stable  Post vital signs: Reviewed and stable  Last Vitals:  Filed Vitals:   04/17/15 2049 04/18/15 0500  BP: 132/86 143/99  Pulse: 58 63  Temp: 36.9 C 36.7 C  Resp: 19 18    Complications: No apparent anesthesia complications

## 2015-04-18 NOTE — Progress Notes (Signed)
Patient ID: [name], male   DOB: 02-Jun-1965, 50 y.o.   MRN: [id number]  SICU Evening Rounds:   Hemodynamically stable, DDD paced at 80 on amio. CI = 2.4  Extubated, sleeping  Urine output good  CT output low  CBC    Component Value Date/Time   WBC 14.3* 04/18/2015 1552   RBC 3.84* 04/18/2015 1552   HGB 11.2* 04/18/2015 1554   HCT 33.0* 04/18/2015 1554   PLT 98* 04/18/2015 1552   MCV 86.5 04/18/2015 1552   MCH 30.5 04/18/2015 1552   MCHC 35.2 04/18/2015 1552   RDW 13.3 04/18/2015 1552     BMET    Component Value Date/Time   NA 139 04/18/2015 1554   K 3.6 04/18/2015 1554   CL 101 04/18/2015 1418   CO2 22 04/18/2015 0525   GLUCOSE 111* 04/18/2015 1554   BUN 16 04/18/2015 1418   CREATININE 0.70 04/18/2015 1418   CALCIUM 9.0 04/18/2015 0525   GFRNONAA >60 04/18/2015 0525   GFRAA >60 04/18/2015 0525     A/P:  Stable postop course. Continue current plans

## 2015-04-19 ENCOUNTER — Inpatient Hospital Stay (HOSPITAL_COMMUNITY): Payer: BLUE CROSS/BLUE SHIELD

## 2015-04-19 LAB — POCT I-STAT, CHEM 8
BUN: 26 mg/dL — ABNORMAL HIGH (ref 6–20)
Calcium, Ion: 1.23 mmol/L (ref 1.12–1.23)
Chloride: 99 mmol/L — ABNORMAL LOW (ref 101–111)
Creatinine, Ser: 1.3 mg/dL — ABNORMAL HIGH (ref 0.61–1.24)
Glucose, Bld: 133 mg/dL — ABNORMAL HIGH (ref 65–99)
HCT: 30 % — ABNORMAL LOW (ref 39.0–52.0)
Hemoglobin: 10.2 g/dL — ABNORMAL LOW (ref 13.0–17.0)
Potassium: 3.9 mmol/L (ref 3.5–5.1)
Sodium: 137 mmol/L (ref 135–145)
TCO2: 23 mmol/L (ref 0–100)

## 2015-04-19 LAB — GLUCOSE, CAPILLARY
Glucose-Capillary: 101 mg/dL — ABNORMAL HIGH (ref 65–99)
Glucose-Capillary: 101 mg/dL — ABNORMAL HIGH (ref 65–99)
Glucose-Capillary: 105 mg/dL — ABNORMAL HIGH (ref 65–99)
Glucose-Capillary: 111 mg/dL — ABNORMAL HIGH (ref 65–99)
Glucose-Capillary: 111 mg/dL — ABNORMAL HIGH (ref 65–99)
Glucose-Capillary: 114 mg/dL — ABNORMAL HIGH (ref 65–99)
Glucose-Capillary: 114 mg/dL — ABNORMAL HIGH (ref 65–99)
Glucose-Capillary: 116 mg/dL — ABNORMAL HIGH (ref 65–99)
Glucose-Capillary: 129 mg/dL — ABNORMAL HIGH (ref 65–99)
Glucose-Capillary: 130 mg/dL — ABNORMAL HIGH (ref 65–99)
Glucose-Capillary: 131 mg/dL — ABNORMAL HIGH (ref 65–99)
Glucose-Capillary: 131 mg/dL — ABNORMAL HIGH (ref 65–99)
Glucose-Capillary: 133 mg/dL — ABNORMAL HIGH (ref 65–99)
Glucose-Capillary: 134 mg/dL — ABNORMAL HIGH (ref 65–99)
Glucose-Capillary: 142 mg/dL — ABNORMAL HIGH (ref 65–99)
Glucose-Capillary: 144 mg/dL — ABNORMAL HIGH (ref 65–99)
Glucose-Capillary: 92 mg/dL (ref 65–99)

## 2015-04-19 LAB — CBC
HEMATOCRIT: 30.7 % — AB (ref 39.0–52.0)
HEMATOCRIT: 30.2 % — AB (ref 39.0–52.0)
HEMOGLOBIN: 10.4 g/dL — AB (ref 13.0–17.0)
Hemoglobin: 10.9 g/dL — ABNORMAL LOW (ref 13.0–17.0)
MCH: 30.6 pg (ref 26.0–34.0)
MCH: 30.2 pg (ref 26.0–34.0)
MCHC: 35.5 g/dL (ref 30.0–36.0)
MCHC: 34.4 g/dL (ref 30.0–36.0)
MCV: 86.2 fL (ref 78.0–100.0)
MCV: 87.8 fL (ref 78.0–100.0)
Platelets: 96 10*3/uL — ABNORMAL LOW (ref 150–400)
Platelets: 109 10*3/uL — ABNORMAL LOW (ref 150–400)
RBC: 3.56 MIL/uL — ABNORMAL LOW (ref 4.22–5.81)
RBC: 3.44 MIL/uL — AB (ref 4.22–5.81)
RDW: 13.3 % (ref 11.5–15.5)
RDW: 13.8 % (ref 11.5–15.5)
WBC: 10 10*3/uL (ref 4.0–10.5)
WBC: 12.3 10*3/uL — ABNORMAL HIGH (ref 4.0–10.5)

## 2015-04-19 LAB — BASIC METABOLIC PANEL
Anion gap: 8 (ref 5–15)
BUN: 16 mg/dL (ref 6–20)
CALCIUM: 8.4 mg/dL — AB (ref 8.9–10.3)
CHLORIDE: 108 mmol/L (ref 101–111)
CO2: 24 mmol/L (ref 22–32)
CREATININE: 0.87 mg/dL (ref 0.61–1.24)
GFR calc Af Amer: >60 mL/min (ref >60)
GFR calc non Af Amer: >60 mL/min (ref >60)
GLUCOSE: 114 mg/dL — AB (ref 65–99)
Potassium: 4.1 mmol/L (ref 3.5–5.1)
Sodium: 140 mmol/L (ref 135–145)

## 2015-04-19 LAB — POCT I-STAT 3, ART BLOOD GAS (G3+)
Acid-base deficit: 2 mmol/L (ref 0.0–2.0)
Bicarbonate: 23.3 mEq/L (ref 20.0–24.0)
O2 Saturation: 94 %
Patient temperature: 36.8
TCO2: 25 mmol/L (ref 0–100)
pCO2 arterial: 41.6 mmHg (ref 35.0–45.0)
pH, Arterial: 7.355 (ref 7.350–7.450)
pO2, Arterial: 73 mmHg — ABNORMAL LOW (ref 80.0–100.0)

## 2015-04-19 LAB — CREATININE, SERUM
Creatinine, Ser: 1.35 mg/dL — ABNORMAL HIGH (ref 0.61–1.24)
GFR calc Af Amer: >60 mL/min (ref >60)
GFR, EST NON AFRICAN AMERICAN: 59 mL/min — AB (ref >60)

## 2015-04-19 LAB — MAGNESIUM
MAGNESIUM: 2.4 mg/dL (ref 1.7–2.4)
Magnesium: 2.6 mg/dL — ABNORMAL HIGH (ref 1.7–2.4)

## 2015-04-19 LAB — TSH: TSH: 2.112 u[IU]/mL (ref 0.350–4.500)

## 2015-04-19 MED ORDER — ISOSORBIDE MONONITRATE ER 30 MG PO TB24
30.0000 mg | ORAL_TABLET | Freq: Every day | ORAL | Status: DC
  Administered 2015-04-19 – 2015-04-22 (×4): 30 mg via ORAL
  Filled 2015-04-19 (×4): qty 1

## 2015-04-19 MED ORDER — POTASSIUM CHLORIDE CRYS ER 20 MEQ PO TBCR
20.0000 meq | EXTENDED_RELEASE_TABLET | Freq: Two times a day (BID) | ORAL | Status: AC
  Administered 2015-04-19 (×2): 20 meq via ORAL
  Filled 2015-04-19 (×2): qty 1

## 2015-04-19 MED ORDER — ENOXAPARIN SODIUM 40 MG/0.4ML ~~LOC~~ SOLN
40.0000 mg | Freq: Every day | SUBCUTANEOUS | Status: DC
  Administered 2015-04-19 – 2015-04-21 (×3): 40 mg via SUBCUTANEOUS
  Filled 2015-04-19 (×3): qty 0.4

## 2015-04-19 MED ORDER — FUROSEMIDE 10 MG/ML IJ SOLN
40.0000 mg | Freq: Two times a day (BID) | INTRAMUSCULAR | Status: AC
  Administered 2015-04-19 (×2): 40 mg via INTRAVENOUS
  Filled 2015-04-19 (×2): qty 4

## 2015-04-19 MED ORDER — INSULIN ASPART 100 UNIT/ML ~~LOC~~ SOLN
0.0000 [IU] | SUBCUTANEOUS | Status: DC
  Administered 2015-04-19 – 2015-04-20 (×3): 2 [IU] via SUBCUTANEOUS

## 2015-04-19 NOTE — Progress Notes (Signed)
EKG CRITICAL VALUE     12 lead EKG performed.  Critical value noted.  Crystal, Adkin, RN notified.   Wandalee Ferdinand, CCT 04/19/2015 10:04 AM

## 2015-04-20 ENCOUNTER — Inpatient Hospital Stay (HOSPITAL_COMMUNITY): Payer: BLUE CROSS/BLUE SHIELD

## 2015-04-20 DIAGNOSIS — I2510 Atherosclerotic heart disease of native coronary artery without angina pectoris: Secondary | ICD-10-CM | POA: Insufficient documentation

## 2015-04-20 DIAGNOSIS — I25810 Atherosclerosis of coronary artery bypass graft(s) without angina pectoris: Secondary | ICD-10-CM

## 2015-04-20 LAB — GLUCOSE, CAPILLARY
Glucose-Capillary: 147 mg/dL — ABNORMAL HIGH (ref 65–99)
Glucose-Capillary: 116 mg/dL — ABNORMAL HIGH (ref 65–99)
Glucose-Capillary: 115 mg/dL — ABNORMAL HIGH (ref 65–99)

## 2015-04-20 LAB — BASIC METABOLIC PANEL
ANION GAP: 10 (ref 5–15)
BUN: 25 mg/dL — ABNORMAL HIGH (ref 6–20)
CHLORIDE: 99 mmol/L — AB (ref 101–111)
CO2: 26 mmol/L (ref 22–32)
Calcium: 8.5 mg/dL — ABNORMAL LOW (ref 8.9–10.3)
Creatinine, Ser: 1.06 mg/dL (ref 0.61–1.24)
Glucose, Bld: 138 mg/dL — ABNORMAL HIGH (ref 65–99)
POTASSIUM: 4 mmol/L (ref 3.5–5.1)
SODIUM: 135 mmol/L (ref 135–145)

## 2015-04-20 LAB — CBC
HEMATOCRIT: 28.9 % — AB (ref 39.0–52.0)
HEMOGLOBIN: 9.8 g/dL — AB (ref 13.0–17.0)
MCH: 29.9 pg (ref 26.0–34.0)
MCHC: 33.9 g/dL (ref 30.0–36.0)
MCV: 88.1 fL (ref 78.0–100.0)
PLATELETS: 102 10*3/uL — AB (ref 150–400)
RBC: 3.28 MIL/uL — AB (ref 4.22–5.81)
RDW: 13.7 % (ref 11.5–15.5)
WBC: 11.9 10*3/uL — AB (ref 4.0–10.5)

## 2015-04-20 MED ORDER — FUROSEMIDE 10 MG/ML IJ SOLN
40.0000 mg | Freq: Two times a day (BID) | INTRAMUSCULAR | Status: AC
  Administered 2015-04-20 (×2): 40 mg via INTRAVENOUS
  Filled 2015-04-20 (×2): qty 4

## 2015-04-20 MED ORDER — CETYLPYRIDINIUM CHLORIDE 0.05 % MT LIQD
7.0000 mL | Freq: Two times a day (BID) | OROMUCOSAL | Status: DC
  Administered 2015-04-20 – 2015-04-21 (×3): 7 mL via OROMUCOSAL

## 2015-04-20 MED ORDER — LEVALBUTEROL HCL 0.63 MG/3ML IN NEBU: 0.6300 mg | INHALATION_SOLUTION | Freq: Four times a day (QID) | RESPIRATORY_TRACT | Status: DC | PRN

## 2015-04-20 MED ORDER — POTASSIUM CHLORIDE CRYS ER 20 MEQ PO TBCR
40.0000 meq | EXTENDED_RELEASE_TABLET | Freq: Two times a day (BID) | ORAL | Status: AC
  Administered 2015-04-20 (×2): 40 meq via ORAL
  Filled 2015-04-20 (×2): qty 2

## 2015-04-20 NOTE — Progress Notes (Signed)
Patient ID: [name], male   DOB: 05-31-1965, 50 y.o.   MRN: [id number]  SICU Evening rounds:  Hemodynamically stable  Diuresing well  Ambulated 4 times  Had a good day.

## 2015-04-20 NOTE — Progress Notes (Signed)
2 Days Post-Op Procedure(s) (LRB): CORONARY ARTERY BYPASS GRAFTING times four with LIMA  to LAD, LEFT RADIAL ARTERY to OM, Right saphenous vein harvesting andutilized to Interm and to PD ENDOSCOPIC GREATER SAPHENOUS VEIN HARVEST (EVH) RIGHT THIGH ([name]) TRANSESOPHAGEAL ECHOCARDIOGRAM (TEE) ([name])  LEFT Arm RADIAL ARTERY HARVEST (Left) Subjective:  Winded after walking around ICU. Had to stop a couple times.  Objective: Vital signs in last 24 hours: Temp:  [97.8 F (36.6 C)-98.7 F (37.1 C)] 98.2 F (36.8 C) (01/29 0801) Pulse Rate:  [66-80] 76 (01/29 1005) Cardiac Rhythm:  [-] Normal sinus rhythm (01/29 0800) Resp:  [16-27] 20 (01/29 1005) BP: (92-123)/(52-80) 106/69 mmHg (01/29 1005) SpO2:  [87 %-96 %] 93 % (01/29 1005) Weight:  [114 kg (251 lb 5.2 oz)] 114 kg (251 lb 5.2 oz) (01/29 0500)  Hemodynamic parameters for last 24 hours:    Intake/Output from previous day: 01/28 0701 - 01/29 0700 In: 417 [I.V.:317; IV Piggyback:100] Out: 1575 [Urine:1525; Chest Tube:50] Intake/Output this shift: Total I/O In: 480 [P.O.:480] Out: 40 [Urine:40]  General appearance: alert and cooperative Heart: regular rate and rhythm, S1, S2 normal, no murmur, click, rub or gallop Lungs: clear to auscultation bilaterally Extremities: edema mild Wound: dressing dry  Lab Results:  Recent Labs  04/19/15 1700 04/19/15 1710 04/20/15 0315  WBC 12.3*  --  11.9*  HGB 10.4* 10.2* 9.8*  HCT 30.2* 30.0* 28.9*  PLT 109*  --  102*   BMET:  Recent Labs  04/19/15 0441  04/19/15 1710 04/20/15 0315  NA 140  --  137 135  K 4.1  --  3.9 4.0  CL 108  --  99* 99*  CO2 24  --   --  26  GLUCOSE 114*  --  133* 138*  BUN 16  --  26* 25*  CREATININE 0.87  < > 1.30* 1.06  CALCIUM 8.4*  --   --  8.5*  < > = values in this interval not displayed.  PT/INR:  Recent Labs  04/18/15 1552  LABPROT 17.6*  INR 1.44   ABG    Component Value Date/Time   PHART 7.355 04/19/2015 0419   HCO3 23.3 04/19/2015  0419   TCO2 23 04/19/2015 1710   ACIDBASEDEF 2.0 04/19/2015 0419   O2SAT 94.0 04/19/2015 0419   CBG (last 3)   Recent Labs  04/19/15 2328 04/20/15 0337 04/20/15 0759  GLUCAP 147* 116* 115*   CLINICAL DATA: Status post coronary bypass grafting  EXAM: PORTABLE CHEST 1 VIEW  COMPARISON: 04/19/2015  FINDINGS: Postsurgical changes are again seen. A right jugular sheath remains in place. The Swan-Ganz catheter, left thoracostomy catheter and mediastinal drain have been removed in the interval. No pneumothorax is seen. Mild left midlung atelectatic changes are seen. No other focal abnormality is noted.  IMPRESSION: Left midlung atelectasis. No pneumothorax following tube removal.   Electronically Signed  By: Alcide Clever M.D.  On: 04/20/2015 07:22  Assessment/Plan: S/P Procedure(s) (LRB): CORONARY ARTERY BYPASS GRAFTING times four with LIMA  to LAD, LEFT RADIAL ARTERY to OM, Right saphenous vein harvesting andutilized to Interm and to PD ENDOSCOPIC GREATER SAPHENOUS VEIN HARVEST (EVH) RIGHT THIGH ([name]) TRANSESOPHAGEAL ECHOCARDIOGRAM (TEE) ([name])  LEFT Arm RADIAL ARTERY HARVEST (Left)  He is hemodynamically stable in sinus rhythm on po amiodarone.  Heavy smoker with COPD: continue IS, ambulation,  Bronchodilator  Volume excess: weight is 16 lbs over preop if accurate. Continue diuresis.  Will keep in ICU today to continue mobilization and pulmonary toilet.  LOS: 5 days    Alleen Borne 04/20/2015

## 2015-04-20 NOTE — Plan of Care (Signed)
Problem: Respiratory: Goal: Levels of oxygenation will improve Outcome: Not Progressing Patient is on 5 liters of oxygen and RT tried to wean him to 4 liters but desats to 88-90%.   Problem: Pain Management: Goal: Pain level will decrease Outcome: Progressing Pain was under good control. IV morphine given three times and tramadol given once for pain.   Problem: Activity: Goal: Risk for activity intolerance will decrease Outcome: Progressing Ambulated the patient 600 ft and sat him up on a chair.

## 2015-04-21 ENCOUNTER — Encounter (HOSPITAL_COMMUNITY): Payer: Self-pay | Admitting: *Deleted

## 2015-04-21 LAB — BASIC METABOLIC PANEL
Anion gap: 6 (ref 5–15)
BUN: 24 mg/dL — AB (ref 6–20)
CHLORIDE: 103 mmol/L (ref 101–111)
CO2: 28 mmol/L (ref 22–32)
Calcium: 8.6 mg/dL — ABNORMAL LOW (ref 8.9–10.3)
Creatinine, Ser: 0.85 mg/dL (ref 0.61–1.24)
GFR calc Af Amer: >60 mL/min (ref >60)
GFR calc non Af Amer: >60 mL/min (ref >60)
GLUCOSE: 126 mg/dL — AB (ref 65–99)
POTASSIUM: 4.1 mmol/L (ref 3.5–5.1)
Sodium: 137 mmol/L (ref 135–145)

## 2015-04-21 LAB — POCT I-STAT 3, ART BLOOD GAS (G3+)
Acid-base deficit: 4 mmol/L — ABNORMAL HIGH (ref 0.0–2.0)
Bicarbonate: 23.4 mEq/L (ref 20.0–24.0)
O2 Saturation: 86 %
Patient temperature: 36.7
TCO2: 25 mmol/L (ref 0–100)
pCO2 arterial: 52.9 mmHg — ABNORMAL HIGH (ref 35.0–45.0)
pH, Arterial: 7.253 — ABNORMAL LOW (ref 7.350–7.450)
pO2, Arterial: 60 mmHg — ABNORMAL LOW (ref 80.0–100.0)

## 2015-04-21 LAB — GLUCOSE, CAPILLARY
Glucose-Capillary: 123 mg/dL — ABNORMAL HIGH (ref 65–99)
Glucose-Capillary: 116 mg/dL — ABNORMAL HIGH (ref 65–99)
Glucose-Capillary: 116 mg/dL — ABNORMAL HIGH (ref 65–99)

## 2015-04-21 MED ORDER — ONDANSETRON HCL 4 MG PO TABS: 4.0000 mg | ORAL_TABLET | Freq: Four times a day (QID) | ORAL | Status: DC | PRN

## 2015-04-21 MED ORDER — BISACODYL 10 MG RE SUPP: 10.0000 mg | Freq: Every day | RECTAL | Status: DC | PRN

## 2015-04-21 MED ORDER — TAMSULOSIN HCL 0.4 MG PO CAPS
0.4000 mg | ORAL_CAPSULE | Freq: Every day | ORAL | Status: DC
  Administered 2015-04-21 – 2015-04-22 (×2): 0.4 mg via ORAL
  Filled 2015-04-21 (×2): qty 1

## 2015-04-21 MED ORDER — POTASSIUM CHLORIDE CRYS ER 20 MEQ PO TBCR
20.0000 meq | EXTENDED_RELEASE_TABLET | Freq: Two times a day (BID) | ORAL | Status: AC
  Administered 2015-04-21 (×2): 20 meq via ORAL
  Filled 2015-04-21 (×2): qty 1

## 2015-04-21 MED ORDER — SODIUM CHLORIDE 0.9% FLUSH: 3.0000 mL | INTRAVENOUS | Status: DC | PRN

## 2015-04-21 MED ORDER — ACETAMINOPHEN 325 MG PO TABS: 650.0000 mg | ORAL_TABLET | Freq: Four times a day (QID) | ORAL | Status: DC | PRN

## 2015-04-21 MED ORDER — PANTOPRAZOLE SODIUM 40 MG PO TBEC
40.0000 mg | DELAYED_RELEASE_TABLET | Freq: Every day | ORAL | Status: DC
  Administered 2015-04-21 – 2015-04-22 (×2): 40 mg via ORAL
  Filled 2015-04-21 (×2): qty 1

## 2015-04-21 MED ORDER — FUROSEMIDE 40 MG PO TABS
40.0000 mg | ORAL_TABLET | Freq: Every day | ORAL | Status: AC
  Administered 2015-04-21 – 2015-04-22 (×2): 40 mg via ORAL
  Filled 2015-04-21 (×2): qty 1

## 2015-04-21 MED ORDER — GUAIFENESIN ER 600 MG PO TB12: 600.0000 mg | ORAL_TABLET | Freq: Two times a day (BID) | ORAL | Status: DC | PRN

## 2015-04-21 MED ORDER — SODIUM CHLORIDE 0.9% FLUSH
3.0000 mL | Freq: Two times a day (BID) | INTRAVENOUS | Status: DC
  Administered 2015-04-21 (×2): 3 mL via INTRAVENOUS

## 2015-04-21 MED ORDER — BISACODYL 5 MG PO TBEC: 10.0000 mg | DELAYED_RELEASE_TABLET | Freq: Every day | ORAL | Status: DC | PRN

## 2015-04-21 MED ORDER — NEBIVOLOL HCL 5 MG PO TABS
10.0000 mg | ORAL_TABLET | Freq: Every day | ORAL | Status: DC
  Administered 2015-04-21 – 2015-04-22 (×2): 10 mg via ORAL
  Filled 2015-04-21 (×2): qty 2
  Filled 2015-04-21: qty 1

## 2015-04-21 MED ORDER — TRAMADOL HCL 50 MG PO TABS
50.0000 mg | ORAL_TABLET | ORAL | Status: DC | PRN
  Administered 2015-04-21: 100 mg via ORAL
  Filled 2015-04-21: qty 2

## 2015-04-21 MED ORDER — DOCUSATE SODIUM 100 MG PO CAPS
200.0000 mg | ORAL_CAPSULE | Freq: Every day | ORAL | Status: DC
  Administered 2015-04-21 – 2015-04-22 (×2): 200 mg via ORAL
  Filled 2015-04-21 (×2): qty 2

## 2015-04-21 MED ORDER — MOVING RIGHT ALONG BOOK
Freq: Once | Status: AC
  Administered 2015-04-21: 09:00:00
  Filled 2015-04-21 (×2): qty 1

## 2015-04-21 MED ORDER — ONDANSETRON HCL 4 MG/2ML IJ SOLN: 4.0000 mg | Freq: Four times a day (QID) | INTRAMUSCULAR | Status: DC | PRN

## 2015-04-21 MED ORDER — SODIUM CHLORIDE 0.9 % IV SOLN: 250.0000 mL | INTRAVENOUS | Status: DC | PRN

## 2015-04-21 MED ORDER — ASPIRIN EC 325 MG PO TBEC
325.0000 mg | DELAYED_RELEASE_TABLET | Freq: Every day | ORAL | Status: DC
  Administered 2015-04-21 – 2015-04-22 (×2): 325 mg via ORAL
  Filled 2015-04-21 (×2): qty 1

## 2015-04-21 MED ORDER — FLUOXETINE HCL 20 MG PO CAPS
30.0000 mg | ORAL_CAPSULE | Freq: Every day | ORAL | Status: DC
  Administered 2015-04-21 – 2015-04-22 (×2): 30 mg via ORAL
  Filled 2015-04-21 (×3): qty 1

## 2015-04-21 MED ORDER — INSULIN ASPART 100 UNIT/ML ~~LOC~~ SOLN
0.0000 [IU] | Freq: Three times a day (TID) | SUBCUTANEOUS | Status: DC
  Administered 2015-04-21: 2 [IU] via SUBCUTANEOUS

## 2015-04-21 MED FILL — Heparin Sodium (Porcine) Inj 1000 Unit/ML: INTRAMUSCULAR | Qty: 30 | Status: AC

## 2015-04-21 MED FILL — Magnesium Sulfate Inj 50%: INTRAMUSCULAR | Qty: 10 | Status: AC

## 2015-04-21 MED FILL — Potassium Chloride Inj 2 mEq/ML: INTRAVENOUS | Qty: 40 | Status: AC

## 2015-04-21 NOTE — Plan of Care (Signed)
Problem: Bowel/Gastric: Goal: Gastrointestinal status for postoperative course will improve Outcome: Not Progressing Patient is not passing gas, has hypoactive bowel sounds and ambulated twice. No bowel movement since the surgery, will report to day nurse to get order for suppository.   Problem: Nutritional: Goal: Risk for body nutrition deficit will decrease Outcome: Not Progressing Patient reports not eating adequate during the day but drinking some fluids.   Problem: Respiratory: Goal: Levels of oxygenation will improve Patient is on 5 liters of oxygen and reports mild shortness of breadth when walking but doing better compared to yesterday. Pulmonary toilet in progress.   Problem: Pain Management: Goal: Pain level will decrease Outcome: Progressing Patient didn't need any pain medication except after his second walk this morning. Patient received Tramadol 100 mg for incisional pain.   Problem: Urinary Elimination: Goal: Ability to achieve and maintain adequate renal perfusion and functioning will improve Outcome: Progressing Foley was removed around 10 pm and the patient voided 300 cc urine after the foley removal.

## 2015-04-21 NOTE — Progress Notes (Signed)
     SUBJECTIVE: L arm sore. Chest sore. No SOB  BP 109/64 mmHg  Pulse 72  Temp(Src) 98.1 F (36.7 C) (Oral)  Resp 24  Ht 6' (1.829 m)  Wt 238 lb 1.6 oz (108 kg)  BMI 32.28 kg/m2  SpO2 95%  Intake/Output Summary (Last 24 hours) at 04/21/15 1027 Last data filed at 04/21/15 1000  Gross per 24 hour  Intake   1380 ml  Output   2525 ml  Net  -1145 ml    PHYSICAL EXAM General: Well developed, well nourished, in no acute distress. Alert and oriented x 3.  Psych:  Good affect, responds appropriately Neck: No JVD. No masses noted.  Lungs: Clear bilaterally with no wheezes or rhonci noted.  Heart: RRR with no murmurs noted. Abdomen: Bowel sounds are present. Soft, non-tender.  Extremities: No lower extremity edema.   LABS: Basic Metabolic Panel:  Recent Labs  03/23/70 0441 04/19/15 1700  04/20/15 0315 04/21/15 0345  NA 140  --   < > 135 137  K 4.1  --   < > 4.0 4.1  CL 108  --   < > 99* 103  CO2 24  --   --  26 28  GLUCOSE 114*  --   < > 138* 126*  BUN 16  --   < > 25* 24*  CREATININE 0.87 1.35*  < > 1.06 0.85  CALCIUM 8.4*  --   --  8.5* 8.6*  MG 2.6* 2.4  --   --   --   < > = values in this interval not displayed. CBC:  Recent Labs  04/19/15 1700 04/19/15 1710 04/20/15 0315  WBC 12.3*  --  11.9*  HGB 10.4* 10.2* 9.8*  HCT 30.2* 30.0* 28.9*  MCV 87.8  --  88.1  PLT 109*  --  102*   Current Meds: . amiodarone  200 mg Oral Q12H   Followed by  . [START ON 04/26/2015] amiodarone  200 mg Oral Daily  . antiseptic oral rinse  7 mL Mouth Rinse BID  . aspirin EC  325 mg Oral Daily  . atorvastatin  80 mg Oral q1800  . docusate sodium  200 mg Oral Daily  . enoxaparin (LOVENOX) injection  40 mg Subcutaneous QHS  . FLUoxetine  30 mg Oral Daily  . furosemide  40 mg Oral Daily  . insulin aspart  0-24 Units Subcutaneous TID AC & HS  . isosorbide mononitrate  30 mg Oral Daily  . moving right along book   Does not apply Once  . nebivolol  10 mg Oral Daily  .  pantoprazole  40 mg Oral QAC breakfast  . potassium chloride  20 mEq Oral BID  . [START ON 04/22/2015] predniSONE  20 mg Oral Q breakfast   Followed by  . [START ON 04/24/2015] predniSONE  10 mg Oral Q breakfast  . sodium chloride flush  3 mL Intravenous Q12H  . tamsulosin  0.4 mg Oral Daily     ASSESSMENT AND PLAN:  1. CAD with unstable angina: He is now s/p CABG. He is doing well on ASA, statin and beta blocker. Mild post-op volume overload. Now on Lasix. He is in sinus. Out to 2W today.   MCALHANY,[name]  1/30/201710:27 AM  \

## 2015-04-21 NOTE — Op Note (Signed)
[name], [name] NO.:  [id number]  MEDICAL RECORD NO.:  [id number]  LOCATION:  [facility]                       FACILITY:  [facility]  PHYSICIAN:  Sheliah Plane, MD    DATE OF BIRTH:  03-08-1966  DATE OF PROCEDURE:  04/18/2015 DATE OF DISCHARGE:  04/18/2015                              OPERATIVE REPORT   PREOPERATIVE DIAGNOSIS:  Coronary occlusive disease with recent non-ST- elevation myocardial infarction.  POSTOPERATIVE DIAGNOSIS:  Coronary occlusive disease with recent non-ST- elevation myocardial infarction.  SURGICAL PROCEDURE:  Coronary artery bypass grafting x4 with left internal mammary to the left anterior descending coronary artery.  Left radial coronary artery grafted to the circumflex coronary artery. Reverse saphenous vein graft to the intermediate coronary artery. Reverse saphenous vein graft to the posterior descending coronary artery with right greater saphenous vein harvesting endoscopically from the right thigh and open harvesting of the left radial artery.  SURGEON:  Sheliah Plane, MD.  FIRST ASSISTANTS:  Rowe Clack, PA-C and [place], [place].  BRIEF HISTORY:  The patient is a 50 year old male with a strong family history of coronary occlusive disease and long history of smoking who presents with episode of prolonged chest pain.  He was stabilized medically, underwent cardiac catheterization by Dr. Allyson Sabal which demonstrated severe three-vessel coronary artery disease with greater than 80% stenosis of the right coronary artery, greater than 80% stenosis of the circumflex coronary artery, ostial 80% stenosis of the intermediate coronary artery.  The left anterior descending coronary artery at the takeoff of the septal branch had 60%-70% stenosis. Because of the patient's significant 3-vessel coronary artery disease and symptoms, coronary artery bypass grafting was recommended to the patient.  Overall, ventricular function was  preserved.  The patient agreed and signed informed consent.  The patient was right-handed and had negative Allen test both on exam and by the vascular lab.  Specific risks and options of use of the radial artery and potential neurovascular injury to the hand was discussed with the patient.  DESCRIPTION OF PROCEDURE:  With Swan-Ganz and arterial line monitors in place and with radial artery in the right arm, the patient underwent general endotracheal anesthesia without incidence.  Skin of the chest and legs and left arm was prepped with Betadine and draped in sterile manner.  Appropriate time-out was performed.  We first proceeded with open harvesting of the left radial artery.  A curvilinear incision was made along the forearm and then dissection carried down identifying the radial artery, and using the Harmonic scalpel, the small branches of the radial artery were carefully divided.  With the bulldog on the radial artery, there was intact Doppler signal at the palmar arch.  The patient was heparinized with 3000 units of heparin.  The radial artery was then divided.  The proximal and distal ends were oversewn.  Cannula was placed on the radial artery and the quality of the graft was excellent and a very large radial artery.  The artery was then stored in heparinized saline with blood.  With the operative field hemostatic, the subcutaneous tissue was closed with a running 2-0 Vicryl and 3-0 subcuticular stitch in skin edges.  Dry dressings were applied to the arm, and the  arm was tucked.  We then proceeded with endo vein harvesting of the greater saphenous vein from the right thigh which was of good quality and caliber.  Median sternotomy was performed.  Left internal mammary artery was dissected down as a pedicle graft.  Distal artery was divided and had good free flow.  Pericardium was opened, and overall ventricular function appeared preserved.  The patient was systemically  heparinized.  Ascending aorta was cannulated.  The right atrium was cannulated.  An aortic root vent cardioplegia needle was introduced into the ascending aorta.  The patient was placed on cardiopulmonary bypass 2.4 L/min/m2.  Sites of anastomosis were inspected and dissected out of the epicardium.  The patient's left anterior descending coronary artery was intramyocardial.  The aortic cross-clamp was applied and 600 mL of cold blood potassium cardioplegia was administered with diastolic arrest of the heart.  Myocardial septal temperature was monitored throughout the crossclamp.  We turned our attention first to the posterior descending coronary artery, which was opened.  The distal right coronary artery was significantly calcified. The midportion of the posterior descending could be opened and admitted a 1.5-mm probe distally.  Using a running 7-0 Prolene, distal anastomosis was performed.  We then turned our attention to the circumflex coronary artery, which was opened and was a good quality vessel.  Using a running 8-0 Prolene, the radial artery was anastomosed to the circumflex coronary artery.  The intermediate coronary artery was partially intramyocardial.  This vessel was opened, admitted a 1.5 mm probe.  Using a segment of reverse saphenous vein graft, distal anastomosis was performed.  We then turned our attention to the left anterior descending coronary artery and between the mid and distal third of the vessel, dissection was carried down to the LAD which was deeply intramyocardial.  In doing this dissection, the right ventricle was entered.  After identifying the LAD, pledgeted suture was placed deep on each side of the LAD underneath to close the right ventricle in such a fashion as not to interfere with the LAD.  The left anterior descending coronary artery was then anastomosed to left anterior descending coronary artery with a running 8-0 Prolene.  With the removal of  the bulldog on the mammary artery, there was rise in myocardial septal temperature.  The bulldog was placed back on the mammary artery.  With the crossclamp still in place, 2 punch aortotomies were performed in each of the two vein grafts were anastomosed to the ascending aorta. Off the hood of the vein graft to the circumflex, the vein was opened and the radial artery was anastomosed with a running 7-0 Prolene to the hood of the vein graft to the intermediate.  The heart was allowed to passively fill and de-air.  The aortic crossclamp was then removed with total crossclamp time of 122 minutes.  Sites of the bulldog on the mammary artery were removed with prompt rise in myocardial septal temperature.  The patient required electric defibrillation one time to return to a sinus rhythm.  Sites of anastomosis were inspected and were free of bleeding.  He was then ventilated and weaned from cardiopulmonary bypass without difficulty.  He remained hemodynamically stable, and was decannulated in usual fashion.  Protamine sulfate was administered with the operative field hemostatic.  Atrial and ventricular pacing wires were applied.  Graft marker was applied.  Left pleural tube and a Blake mediastinal drain were left in place.  The sternum was closed with #6 stainless steel wire.  Fascia was  closed with interrupted 0 Vicryl and 3-0 Vicryl in subcutaneous tissue, and 3-0 subcuticular stitch in skin edges.  Dry dressings were applied.  Sponge and needle count was reported as correct at the completion of the procedure.  The patient tolerated the procedure without obvious complication and was transferred to the Surgical Intensive Care Unit. He had been started on amiodarone drip during the procedure because of atrial fibrillation during cannulation.  Total pump time 162 minutes. The patient did not require any blood bank blood products during the operative procedure.     Sheliah Plane,  MD     EG/MEDQ  D:  04/21/2015  T:  04/21/2015  Job:  [id number]

## 2015-04-21 NOTE — Progress Notes (Signed)
CARDIAC REHAB PHASE I   Second attempt to ambulate with pt today, pt states he just ambulated independently in hallway, no complaints. Will follow up tomorrow.    Joylene Grapes, RN, BSN 04/21/2015 2:28 PM

## 2015-04-21 NOTE — Op Note (Deleted)
NAMEROSWELL, [name] NO.:  [id number]  MEDICAL RECORD NO.:  [id number]  LOCATION:  [facility]                       FACILITY:  [facility]  PHYSICIAN:  Sheliah Plane, MD    DATE OF BIRTH:  03-08-1966  DATE OF PROCEDURE:  04/18/2015 DATE OF DISCHARGE:  04/18/2015                              OPERATIVE REPORT   PREOPERATIVE DIAGNOSIS:  Coronary occlusive disease with recent non-ST- elevation myocardial infarction.  POSTOPERATIVE DIAGNOSIS:  Coronary occlusive disease with recent non-ST- elevation myocardial infarction.  SURGICAL PROCEDURE:  Coronary artery bypass grafting x4 with left internal mammary to the left anterior descending coronary artery.  Left radial coronary artery grafted to the circumflex coronary artery. Reverse saphenous vein graft to the intermediate coronary artery. Reverse saphenous vein graft to the posterior descending coronary artery with right greater saphenous vein harvesting endoscopically from the right thigh and open harvesting of the left radial artery.  SURGEON:  Sheliah Plane, MD.  FIRST ASSISTANTS:  Rowe Clack, PA-C and [place], [place].  BRIEF HISTORY:  The patient is a 50 year old male with a strong family history of coronary occlusive disease and long history of smoking who presents with episode of prolonged chest pain.  He was stabilized medically, underwent cardiac catheterization by Dr. Allyson Sabal which demonstrated severe three-vessel coronary artery disease with greater than 80% stenosis of the right coronary artery, greater than 80% stenosis of the circumflex coronary artery, ostial 80% stenosis of the intermediate coronary artery.  The left anterior descending coronary artery at the takeoff of the septal branch had 60%-70% stenosis. Because of the patient's significant 3-vessel coronary artery disease and symptoms, coronary artery bypass grafting was recommended to the patient.  Overall, ventricular function was  preserved.  The patient agreed and signed informed consent.  The patient was right-handed and had negative Allen test both on exam and by the vascular lab.  Specific risks and options of use of the radial artery and potential neurovascular injury to the hand was discussed with the patient.  DESCRIPTION OF PROCEDURE:  With Swan-Ganz and arterial line monitors in place and with radial artery in the right arm, the patient underwent general endotracheal anesthesia without incidence.  Skin of the chest and legs and left arm was prepped with Betadine and draped in sterile manner.  Appropriate time-out was performed.  We first proceeded with open harvesting of the left radial artery.  A curvilinear incision was made along the forearm and then dissection carried down identifying the radial artery, and using the Harmonic scalpel, the small branches of the radial artery were carefully divided.  With the bulldog on the radial artery, there was intact Doppler signal at the palmar arch.  The patient was heparinized with 3000 units of heparin.  The radial artery was then divided.  The proximal and distal ends were oversewn.  Cannula was placed on the radial artery and the quality of the graft was excellent and a very large radial artery.  The artery was then stored in heparinized saline with blood.  With the operative field hemostatic, the subcutaneous tissue was closed with a running 2-0 Vicryl and 3-0 subcuticular stitch in skin edges.  Dry dressings were applied to the arm, and the  arm was tucked.  We then proceeded with endo vein harvesting of the greater saphenous vein from the right thigh which was of good quality and caliber.  Median sternotomy was performed.  Left internal mammary artery was dissected down as a pedicle graft.  Distal artery was divided and had good free flow.  Pericardium was opened, and overall ventricular function appeared preserved.  The patient was systemically  heparinized.  Ascending aorta was cannulated.  The right atrium was cannulated.  An aortic root vent cardioplegia needle was introduced into the ascending aorta.  The patient was placed on cardiopulmonary bypass 2.4 L/min/m2.  Sites of anastomosis were inspected and dissected out of the epicardium.  The patient's left anterior descending coronary artery was intramyocardial.  The aortic cross-clamp was applied and 600 mL of cold blood potassium cardioplegia was administered with diastolic arrest of the heart.  Myocardial septal temperature was monitored throughout the crossclamp.  We turned our attention first to the posterior descending coronary artery, which was opened.  The distal right coronary artery was significantly calcified. The midportion of the posterior descending could be opened and admitted a 1.5-mm probe distally.  Using a running 7-0 Prolene, distal anastomosis was performed.  We then turned our attention to the circumflex coronary artery, which was opened and was a good quality vessel.  Using a running 8-0 Prolene, the radial artery was anastomosed to the circumflex coronary artery.  The intermediate coronary artery was partially intramyocardial.  This vessel was opened, admitted a 1.5 mm probe.  Using a segment of reverse saphenous vein graft, distal anastomosis was performed.  We then turned our attention to the left anterior descending coronary artery and between the mid and distal third of the vessel, dissection was carried down to the LAD which was deeply intramyocardial.  In doing this dissection, the right ventricle was entered.  After identifying the LAD, pledgeted suture was placed deep on each side of the LAD underneath to close the right ventricle in such a fashion as not to interfere with the LAD.  The left anterior descending coronary artery was then anastomosed to left anterior descending coronary artery with a running 8-0 Prolene.  With the removal of  the bulldog on the mammary artery, there was rise in myocardial septal temperature.  The bulldog was placed back on the mammary artery.  With the crossclamp still in place, 2 punch aortotomies were performed in each of the two vein grafts were anastomosed to the ascending aorta. Off the hood of the vein graft to the circumflex, the vein was opened and the radial artery was anastomosed with a running 7-0 Prolene to the hood of the vein graft to the intermediate.  The heart was allowed to passively fill and de-air.  The aortic crossclamp was then removed with total crossclamp time of 122 minutes.  Sites of the bulldog on the mammary artery were removed with prompt rise in myocardial septal temperature.  The patient required electric defibrillation one time to return to a sinus rhythm.  Sites of anastomosis were inspected and were free of bleeding.  He was then ventilated and weaned from cardiopulmonary bypass without difficulty.  He remained hemodynamically stable, and was decannulated in usual fashion.  Protamine sulfate was administered with the operative field hemostatic.  Atrial and ventricular pacing wires were applied.  Graft marker was applied.  Left pleural tube and a Blake mediastinal drain were left in place.  The sternum was closed with #6 stainless steel wire.  Fascia was  closed with interrupted 0 Vicryl and 3-0 Vicryl in subcutaneous tissue, and 3-0 subcuticular stitch in skin edges.  Dry dressings were applied.  Sponge and needle count was reported as correct at the completion of the procedure.  The patient tolerated the procedure without obvious complication and was transferred to the Surgical Intensive Care Unit. He had been started on amiodarone drip during the procedure because of atrial fibrillation during cannulation.  Total pump time 162 minutes. The patient did not require any blood bank blood products during the operative procedure.     Sheliah Plane,  MD     EG/MEDQ  D:  04/21/2015  T:  04/21/2015  Job:  [id number]

## 2015-04-21 NOTE — Op Note (Deleted)
[name], [name] NO.:  [id number]  MEDICAL RECORD NO.:  [id number]  LOCATION:  [facility]                       FACILITY:  [facility]  PHYSICIAN:  Sheliah Plane, MD    DATE OF BIRTH:  [birth year]  DATE OF PROCEDURE:  04/18/2015 DATE OF DISCHARGE:  04/18/2015                              OPERATIVE REPORT   PREOPERATIVE DIAGNOSIS:  Coronary occlusive disease with recent non-ST- elevation myocardial infarction.  POSTOPERATIVE DIAGNOSIS:  Coronary occlusive disease with recent non-ST- elevation myocardial infarction.  SURGICAL PROCEDURE:  Coronary artery bypass grafting x4 with left internal mammary to the left anterior descending coronary artery.  Left radial coronary artery grafted to the circumflex coronary artery. Reverse saphenous vein graft to the intermediate coronary artery. Reverse saphenous vein graft to the posterior descending coronary artery with right greater saphenous vein harvesting endoscopically from the right thigh and open harvesting of the left radial artery.  SURGEON:  Sheliah Plane, MD.  FIRST ASSISTANTS:  Rowe Clack, PA-C and [place], [place].  BRIEF HISTORY:  The patient is a 50 year old male with a strong family history of coronary occlusive disease and long history of smoking who presents with episode of prolonged chest pain.  He was stabilized medically, underwent cardiac catheterization by Dr. Allyson Sabal which demonstrated severe three-vessel coronary artery disease with greater than 80% stenosis of the right coronary artery, greater than 80% stenosis of the circumflex coronary artery, ostial 80% stenosis of the intermediate coronary artery.  The left anterior descending coronary artery at the takeoff of the septal branch had 60%-70% stenosis. Because of the patient's significant 3-vessel coronary artery disease and symptoms, coronary artery bypass grafting was recommended to the patient.  Overall, ventricular function was  preserved.  The patient agreed and signed informed consent.  The patient was right-handed and had negative Allen test both on exam and by the vascular lab.  Specific risks and options of use of the radial artery and potential neurovascular injury to the hand was discussed with the patient.  DESCRIPTION OF PROCEDURE:  With Swan-Ganz and arterial line monitors in place and with radial artery in the right arm, the patient underwent general endotracheal anesthesia without incidence.  Skin of the chest and legs and left arm was prepped with Betadine and draped in sterile manner.  Appropriate time-out was performed.  We first proceeded with open harvesting of the left radial artery.  A curvilinear incision was made along the forearm and then dissection carried down identifying the radial artery, and using the Harmonic scalpel, the small branches of the radial artery were carefully divided.  With the bulldog on the radial artery, there was intact Doppler signal at the palmar arch.  The patient was heparinized with 3000 units of heparin.  The radial artery was then divided.  The proximal and distal ends were oversewn.  Cannula was placed on the radial artery and the quality of the graft was excellent and a very large radial artery.  The artery was then stored in heparinized saline with blood.  With the operative field hemostatic, the subcutaneous tissue was closed with a running 2-0 Vicryl and 3-0 subcuticular stitch in skin edges.  Dry dressings were applied to the arm, and the  arm was tucked.  We then proceeded with endo vein harvesting of the greater saphenous vein from the right thigh which was of good quality and caliber.  Median sternotomy was performed.  Left internal mammary artery was dissected down as a pedicle graft.  Distal artery was divided and had good free flow.  Pericardium was opened, and overall ventricular function appeared preserved.  The patient was systemically  heparinized.  Ascending aorta was cannulated.  The right atrium was cannulated.  An aortic root vent cardioplegia needle was introduced into the ascending aorta.  The patient was placed on cardiopulmonary bypass 2.4 L/min/m2.  Sites of anastomosis were inspected and dissected out of the epicardium.  The patient's left anterior descending coronary artery was intramyocardial.  The aortic cross-clamp was applied and 600 mL of cold blood potassium cardioplegia was administered with diastolic arrest of the heart.  Myocardial septal temperature was monitored throughout the crossclamp.  We turned our attention first to the posterior descending coronary artery, which was opened.  The distal right coronary artery was significantly calcified. The midportion of the posterior descending could be opened and admitted a 1.5-mm probe distally.  Using a running 7-0 Prolene, distal anastomosis was performed.  We then turned our attention to the circumflex coronary artery, which was opened and was a good quality vessel.  Using a running 8-0 Prolene, the radial artery was anastomosed to the circumflex coronary artery.  The intermediate coronary artery was partially intramyocardial.  This vessel was opened, admitted a 1.5 mm probe.  Using a segment of reverse saphenous vein graft, distal anastomosis was performed.  We then turned our attention to the left anterior descending coronary artery and between the mid and distal third of the vessel, dissection was carried down to the LAD which was deeply intramyocardial.  In doing this dissection, the right ventricle was entered.  After identifying the LAD, pledgeted suture was placed deep on each side of the LAD underneath to close the right ventricle in such a fashion as not to interfere with the LAD.  The left anterior descending coronary artery was then anastomosed to left anterior descending coronary artery with a running 8-0 Prolene.  With the removal of  the bulldog on the mammary artery, there was rise in myocardial septal temperature.  The bulldog was placed back on the mammary artery.  With the crossclamp still in place, 2 punch aortotomies were performed in each of the two vein grafts were anastomosed to the ascending aorta. Off the hood of the vein graft to the circumflex, the vein was opened and the radial artery was anastomosed with a running 7-0 Prolene to the hood of the vein graft to the intermediate.  The heart was allowed to passively fill and de-air.  The aortic crossclamp was then removed with total crossclamp time of 122 minutes.  Sites of the bulldog on the mammary artery were removed with prompt rise in myocardial septal temperature.  The patient required electric defibrillation one time to return to a sinus rhythm.  Sites of anastomosis were inspected and were free of bleeding.  He was then ventilated and weaned from cardiopulmonary bypass without difficulty.  He remained hemodynamically stable, and was decannulated in usual fashion.  Protamine sulfate was administered with the operative field hemostatic.  Atrial and ventricular pacing wires were applied.  Graft marker was applied.  Left pleural tube and a Blake mediastinal drain were left in place.  The sternum was closed with #6 stainless steel wire.  Fascia was  closed with interrupted 0 Vicryl and 3-0 Vicryl in subcutaneous tissue, and 3-0 subcuticular stitch in skin edges.  Dry dressings were applied.  Sponge and needle count was reported as correct at the completion of the procedure.  The patient tolerated the procedure without obvious complication and was transferred to the Surgical Intensive Care Unit. He had been started on amiodarone drip during the procedure because of atrial fibrillation during cannulation.  Total pump time 162 minutes. The patient did not require any blood bank blood products during the operative procedure.     Sheliah Plane,  MD     EG/MEDQ  D:  04/21/2015  T:  04/21/2015  Job:  [id number]

## 2015-04-21 NOTE — Progress Notes (Signed)
Patient ID: [name], male   DOB: [birth year], 50 y.o.   MRN: [id number] TCTS DAILY ICU PROGRESS NOTE                   [address]            [place] [address]          [phone]   3 Days Post-Op Procedure(s) (LRB): CORONARY ARTERY BYPASS GRAFTING times four with LIMA  to LAD, LEFT RADIAL ARTERY to OM, Right saphenous vein harvesting andutilized to Interm and to PD ENDOSCOPIC GREATER SAPHENOUS VEIN HARVEST (EVH) RIGHT THIGH ([name]) TRANSESOPHAGEAL ECHOCARDIOGRAM (TEE) ([name])  LEFT Arm RADIAL ARTERY HARVEST (Left)  Total Length of Stay:  LOS: 6 days   Subjective: Feels better, much better respiratory reserve today  Objective: Vital signs in last 24 hours: Temp:  [98.1 F (36.7 C)-98.6 F (37 C)] 98.3 F (36.8 C) (01/30 0355) Pulse Rate:  [71-88] 71 (01/30 0700) Cardiac Rhythm:  [-] Normal sinus rhythm (01/30 0400) Resp:  [16-26] 18 (01/30 0700) BP: (92-132)/(54-80) 125/78 mmHg (01/30 0700) SpO2:  [90 %-96 %] 96 % (01/30 0700) Weight:  [238 lb 1.6 oz (108 kg)] 238 lb 1.6 oz (108 kg) (01/30 0500)  Filed Weights   04/19/15 0500 04/20/15 0500 04/21/15 0500  Weight: 249 lb 9 oz (113.2 kg) 251 lb 5.2 oz (114 kg) 238 lb 1.6 oz (108 kg)    Weight change: -13 lb 3.6 oz (-6 kg)   Hemodynamic parameters for last 24 hours:    Intake/Output from previous day: 01/29 0701 - 01/30 0700 In: 1620 [P.O.:1610; I.V.:10] Out: 2565 [Urine:2565]  Intake/Output this shift:    Current Meds: Scheduled Meds: . acetaminophen  1,000 mg Oral 4 times per day   Or  . acetaminophen (TYLENOL) oral liquid 160 mg/5 mL  1,000 mg Per Tube 4 times per day  . amiodarone  200 mg Oral Q12H   Followed by  . [START ON 04/26/2015] amiodarone  200 mg Oral Daily  . antiseptic oral rinse  7 mL Mouth Rinse BID  . aspirin EC  325 mg Oral Daily   Or  . aspirin  324 mg Per Tube Daily  . atorvastatin  80 mg Oral q1800  . bisacodyl  10 mg Oral Daily   Or  . bisacodyl  10 mg Rectal  Daily  . docusate sodium  200 mg Oral Daily  . enoxaparin (LOVENOX) injection  40 mg Subcutaneous QHS  . isosorbide mononitrate  30 mg Oral Daily  . metoprolol tartrate  12.5 mg Oral BID  . pantoprazole  40 mg Oral Daily  . predniSONE  30 mg Oral Q breakfast   Followed by  . [START ON 04/22/2015] predniSONE  20 mg Oral Q breakfast   Followed by  . [START ON 04/24/2015] predniSONE  10 mg Oral Q breakfast  . sodium chloride flush  3 mL Intravenous Q12H   Continuous Infusions: . sodium chloride    . lactated ringers     PRN Meds:.lactated ringers, levalbuterol, morphine injection, ondansetron (ZOFRAN) IV, oxyCODONE, sodium chloride flush, traMADol  General appearance: alert and cooperative Neurologic: intact Heart: regular rate and rhythm, S1, S2 normal, no murmur, click, rub or gallop Lungs: diminished breath sounds bibasilar Abdomen: soft, non-tender; bowel sounds normal; no masses,  no organomegaly Extremities: extremities normal, atraumatic, no cyanosis or edema and Homans sign is negative, no sign of DVT Wound: sternum stable, left hand neurovascular intact  Lab Results:  CBC: Recent Labs  04/19/15 1700 04/19/15 1710 04/20/15 0315  WBC 12.3*  --  11.9*  HGB 10.4* 10.2* 9.8*  HCT 30.2* 30.0* 28.9*  PLT 109*  --  102*   BMET:  Recent Labs  04/20/15 0315 04/21/15 0345  NA 135 137  K 4.0 4.1  CL 99* 103  CO2 26 28  GLUCOSE 138* 126*  BUN 25* 24*  CREATININE 1.06 0.85  CALCIUM 8.5* 8.6*    PT/INR:  Recent Labs  04/18/15 1552  LABPROT 17.6*  INR 1.44   Radiology: No results found.   Assessment/Plan: S/P Procedure(s) (LRB): CORONARY ARTERY BYPASS GRAFTING times four with LIMA  to LAD, LEFT RADIAL ARTERY to OM, Right saphenous vein harvesting andutilized to Interm and to PD ENDOSCOPIC GREATER SAPHENOUS VEIN HARVEST (EVH) RIGHT THIGH ([name]) TRANSESOPHAGEAL ECHOCARDIOGRAM (TEE) ([name])  LEFT Arm RADIAL ARTERY HARVEST (Left) Mobilize Diuresis Plan for transfer  to step-down: see transfer orders     Delight Ovens 04/21/2015 7:54 AM

## 2015-04-21 NOTE — Care Management Note (Signed)
Case Management Note  Patient Details  Name: [name] MRN: [id number] Date of Birth: [birth year]  Subjective/Objective:     Pt is a very pleasant 50 year old gentleman who presented with chest pain. History of hyperlipidemia, hypertension and exertional chest pain in the last few weeks. His left cardiac cath yesterday showed diffuse 3 vessel disease, he was evaluated by CT surgery had CABG on Friday 04/18/15. Pt is from home with family support of wife, wife will provide 24 hour supervision post discharge.               Updated Action Plan:  CM assessed pt on 2W, pt no longer feels HHPT will be needed.  CM will continue to monitor for disposition needs  Action/Plan: Per speaking with pt- he has a Herniated Disc and pt may benefit from HHPT services once stable for d/c. CM will continue to monitor for additional needs.    Expected Discharge Date:                  Expected Discharge Plan:  Home w Home Health Services  In-House Referral:     Discharge planning Services  CM Consult  Post Acute Care Choice:  Home Health Choice offered to:     DME Arranged:    DME Agency:     HH Arranged:  PT HH Agency:     Status of Service:  In process, will continue to follow  Medicare Important Message Given:    Date Medicare IM Given:    Medicare IM give by:    Date Additional Medicare IM Given:    Additional Medicare Important Message give by:     If discussed at Long Length of Stay Meetings, dates discussed:    Additional Comments:  Cherylann Parr, RN 04/21/2015, 1:57 PM

## 2015-04-22 ENCOUNTER — Inpatient Hospital Stay (HOSPITAL_COMMUNITY): Payer: BLUE CROSS/BLUE SHIELD

## 2015-04-22 LAB — BASIC METABOLIC PANEL
Anion gap: 10 (ref 5–15)
BUN: 28 mg/dL — ABNORMAL HIGH (ref 6–20)
CO2: 25 mmol/L (ref 22–32)
Calcium: 8.7 mg/dL — ABNORMAL LOW (ref 8.9–10.3)
Chloride: 102 mmol/L (ref 101–111)
Creatinine, Ser: 0.94 mg/dL (ref 0.61–1.24)
GFR calc Af Amer: >60 mL/min (ref >60)
GFR calc non Af Amer: >60 mL/min (ref >60)
Glucose, Bld: 114 mg/dL — ABNORMAL HIGH (ref 65–99)
Potassium: 4.1 mmol/L (ref 3.5–5.1)
Sodium: 137 mmol/L (ref 135–145)

## 2015-04-22 LAB — CBC
HCT: 27.5 % — ABNORMAL LOW (ref 39.0–52.0)
Hemoglobin: 9.6 g/dL — ABNORMAL LOW (ref 13.0–17.0)
MCH: 30.9 pg (ref 26.0–34.0)
MCHC: 34.9 g/dL (ref 30.0–36.0)
MCV: 88.4 fL (ref 78.0–100.0)
Platelets: 128 10*3/uL — ABNORMAL LOW (ref 150–400)
RBC: 3.11 MIL/uL — ABNORMAL LOW (ref 4.22–5.81)
RDW: 13.9 % (ref 11.5–15.5)
WBC: 9 10*3/uL (ref 4.0–10.5)

## 2015-04-22 LAB — GLUCOSE, CAPILLARY
Glucose-Capillary: 97 mg/dL (ref 65–99)
Glucose-Capillary: 105 mg/dL — ABNORMAL HIGH (ref 65–99)

## 2015-04-22 MED ORDER — ATORVASTATIN CALCIUM 80 MG PO TABS: 80.0000 mg | ORAL_TABLET | Freq: Every day | ORAL | Status: DC

## 2015-04-22 MED ORDER — ASPIRIN 325 MG PO TBEC: 325.0000 mg | DELAYED_RELEASE_TABLET | Freq: Every day | ORAL | Status: DC

## 2015-04-22 MED ORDER — LISINOPRIL 5 MG PO TABS
5.0000 mg | ORAL_TABLET | Freq: Every day | ORAL | Status: DC
  Administered 2015-04-22: 5 mg via ORAL
  Filled 2015-04-22: qty 1

## 2015-04-22 MED ORDER — AMIODARONE HCL 200 MG PO TABS: 200.0000 mg | ORAL_TABLET | Freq: Two times a day (BID) | ORAL | Status: DC

## 2015-04-22 MED ORDER — TRAMADOL HCL 50 MG PO TABS: 50.0000 mg | ORAL_TABLET | ORAL | Status: DC | PRN

## 2015-04-22 MED ORDER — GUAIFENESIN ER 600 MG PO TB12: 600.0000 mg | ORAL_TABLET | Freq: Two times a day (BID) | ORAL | Status: DC | PRN

## 2015-04-22 MED ORDER — LISINOPRIL 5 MG PO TABS: 5.0000 mg | ORAL_TABLET | Freq: Every day | ORAL | Status: DC

## 2015-04-22 MED ORDER — ISOSORBIDE MONONITRATE ER 30 MG PO TB24: 30.0000 mg | ORAL_TABLET | Freq: Every day | ORAL | Status: DC

## 2015-04-22 NOTE — Discharge Summary (Addendum)
Physician Discharge Summary  Patient ID: [name] MRN: [id number] DOB/AGE: 09/20/1965 50 y.o.  Admit date: 04/15/2015 Discharge date: 04/22/2015  Admission Diagnoses:  Patient Active Problem List   Diagnosis Date Noted  . CAD (coronary artery disease)   . Hypokalemia 04/15/2015  . Essential (primary) hypertension 04/15/2015  . NSTEMI (non-ST elevated myocardial infarction) (HCC) 04/15/2015  . Tobacco abuse 04/15/2015   Discharge Diagnoses:   Patient Active Problem List   Diagnosis Date Noted  . CAD (coronary artery disease)   . 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 Hypokalemia 04/15/2015  . Essential (primary) hypertension 04/15/2015  . NSTEMI (non-ST elevated myocardial infarction) (HCC) 04/15/2015  . Tobacco abuse 04/15/2015   Discharged Condition: good  History of Present Illness:  [name] is a 50 yo male with no known CAD, but history of HTN, depression, anxiety, and H/O back surgery.  He presented to the ED on 04/15/2015 with complaints of chest pain.  He states the pain started around 3 AM with radiation to his left arm and was associated with nausea, shortness of breath, and diaphoresis.  It originally eased off, but later came back around 0800 and was severe the patient called EMS.  Workup in the ED showed no EKG changes, but his Troponin level was elevated.  He was ruled in for NSTEMI and admitted to the Cardiology service on IV Heparin and Nitroglycerin.    Hospital Course:   He was taken for cardiac catheterization the day of presentation.  This showed multivessel CAD and it was felt coronary bypass grafting would be indicated and TCTS consult was obtained.  He was evaluated by Dr. Tyrone Sage on 04/15/2015 at which time he was in agreement the patient would benefit from CABG procedure.  The risks and benefits of the procedure were explained to the patient and she was agreeable to proceed.  He remained chest pain free during admission.  He was taken to the operating room  on 04/18/2015.  He underwent CABG x 4 utilizing LIMA to LAD, Left radial artery to OM, SVG to Intermediate, and SVG to PD.  He also underwent open harvest of left radial artery and endoscopic harvest of greater saphenous vein from right thigh.  He tolerated the procedure without difficulty and was taken to the SICU in stable condition.  He was extubated the evening of surgery.  During his stay in the SICU the patients chest tubes and arterial lines were removed without difficulty.  He was maintaining NSR and oral Amiodarone.  He was aggressively diuresed for hypervolemia.  He was ambulating in the SICU and felt medically stable for transfer to the step down unit.  The patient has continued to make progress.  He is maintaining NSR and his pacing wires have been removed.  He is Imdur for his Radial artery graft.  He continues to ambulate without difficulty.  He was mildly hypertensive and started on an ACE inhibitor.  His pain remains well controlled.  He is tolerating a heart healthy diet.  He is felt medically stable for discharge home today.     Significant Diagnostic Studies: angiography:    Mid Cx lesion, 95% stenosed.  Mid Cx to Dist Cx lesion, 95% stenosed.  Prox LAD to Mid LAD lesion, 50% stenosed.  Ost Ramus lesion, 60% stenosed.  Prox Cx lesion, 95% stenosed.  Prox RCA-1 lesion, 75% stenosed.  Prox RCA-2 lesion, 80% stenosed.  Mid RCA lesion, 90% stenosed.  Dist RCA lesion, 60% stenosed.  RPDA lesion, 60% stenosed.  The left ventricular systolic function is normal.  Treatments: surgery:   Coronary artery bypass grafting x4 with left internal mammary to the left anterior descending coronary artery. Left radial coronary artery grafted to the circumflex coronary artery. Reverse saphenous vein graft to the intermediate coronary artery. Reverse saphenous vein graft to the posterior descending coronary artery with right greater saphenous vein harvesting endoscopically from the right  thigh and open harvesting of the left radial artery.  Disposition: 01-Home or Self Care   Discharge medications:  The patient has been discharged on:   1.Beta Blocker:  Yes [ x  ]                              No   [   ]                              If No, reason:  2.Ace Inhibitor/ARB: Yes [ x  ]                                     No  [    ]                                     If No, reason:  3.Statin:   Yes [  x ]                  No  [   ]                  If No, reason:  4.Ecasa:  Yes  [ x  ]                  No   [   ]                  If No, reason:      Medication List    STOP taking these medications        amLODipine 10 MG tablet  Commonly known as:  NORVASC     hydrochlorothiazide 25 MG tablet  Commonly known as:  HYDRODIURIL     valsartan 320 MG tablet  Commonly known as:  DIOVAN      TAKE these medications        amiodarone 200 MG tablet  Commonly known as:  PACERONE  Take 1 tablet (200 mg total) by mouth every 12 (twelve) hours. For 5 days, then decrease to 200 mg daily     aspirin 325 MG EC tablet  Take 1 tablet (325 mg total) by mouth daily.     atorvastatin 80 MG tablet  Commonly known as:  LIPITOR  Take 1 tablet (80 mg total) by mouth daily at 6 PM.     FLUoxetine 20 MG capsule  Commonly known as:  PROZAC  Take 30 mg by mouth daily.     guaiFENesin 600 MG 12 hr tablet  Commonly known as:  MUCINEX  Take 1 tablet (600 mg total) by mouth every 12 (twelve) hours as needed for cough.     isosorbide mononitrate 30 MG 24 hr tablet  Commonly known as:  IMDUR  Take 1 tablet (30 mg total) by mouth daily.  lisinopril 5 MG tablet  Commonly known as:  PRINIVIL,ZESTRIL  Take 1 tablet (5 mg total) by mouth daily.     nebivolol 10 MG tablet  Commonly known as:  BYSTOLIC  Take 10 mg by mouth daily.     predniSONE 10 MG (48) Tbpk tablet  Commonly known as:  STERAPRED UNI-PAK 48 TAB  Take 1-6 tablets by mouth daily. 12 day dose pack.  6,6,5,5,4,4,3,3,2,2,1,1     tamsulosin 0.4 MG Caps capsule  Commonly known as:  FLOMAX  Take 0.4 mg by mouth daily.     traMADol 50 MG tablet  Commonly known as:  ULTRAM  Take 50 mg by mouth every 6 (six) hours as needed for moderate pain.     traMADol 50 MG tablet  Commonly known as:  ULTRAM  Take 1-2 tablets (50-100 mg total) by mouth every 4 (four) hours as needed for moderate pain.       Follow-up Information    Follow up with Delight Ovens, MD On 05/29/2015.   Specialty:  Cardiothoracic Surgery   Why:  Appointment is a 9:00   Contact information:   [address] [employer] [address] [place] [address] [phone]       Follow up with [facility] On 05/01/2015.   Why:  please get CXR at 8:30, located of 1st floor of [facility]   Contact information:   [facility]       Follow up with Janetta Hora, PA-C On 05/05/2015.   Specialties:  Cardiology, Radiology   Why:  Appointment is at 11:00   Contact information:   [address] [place] [address] [phone]       Signed: Lowella Dandy 04/22/2015, 3:16 PM

## 2015-04-22 NOTE — Progress Notes (Signed)
CARDIAC REHAB PHASE I   Noted pt has discharge summary in computer, pt states he was told he could possibly go home today. Cardiac surgery discharge education completed with pt and wife at bedside. Reviewed risk factors, tobacco cessation, IS, sternal precautions, activity progression, exercise guidelines, sodium restrictions, daily weights and phase 2 cardiac rehab. Pt verbalized understanding, receptive to education. Pt has watched cardiac surgery videos and has read books. Pt agrees to phase 2 cardiac rehab referral, will send to [facility]. Pt in bed, call bell within reach.   [phone] Joylene Grapes, RN, BSN 04/22/2015 11:15 AM

## 2015-04-22 NOTE — Progress Notes (Signed)
CARDIAC REHAB PHASE I   PRE:  Rate/Rhythm: 70 SR  BP:  Sitting: 126/89        SaO2: 91 RA  MODE:  Ambulation: 300 ft   POST:  Rate/Rhythm: 91 SR  BP:  Sitting: 145/92         SaO2: 90 RA  Pt ambulated 300 ft on RA, hand held assist, mostly steady gait, tolerated well. Pt c/o DOE, chronic back and leg pain, but able to ambulate without walker, sats 90% on RA, 93% on RA after 1 minute rest. Pt declined rest stop, no other complaints. Pt to bed after walk for removal of EPW, call bell within reach. Encouraged ambulation, IS, pt verbalized understanding. Will follow.   [id number] Joylene Grapes, RN, BSN 04/22/2015 10:37 AM

## 2015-04-22 NOTE — Discharge Instructions (Signed)
Coronary Artery Bypass Grafting, Care After °Refer to this sheet in the next few weeks. These instructions provide you with information on caring for yourself after your procedure. Your health care provider may also give you more specific instructions. Your treatment has been planned according to current medical practices, but problems sometimes occur. Call your health care provider if you have any problems or questions after your procedure. °WHAT TO EXPECT AFTER THE PROCEDURE °Recovery from surgery will be different for everyone. Some people feel well after 3 or 4 weeks, while for others it takes longer. After your procedure, it is typical to have the following: °· Nausea and a lack of appetite.   °· Constipation. °· Weakness and fatigue.   °· Depression or irritability.   °· Pain or discomfort at your incision site. °HOME CARE INSTRUCTIONS °· Take medicines only as directed by your health care provider. Do not stop taking medicines or start any new medicines without first checking with your health care provider. °· Take your pulse as directed by your health care provider. °· Perform deep breathing as directed by your health care provider. If you were given a device called an incentive spirometer, use it to practice deep breathing several times a day. Support your chest with a pillow or your arms when you take deep breaths or cough. °· Keep incision areas clean, dry, and protected. Remove or change any bandages (dressings) only as directed by your health care provider. You may have skin adhesive strips over the incision areas. Do not take the strips off. They will fall off on their own. °· Check incision areas daily for any swelling, redness, or drainage. °· If incisions were made in your legs, do the following: °¨ Avoid crossing your legs.   °¨ Avoid sitting for long periods of time. Change positions every 30 minutes.   °¨ Elevate your legs when you are sitting. °· Wear compression stockings as directed by your  health care provider. These stockings help keep blood clots from forming in your legs. °· Take showers once your health care provider approves. Until then, only take sponge baths. Pat incisions dry. Do not rub incisions with a washcloth or towel. Do not take baths, swim, or use a hot tub until your health care provider approves. °· Eat foods that are high in fiber, such as raw fruits and vegetables, whole grains, beans, and nuts. Meats should be lean cut. Avoid canned, processed, and fried foods. °· Drink enough fluid to keep your urine clear or pale yellow. °· Weigh yourself every day. This helps identify if you are retaining fluid that may make your heart and lungs work harder. °· Rest and limit activity as directed by your health care provider. You may be instructed to: °¨ Stop any activity at once if you have chest pain, shortness of breath, irregular heartbeats, or dizziness. Get help right away if you have any of these symptoms. °¨ Move around frequently for short periods or take short walks as directed by your health care provider. Increase your activities gradually. You may need physical therapy or cardiac rehabilitation to help strengthen your muscles and build your endurance. °¨ Avoid lifting, pushing, or pulling anything heavier than 10 lb (4.5 kg) for at least 6 weeks after surgery. °· Do not drive until your health care provider approves.  °· Ask your health care provider when you may return to work. °· Ask your health care provider when you may resume sexual activity. °· Keep all follow-up visits as directed by your health care   provider. This is important. °SEEK MEDICAL CARE IF: °· You have swelling, redness, increasing pain, or drainage at the site of an incision. °· You have a fever. °· You have swelling in your ankles or legs. °· You have pain in your legs.   °· You gain 2 or more pounds (0.9 kg) a day. °· You are nauseous or vomit. °· You have diarrhea.  °SEEK IMMEDIATE MEDICAL CARE IF: °· You have  chest pain that goes to your jaw or arms. °· You have shortness of breath.   °· You have a fast or irregular heartbeat.   °· You notice a "clicking" in your breastbone (sternum) when you move.   °· You have numbness or weakness in your arms or legs. °· You feel dizzy or light-headed.   °MAKE SURE YOU: °· Understand these instructions. °· Will watch your condition. °· Will get help right away if you are not doing well or get worse. °  °This information is not intended to replace advice given to you by your health care provider. Make sure you discuss any questions you have with your health care provider. °  °Document Released: 09/25/2004 Document Revised: 03/29/2014 Document Reviewed: 08/15/2012 °Elsevier Interactive Patient Education ©2016 [employer] ° °Endoscopic Saphenous Vein Harvesting, Care After °Refer to this sheet in the next few weeks. These instructions provide you with information on caring for yourself after your procedure. Your health care provider may also give you more specific instructions. Your treatment has been planned according to current medical practices, but problems sometimes occur. Call your health care provider if you have any problems or questions after your procedure. °HOME CARE INSTRUCTIONS °Medicine °· Take whatever pain medicine your surgeon prescribes. Follow the directions carefully. Do not take over-the-counter pain medicine unless your surgeon says it is okay. Some pain medicine can cause bleeding problems for several weeks after surgery. °· Follow your surgeon's instructions about driving. You will probably not be permitted to drive after heart surgery. °· Take any medicines your surgeon prescribes. Any medicines you took before your heart surgery should be checked with your health care provider before you start taking them again. °Wound care °· If your surgeon has prescribed an elastic bandage or stocking, ask how long you should wear it. °· Check the area around your surgical  cuts (incisions) whenever your bandages (dressings) are changed. Look for any redness or swelling. °· You will need to return to have the stitches (sutures) or staples taken out. Ask your surgeon when to do that. °· Ask your surgeon when you can shower or bathe. °Activity °· Try to keep your legs raised when you are sitting. °· Do any exercises your health care providers have given you. These may include deep breathing exercises, coughing, walking, or other exercises. °SEEK MEDICAL CARE IF: °· You have any questions about your medicines. °· You have more leg pain, especially if your pain medicine stops working. °· New or growing bruises develop on your leg. °· Your leg swells, feels tight, or becomes red. °· You have numbness in your leg. °SEEK IMMEDIATE MEDICAL CARE IF: °· Your pain gets much worse. °· Blood or fluid leaks from any of the incisions. °· Your incisions become warm, swollen, or red. °· You have chest pain. °· You have trouble breathing. °· You have a fever. °· You have more pain near your leg incision. °MAKE SURE YOU: °· Understand these instructions. °· Will watch your condition. °· Will get help right away if you are not doing well or   get worse. °  °This information is not intended to replace advice given to you by your health care provider. Make sure you discuss any questions you have with your health care provider. °  °Document Released: 11/18/2010 Document Revised: 03/29/2014 Document Reviewed: 11/18/2010 °Elsevier Interactive Patient Education ©2016 Else[employer] Inc. ° ° °

## 2015-04-22 NOTE — Progress Notes (Signed)
Pacing wires removed. Pt tolerated it well. Pt educated on importance of bed rest. Vital signs taken. Will continue to monitor.

## 2015-04-22 NOTE — Care Management Note (Signed)
Case Management Note  Patient Details  [name] MRN: [id number] Date of Birth: 1965-07-09  Subjective/Objective:     Pt is a very pleasant 50 year old gentleman who presented with chest pain. History of hyperlipidemia, hypertension and exertional chest pain in the last few weeks. His left cardiac cath yesterday showed diffuse 3 vessel disease, he was evaluated by CT surgery had CABG on Friday 04/18/15. Pt is from home with family support of wife, wife will provide 24 hour supervision post discharge.               Updated Action Plan:  CM assessed pt on 2W, pt no longer feels HHPT will be needed.  CM will continue to monitor for disposition needs  Action/Plan: Per speaking with pt- he has a Herniated Disc and pt may benefit from HHPT services once stable for d/c. CM will continue to monitor for additional needs.    Expected Discharge Date:                  Expected Discharge Plan:  Home w Home Health Services  In-House Referral:     Discharge planning Services  CM Consult  Post Acute Care Choice:  Home Health Choice offered to:     DME Arranged:    DME Agency:     HH Arranged:   HH Agency:     Status of Service:  Complete, will sign off  Medicare Important Message Given:    Date Medicare IM Given:    Medicare IM give by:    Date Additional Medicare IM Given:    Additional Medicare Important Message give by:     If discussed at Long Length of Stay Meetings, dates discussed:    Additional Comments: Pt will discharge home in care of wife, CM assessed prior to discharge and no CM needs identified                           Cherylann Parr, RN 04/22/2015, 3:21 PM

## 2015-04-22 NOTE — Progress Notes (Signed)
[address]       [place] [address]             [phone]      4 Days Post-Op Procedure(s) (LRB): CORONARY ARTERY BYPASS GRAFTING times four with LIMA  to LAD, LEFT RADIAL ARTERY to OM, Right saphenous vein harvesting andutilized to Interm and to PD ENDOSCOPIC GREATER SAPHENOUS VEIN HARVEST (EVH) RIGHT THIGH ([name]) TRANSESOPHAGEAL ECHOCARDIOGRAM (TEE) ([name])  LEFT Arm RADIAL ARTERY HARVEST (Left) Subjective: Feels well  Objective: Vital signs in last 24 hours: Temp:  [97.9 F (36.6 C)-98.2 F (36.8 C)] 98.2 F (36.8 C) (01/31 0520) Pulse Rate:  [66-72] 66 (01/30 2042) Cardiac Rhythm:  [-] Normal sinus rhythm (01/31 0520) Resp:  [13-24] 19 (01/30 2042) BP: (109-139)/(64-82) 125/76 mmHg (01/31 0520) SpO2:  [93 %-99 %] 93 % (01/31 0520) Weight:  [249 lb 1.9 oz (113 kg)] 249 lb 1.9 oz (113 kg) (01/31 0520)  Hemodynamic parameters for last 24 hours:    Intake/Output from previous day: 01/30 0701 - 01/31 0700 In: 240 [P.O.:240] Out: -  Intake/Output this shift:    General appearance: alert, cooperative and no distress Heart: regular rate and rhythm Lungs: dim with some crackles in the bases Abdomen: benign Extremities: + LE edema Wound: incishealing well, left arm NV intact  Lab Results:  Recent Labs  04/20/15 0315 04/22/15 0333  WBC 11.9* 9.0  HGB 9.8* 9.6*  HCT 28.9* 27.5*  PLT 102* 128*   BMET:  Recent Labs  04/21/15 0345 04/22/15 0333  NA 137 137  K 4.1 4.1  CL 103 102  CO2 28 25  GLUCOSE 126* 114*  BUN 24* 28*  CREATININE 0.85 0.94  CALCIUM 8.6* 8.7*    PT/INR: No results for input(s): LABPROT, INR in the last 72 hours. ABG    Component Value Date/Time   PHART 7.355 04/19/2015 0419   HCO3 23.3 04/19/2015 0419   TCO2 23 04/19/2015 1710   ACIDBASEDEF 2.0 04/19/2015 0419   O2SAT 94.0 04/19/2015 0419   CBG (last 3)   Recent Labs  04/21/15 1622 04/21/15 2039 04/22/15 0621  GLUCAP 116* 116* 97     Meds Scheduled Meds: . amiodarone  200 mg Oral Q12H   Followed by  . [START ON 04/26/2015] amiodarone  200 mg Oral Daily  . antiseptic oral rinse  7 mL Mouth Rinse BID  . aspirin EC  325 mg Oral Daily  . atorvastatin  80 mg Oral q1800  . docusate sodium  200 mg Oral Daily  . enoxaparin (LOVENOX) injection  40 mg Subcutaneous QHS  . FLUoxetine  30 mg Oral Daily  . furosemide  40 mg Oral Daily  . insulin aspart  0-24 Units Subcutaneous TID AC & HS  . isosorbide mononitrate  30 mg Oral Daily  . nebivolol  10 mg Oral Daily  . pantoprazole  40 mg Oral QAC breakfast  . predniSONE  20 mg Oral Q breakfast   Followed by  . [START ON 04/24/2015] predniSONE  10 mg Oral Q breakfast  . sodium chloride flush  3 mL Intravenous Q12H  . tamsulosin  0.4 mg Oral Daily   Continuous Infusions:  PRN Meds:.sodium chloride, acetaminophen, bisacodyl **OR** bisacodyl, guaiFENesin, levalbuterol, ondansetron **OR** ondansetron (ZOFRAN) IV, sodium chloride flush, traMADol  Xrays Dg Chest 2 View  04/22/2015  CLINICAL DATA:  Open heart surgery . EXAM: CHEST  2 VIEW COMPARISON:  04/20/2015. FINDINGS: Interim removal right IJ sheath. Prior CABG.  Cardiomegaly with normal pulmonary vascularity. Lung volumes with bibasilar atelectasis. Left lower lobe infiltrate cannot be excluded. No pleural effusion or pneumothorax. IMPRESSION: 1. Interim removal of right IJ sheath. 2. Low lung volumes with bibasilar atelectasis. Left lower lobe infiltrate cannot be excluded. 3. Prior CABG.  Stable cardiomegaly. Electronically Signed   By: Maisie Fus  Register   On: 04/22/2015 07:19    Assessment/Plan: S/P Procedure(s) (LRB): CORONARY ARTERY BYPASS GRAFTING times four with LIMA  to LAD, LEFT RADIAL ARTERY to OM, Right saphenous vein harvesting andutilized to Interm and to PD ENDOSCOPIC GREATER SAPHENOUS VEIN HARVEST (EVH) RIGHT THIGH ([name]) TRANSESOPHAGEAL ECHOCARDIOGRAM (TEE) ([name])  LEFT Arm RADIAL ARTERY HARVEST (Left)  1 doing  well 2 SR , hemodyn stable 3 cont to diurese 4 Imdur for radial artery 5 pulm toilet /rehab 6 d/c epw's 7 add low dose ACEI 9 poss home in am  LOS: 7 days    GOLD,WAYNE E 04/22/2015

## 2015-04-23 ENCOUNTER — Encounter: Payer: Self-pay | Admitting: Physician Assistant

## 2015-04-29 NOTE — Progress Notes (Signed)
Cardiology Office Note   Date:  05/05/2015   ID:  [name], DOB 26-Dec-1965, MRN [id number]  PCP:  Jeffery Soho, PA-C  Cardiologist:  Dr. Delton See  Post hospital follow up- CABG   History of Present Illness: [name] is a 50 y.o. male with a history of HTN, tobacco abuse, depression/anxiety, family history of heart disease and recently diagnosed CAD S/P CABG who presents for post hospital follow-up.   He was admitted 1/24-1/31/17. He presented to the ED on 04/15/2015 with complaints of chest pain and ruled in for NSTEMI. He was taken for cardiac catheterization the day of presentation which showed multivessel CAD with normal EF and it was felt CABG was indicated. He was evaluated by Dr. Tyrone Sage on 04/15/2015 at which time he was in agreement the patient would benefit from CABG procedure. He was taken to the operating room on 04/18/2015 and underwent CABG x 4 utilizing LIMA to LAD, Left radial artery to OM, SVG to Intermediate, and SVG to PD. He also underwent open harvest of left radial artery and endoscopic harvest of greater saphenous vein from right thigh.  He was aggressively diuresed for hypervolemia. He maintained NSR on amiodarone.  He was mildly hypertensive and started on a started on an ACE inhibitor.  Today he presents to clinic for post hospital follow-up. He denies chest pain. He has been walking about 1 mile everyday and get some dyspnea. Very tired and wiped out. He has changed his diet and has been eating lots of fruits and veggies and watching his salt. No blood in his stool or urine. No dizziness or syncope. He denies palpations. He has been having weird dreams and wonders if it was related to any of the medications. He has been having some intermittent LE edema in the leg where they harvested the vein graft but not orthopnea or PND.     Past Medical History  Diagnosis Date  . Hypertension   . Depression   . Anxiety   . Hyperlipidemia   . NSTEMI  (non-ST elevated myocardial infarction) (HCC) 04/15/2015  . Lumbar herniated disc dx'd 03/2015    Past Surgical History  Procedure Laterality Date  . Cardiac catheterization  04/15/2015  . Tonsillectomy  1960s  . Back surgery    . Lumbar laminectomy  2002  . Cardiac catheterization [name] 04/15/2015    Procedure: Left Heart Cath and Coronary Angiography;  Surgeon: Runell Gess, MD;  [facility]: [facility] INVASIVE CV LAB;  Service: Cardiovascular;  Laterality: [name];  . Coronary artery bypass graft [name] 04/18/2015    Procedure: CORONARY ARTERY BYPASS GRAFTING times four with LIMA  to LAD, LEFT RADIAL ARTERY to OM, Right saphenous vein harvesting andutilized to Interm and to PD ENDOSCOPIC GREATER SAPHENOUS VEIN HARVEST (EVH) RIGHT THIGH;  Surgeon: Delight Ovens, MD;  Location: [facility] OR;  Service: Open Heart Surgery;  Laterality: [name];  . Tee without cardioversion [name] 04/18/2015    Procedure: TRANSESOPHAGEAL ECHOCARDIOGRAM (TEE);  Surgeon: Delight Ovens, MD;  Location: [facility] OR;  Service: Open Heart Surgery;  Laterality: [name];  . Radial artery harvest Left 04/18/2015    Procedure:  LEFT Arm RADIAL ARTERY HARVEST;  Surgeon: Delight Ovens, MD;  Location: [facility] OR;  Service: Open Heart Surgery;  Laterality: Left;     Current Outpatient Prescriptions  Medication Sig Dispense Refill  . amiodarone (PACERONE) 200 MG tablet Take 200 mg by mouth daily.  1  . aspirin EC 325 MG EC tablet Take 1 tablet (  325 mg total) by mouth daily. 30 tablet 0  . atorvastatin (LIPITOR) 80 MG tablet Take 1 tablet (80 mg total) by mouth daily at 6 PM. 30 tablet 3  . FLUoxetine (PROZAC) 20 MG capsule Take 30 mg by mouth daily.     Marland Kitchen guaiFENesin (MUCINEX) 600 MG 12 hr tablet Take 1 tablet (600 mg total) by mouth every 12 (twelve) hours as needed for cough.    . isosorbide mononitrate (IMDUR) 30 MG 24 hr tablet Take 1 tablet (30 mg total) by mouth daily. 30 tablet 0  . lisinopril (PRINIVIL,ZESTRIL) 5 MG tablet Take 1 tablet (5 mg total)  by mouth daily. 30 tablet 3  . nebivolol (BYSTOLIC) 10 MG tablet Take 10 mg by mouth daily.    . tamsulosin (FLOMAX) 0.4 MG CAPS capsule Take 0.4 mg by mouth daily.  5  . traMADol (ULTRAM) 50 MG tablet Take 1-2 tablets (50-100 mg total) by mouth every 4 (four) hours as needed for moderate pain. 30 tablet 0   No current facility-administered medications for this visit.    Allergies:   Claritin; Codeine; and Vicodin    Social History:  The patient  reports that he quit smoking about 2 weeks ago. His smoking use included Cigarettes. He has a 49.5 pack-year smoking history. He has never used smokeless tobacco. He reports that he drinks alcohol. He reports that he uses illicit drugs (Marijuana and Cocaine).   Family History:  The patient's family history includes Cancer in his father; Diabetes in his sister; Heart attack (age of onset: 31) in his father.    ROS:  Please see the history of present illness.   Otherwise, review of systems are positive for none.   All other systems are reviewed and negative.    PHYSICAL EXAM: VS:  BP 166/90 mmHg  Pulse 56  Ht 6' (1.829 m)  Wt 252 lb 1.9 oz (114.361 kg)  BMI 34.19 kg/m2 , BMI Body mass index is 34.19 kg/(m^2). GEN: Well nourished, well developed, in no acute distressobese HEENT: normal Neck: no JVD, carotid bruits, or masses Cardiac: RRR; no murmurs, rubs, or gallops,no edema  Respiratory:  clear to auscultation bilaterally, normal work of breathing GI: soft, nontender, nondistended, + BS MS: no deformity or atrophy Skin: warm and dry, no rash Neuro:  Strength and sensation are intact Psych: euthymic mood, full affect   EKG:  EKG is ordered today. The ekg ordered today demonstrates sinus brady HR56   Recent Labs: 04/17/2015: ALT 25 04/19/2015: Magnesium 2.4; TSH 2.112 04/22/2015: BUN 28*; Creatinine, Ser 0.94; Hemoglobin 9.6*; Platelets 128*; Potassium 4.1; Sodium 137    Lipid Panel    Component Value Date/Time   CHOL 243*  04/16/2015 0020   TRIG 389* 04/16/2015 0020   HDL 38* 04/16/2015 0020   CHOLHDL 6.4 04/16/2015 0020   VLDL 78* 04/16/2015 0020   LDLCALC 127* 04/16/2015 0020      Wt Readings from Last 3 Encounters:  05/05/15 252 lb 1.9 oz (114.361 kg)  04/22/15 249 lb 1.9 oz (113 kg)      Other studies Reviewed: Additional studies/ records that were reviewed today include: [facility] Review of the above records demonstrates:    04/15/15 Procedures    Left Heart Cath and Coronary Angiography    Conclusion     Mid Cx lesion, 95% stenosed.  Mid Cx to Dist Cx lesion, 95% stenosed.  Prox LAD to Mid LAD lesion, 50% stenosed.  Ost Ramus lesion, 60% stenosed.  Prox  Cx lesion, 95% stenosed.  Prox RCA-1 lesion, 75% stenosed.  Prox RCA-2 lesion, 80% stenosed.  Mid RCA lesion, 90% stenosed.  Dist RCA lesion, 60% stenosed.  RPDA lesion, 60% stenosed.  The left ventricular systolic function is normal.     2D ECHO: 04/18/2015 LV EF: 50 - 55% Study Conclusions - Left ventricle: Systolic function was normal. The estimated ejection fraction was in the range of 50% to 55%. - Aortic valve: No evidence of vegetation. - Mitral valve: No evidence of vegetation. - Left atrium: No evidence of thrombus in the atrial cavity or appendage. No evidence of thrombus in the appendage. - Atrial septum: No defect or patent foramen ovale was identified. - Pulmonic valve: No evidence of vegetation.   ASSESSMENT AND PLAN: [name] is a 50 y.o. male with a history of HTN, tobacco abuse, depression/anxiety and recently diagnosed CAD status S/P CABG who presents for post hospital follow-up.   CAD s/p CABG: stable. Continue ASA  daily, BB and statin. He did have NSTEMI followed by CABG. He may be a candidate for DAPT with ASA and plavix but will defer to Dr. Delton See  ?PAF?: patient was placed on amiodarone. I dont see any ECGs with afib or notes that mention he went out of rhythm. Possibly put  on prophylactic measures. Will defer to surgery on time course of this.   HTN: BP is a little elevated today (166/90) but he is nervous. He watches his BP at home and it usually runs in 130s/80s. He will continue to monitor this at home. For now continue lisinopril  daily, imdur  daily, and Bystolic  daily. He will call us if BP remains elevated and we can increase the lisinopril to    HLD: LDL 127. Goal <70. Continue atorvasatin   Tobacco abuse: he has quit. Counseled continued cessation.    Current medicines are reviewed at length with the patient today.  The patient does not have concerns regarding medicines.  The following changes have been made:  no change  Labs/ tests ordered today include:   Orders Placed This Encounter  Procedures  . EKG 12-Lead     Disposition:   FU with Dr. Delton See in 3 months.   Charlestine Massed  05/05/2015 11:41 AM    [facility] [address] [place], [place], [place]  [address] Phone: [phone]; Fax: [phone]

## 2015-05-02 ENCOUNTER — Other Ambulatory Visit: Payer: Self-pay | Admitting: Physician Assistant

## 2015-05-02 ENCOUNTER — Encounter: Payer: Self-pay | Admitting: *Deleted

## 2015-05-02 ENCOUNTER — Telehealth: Payer: Self-pay | Admitting: *Deleted

## 2015-05-02 DIAGNOSIS — G8918 Other acute postprocedural pain: Secondary | ICD-10-CM

## 2015-05-02 NOTE — Telephone Encounter (Signed)
Received a refill request for Tramadol via e mail from his pharmacy.  I faxed a new signed script.

## 2015-05-05 ENCOUNTER — Ambulatory Visit (INDEPENDENT_AMBULATORY_CARE_PROVIDER_SITE_OTHER): Payer: BLUE CROSS/BLUE SHIELD | Admitting: Physician Assistant

## 2015-05-05 ENCOUNTER — Encounter: Payer: Self-pay | Admitting: Physician Assistant

## 2015-05-05 VITALS — BP 166/90 | HR 56 | Ht 72.0 in | Wt 252.1 lb

## 2015-05-05 DIAGNOSIS — I214 Non-ST elevation (NSTEMI) myocardial infarction: Secondary | ICD-10-CM | POA: Diagnosis not present

## 2015-05-05 DIAGNOSIS — I10 Essential (primary) hypertension: Secondary | ICD-10-CM

## 2015-05-05 NOTE — Patient Instructions (Addendum)
Medication Instructions:  Your physician recommends that you continue on your current medications as directed. Please refer to the Current Medication list given to you today.  Lab work: NONE  Testing/Procedures: NONE  Follow-Up: Your physician wants you to follow-up in: 3 months with Dr. Delton See.  Your physician has requested that you regularly monitor and record your blood pressure readings at home. Please use the same machine at the same time of day to check your readings and record them to bring to your follow-up visit. Please call if your blood pressure is over 140/90.    If you need a refill on your cardiac medications before your next appointment, please call your pharmacy.

## 2015-05-13 ENCOUNTER — Other Ambulatory Visit: Payer: Self-pay | Admitting: Physician Assistant

## 2015-05-13 ENCOUNTER — Other Ambulatory Visit: Payer: Self-pay | Admitting: Thoracic Surgery (Cardiothoracic Vascular Surgery)

## 2015-05-13 DIAGNOSIS — G8918 Other acute postprocedural pain: Secondary | ICD-10-CM

## 2015-05-13 NOTE — Telephone Encounter (Signed)
Received a refill request from Mr. Kehm's pharmacy via Surescript interface for Tramadol.  A new signed script was faxed to the requesting pharmacy.

## 2015-05-19 ENCOUNTER — Inpatient Hospital Stay (HOSPITAL_COMMUNITY)
Admission: EM | Admit: 2015-05-19 | Discharge: 2015-05-21 | DRG: 281 | Disposition: A | Discharge status: Home / Self Care | Payer: BLUE CROSS/BLUE SHIELD | Attending: Cardiology | Admitting: Cardiology

## 2015-05-19 ENCOUNTER — Encounter (HOSPITAL_COMMUNITY): Payer: Self-pay | Admitting: Emergency Medicine

## 2015-05-19 ENCOUNTER — Emergency Department (HOSPITAL_COMMUNITY): Payer: BLUE CROSS/BLUE SHIELD

## 2015-05-19 DIAGNOSIS — I2510 Atherosclerotic heart disease of native coronary artery without angina pectoris: Secondary | ICD-10-CM | POA: Diagnosis present

## 2015-05-19 DIAGNOSIS — T82898A Other specified complication of vascular prosthetic devices, implants and grafts, initial encounter: Secondary | ICD-10-CM | POA: Diagnosis not present

## 2015-05-19 DIAGNOSIS — I214 Non-ST elevation (NSTEMI) myocardial infarction: Secondary | ICD-10-CM | POA: Diagnosis not present

## 2015-05-19 DIAGNOSIS — I10 Essential (primary) hypertension: Secondary | ICD-10-CM | POA: Diagnosis present

## 2015-05-19 DIAGNOSIS — F329 Major depressive disorder, single episode, unspecified: Secondary | ICD-10-CM | POA: Diagnosis present

## 2015-05-19 DIAGNOSIS — I161 Hypertensive emergency: Secondary | ICD-10-CM | POA: Diagnosis present

## 2015-05-19 DIAGNOSIS — Z885 Allergy status to narcotic agent status: Secondary | ICD-10-CM

## 2015-05-19 DIAGNOSIS — Y832 Surgical operation with anastomosis, bypass or graft as the cause of abnormal reaction of the patient, or of later complication, without mention of misadventure at the time of the procedure: Secondary | ICD-10-CM | POA: Diagnosis present

## 2015-05-19 DIAGNOSIS — Z951 Presence of aortocoronary bypass graft: Secondary | ICD-10-CM

## 2015-05-19 DIAGNOSIS — Z8249 Family history of ischemic heart disease and other diseases of the circulatory system: Secondary | ICD-10-CM

## 2015-05-19 DIAGNOSIS — Z888 Allergy status to other drugs, medicaments and biological substances status: Secondary | ICD-10-CM

## 2015-05-19 DIAGNOSIS — K219 Gastro-esophageal reflux disease without esophagitis: Secondary | ICD-10-CM | POA: Diagnosis present

## 2015-05-19 DIAGNOSIS — Z7982 Long term (current) use of aspirin: Secondary | ICD-10-CM

## 2015-05-19 DIAGNOSIS — F419 Anxiety disorder, unspecified: Secondary | ICD-10-CM | POA: Diagnosis present

## 2015-05-19 DIAGNOSIS — E785 Hyperlipidemia, unspecified: Secondary | ICD-10-CM | POA: Diagnosis present

## 2015-05-19 DIAGNOSIS — I220 Subsequent ST elevation (STEMI) myocardial infarction of anterior wall: Secondary | ICD-10-CM | POA: Diagnosis present

## 2015-05-19 DIAGNOSIS — Z79899 Other long term (current) drug therapy: Secondary | ICD-10-CM

## 2015-05-19 DIAGNOSIS — Z87891 Personal history of nicotine dependence: Secondary | ICD-10-CM

## 2015-05-19 LAB — CBC
HCT: 40.6 % (ref 39.0–52.0)
HEMOGLOBIN: 13.4 g/dL (ref 13.0–17.0)
MCH: 28 pg (ref 26.0–34.0)
MCHC: 33 g/dL (ref 30.0–36.0)
MCV: 84.9 fL (ref 78.0–100.0)
Platelets: 187 10*3/uL (ref 150–400)
RBC: 4.78 MIL/uL (ref 4.22–5.81)
RDW: 13.1 % (ref 11.5–15.5)
WBC: 6.6 10*3/uL (ref 4.0–10.5)

## 2015-05-19 LAB — BASIC METABOLIC PANEL
ANION GAP: 13 (ref 5–15)
BUN: 13 mg/dL (ref 6–20)
CALCIUM: 9.3 mg/dL (ref 8.9–10.3)
CO2: 25 mmol/L (ref 22–32)
Chloride: 101 mmol/L (ref 101–111)
Creatinine, Ser: 0.92 mg/dL (ref 0.61–1.24)
Glucose, Bld: 162 mg/dL — ABNORMAL HIGH (ref 65–99)
Potassium: 3.4 mmol/L — ABNORMAL LOW (ref 3.5–5.1)
Sodium: 139 mmol/L (ref 135–145)

## 2015-05-19 LAB — I-STAT TROPONIN, ED: TROPONIN I, POC: 0.02 ng/mL (ref 0.00–0.08)

## 2015-05-19 MED ORDER — PANTOPRAZOLE SODIUM 40 MG IV SOLR
40.0000 mg | Freq: Once | INTRAVENOUS | Status: AC
  Administered 2015-05-19: 40 mg via INTRAVENOUS
  Filled 2015-05-19: qty 40

## 2015-05-19 MED ORDER — HYDRALAZINE HCL 20 MG/ML IJ SOLN
5.0000 mg | Freq: Once | INTRAMUSCULAR | Status: AC
  Administered 2015-05-19: 5 mg via INTRAVENOUS
  Filled 2015-05-19: qty 1

## 2015-05-19 MED ORDER — GI COCKTAIL ~~LOC~~
30.0000 mL | Freq: Once | ORAL | Status: AC
  Administered 2015-05-19: 30 mL via ORAL
  Filled 2015-05-19: qty 30

## 2015-05-19 NOTE — ED Provider Notes (Signed)
CSN: [id number]     Arrival date & time 05/19/15  1837 History   By signing my name below, I, Arlan Organ, attest that this documentation has been prepared under the direction and in the presence of Shon Baton, MD.  Electronically Signed: Arlan Organ, ED Scribe. 05/19/2015. 11:36 PM.   Chief Complaint  Patient presents with  . Chest Pain  . Hypertension   The history is provided by the patient. No language interpreter was used.    HPI Comments: [name] is a 50 y.o. male with a PMHx of HTN, hyperlipidemia, and NSTEMI who presents to the Emergency Department complaining of constant, ongoing L sided chest pain onset 12:00 this afternoon. Pain is described as burning and rated 5/10. Pain is not similar to previous MI but states it feels somewhat like discomfort associated with acid reflux. No aggravating or alleviating factors at this time. Pt also reports ongoing nausea, recent elevated blood pressure readings, and HA. OTC Tums attempted at home without any improvement. No recent fever, chills, vomiting, abdominal pain, shortness of breath, diaphoresis, blurred vision, dysuria, or hematuria. PSHx includes Coronary artery bypass graft performed by Dr. Sheliah Plane on 04/18/15 and cardiac catheterization 04/15/15.  PCP: Jarrett Soho, PA-C    Past Medical History  Diagnosis Date  . Hypertension   . Depression   . Anxiety   . Hyperlipidemia   . NSTEMI (non-ST elevated myocardial infarction) (HCC) 04/15/2015  . Lumbar herniated disc dx'd 03/2015   Past Surgical History  Procedure Laterality Date  . Cardiac catheterization  04/15/2015  . Tonsillectomy  1960s  . Back surgery    . Lumbar laminectomy  2002  . Cardiac catheterization [name] 04/15/2015    Procedure: Left Heart Cath and Coronary Angiography;  Surgeon: Runell Gess, MD;  Location: [facility] INVASIVE CV LAB;  Service: Cardiovascular;  Laterality: [name];  . Coronary artery bypass graft [name] 04/18/2015    Procedure:  CORONARY ARTERY BYPASS GRAFTING times four with LIMA  to LAD, LEFT RADIAL ARTERY to OM, Right saphenous vein harvesting andutilized to Interm and to PD ENDOSCOPIC GREATER SAPHENOUS VEIN HARVEST (EVH) RIGHT THIGH;  Surgeon: Delight Ovens, MD;  Location: [facility] OR;  Service: Open Heart Surgery;  Laterality: [name];  . Tee without cardioversion [name] 04/18/2015    Procedure: TRANSESOPHAGEAL ECHOCARDIOGRAM (TEE);  Surgeon: Delight Ovens, MD;  Location: [facility] OR;  Service: Open Heart Surgery;  Laterality: [name];  . Radial artery harvest Left 04/18/2015    Procedure:  LEFT Arm RADIAL ARTERY HARVEST;  Surgeon: Delight Ovens, MD;  Location: [facility] OR;  Service: Open Heart Surgery;  Laterality: Left;   Family History  Problem Relation Age of Onset  . Heart attack Father 43    Deceased at this age  . Diabetes Sister   . Cancer Father     Lung   Social History  Substance Use Topics  . Smoking status: Former Smoker -- 1.50 packs/day for 33 years    Types: Cigarettes    Quit date: 04/15/2015  . Smokeless tobacco: Never Used  . Alcohol Use: 0.0 oz/week    0 Standard drinks or equivalent per week     Comment: 04/15/2015 "stopped drinking in ~ 11/2014"    Review of Systems  Constitutional: Negative for fever, chills and diaphoresis.  HENT: Negative for congestion.   Respiratory: Negative for cough and shortness of breath.   Cardiovascular: Positive for chest pain.  Gastrointestinal: Positive for nausea. Negative for vomiting and abdominal pain.  Genitourinary: Negative for dysuria and hematuria.  Neurological: Positive for headaches.  Psychiatric/Behavioral: Negative for confusion.  All other systems reviewed and are negative.     Allergies  Claritin; Codeine; and Vicodin  Home Medications   Prior to Admission medications   Medication Sig Start Date End Date Taking? Authorizing Provider  amiodarone (PACERONE) 200 MG tablet Take 200 mg by mouth daily. 04/22/15  Yes Historical Provider, MD   atorvastatin (LIPITOR) 80 MG tablet Take 1 tablet (80 mg total) by mouth daily at 6 PM. 04/22/15  Yes Erin R Barrett, PA-C  FLUoxetine (PROZAC) 20 MG capsule Take 30 mg by mouth daily.    Yes Historical Provider, MD  lisinopril (PRINIVIL,ZESTRIL) 5 MG tablet Take 1 tablet (5 mg total) by mouth daily. 04/22/15  Yes Erin R Barrett, PA-C  nebivolol (BYSTOLIC) 10 MG tablet Take 10 mg by mouth daily.   Yes Historical Provider, MD  tamsulosin (FLOMAX) 0.4 MG CAPS capsule Take 0.4 mg by mouth daily. 02/12/15  Yes Historical Provider, MD  aspirin EC 325 MG EC tablet Take 1 tablet (325 mg total) by mouth daily. Patient not taking: Reported on 05/20/2015 04/22/15   Erin R Barrett, PA-C  guaiFENesin (MUCINEX) 600 MG 12 hr tablet Take 1 tablet (600 mg total) by mouth every 12 (twelve) hours as needed for cough. Patient not taking: Reported on 05/20/2015 04/22/15   Erin R Barrett, PA-C  isosorbide mononitrate (IMDUR) 30 MG 24 hr tablet Take 1 tablet (30 mg total) by mouth daily. Patient not taking: Reported on 05/20/2015 04/22/15   Erin R Barrett, PA-C  traMADol (ULTRAM) 50 MG tablet TAKE 1 TO 2 TABLETS EVERY 4 HOURS AS NEEDED FOR MODERATE PAIN Patient not taking: Reported on 05/20/2015 05/13/15   Loreli Slot, MD   Triage Vitals: BP 165/109 mmHg  Pulse 70  Temp(Src) 98.8 F (37.1 C) (Oral)  Resp 21  Ht 6' (1.829 m)  Wt 252 lb 3.3 oz (114.4 kg)  BMI 34.20 kg/m2  SpO2 97%   Physical Exam  Constitutional: He is oriented to person, place, and time. He appears well-developed and well-nourished. No distress.  overweight  HENT:  Head: Normocephalic and atraumatic.  Cardiovascular: Normal rate, regular rhythm and normal heart sounds.   No murmur heard. Pulmonary/Chest: Effort normal and breath sounds normal. No respiratory distress. He has no wheezes.  Midline sternotomy scar well healing  Abdominal: Soft. Bowel sounds are normal. There is no tenderness. There is no rebound.  Musculoskeletal: He  exhibits no edema.  Neurological: He is alert and oriented to person, place, and time.  Skin: Skin is warm and dry.  Psychiatric: He has a normal mood and affect.  Nursing note and vitals reviewed.   ED Course  Procedures (including critical care time)  CRITICAL CARE Performed by: Shon Baton   Total critical care time: 30 minutes  Critical care time was exclusive of separately billable procedures and treating other patients.  Critical care was necessary to treat or prevent imminent or life-threatening deterioration.  Critical care was time spent personally by me on the following activities: development of treatment plan with patient and/or surrogate as well as nursing, discussions with consultants, evaluation of patient's response to treatment, examination of patient, obtaining history from patient or surrogate, ordering and performing treatments and interventions, ordering and review of laboratory studies, ordering and review of radiographic studies, pulse oximetry and re-evaluation of patient's condition.   DIAGNOSTIC STUDIES: Oxygen Saturation is 98% on RA, Normal by my  interpretation.    COORDINATION OF CARE: 11:05 PM-Discussed treatment plan with pt at bedside and pt agreed to plan.     Labs Review Labs Reviewed  BASIC METABOLIC PANEL - Abnormal; Notable for the following:    Potassium 3.4 (*)    Glucose, Bld 162 (*)    All other components within normal limits  BRAIN NATRIURETIC PEPTIDE - Abnormal; Notable for the following:    B Natriuretic Peptide 162.1 (*)    All other components within normal limits  I-STAT TROPOININ, ED - Abnormal; Notable for the following:    Troponin i, poc 3.86 (*)    All other components within normal limits  CBC  I-STAT TROPOININ, ED    Imaging Review Dg Chest 2 View  05/19/2015  CLINICAL DATA:  History of CABG procedure. Centralized chest pain for 6 hours. EXAM: CHEST  2 VIEW COMPARISON:  04/22/2015 FINDINGS: Post CABG changes  in the chest. Mild blunting at the left costophrenic angle and suspect a small left pleural effusion. This finding was present on the previous examination. No significant pulmonary edema or airspace disease. Heart size is within normal limits and stable. Negative for pneumothorax. IMPRESSION: Probable small left pleural effusion. No pulmonary edema. Electronically Signed   By: Richarda Overlie M.D.   On: 05/19/2015 19:43   I have personally reviewed and evaluated these images and lab results as part of my medical decision-making.   EKG Interpretation   Date/Time:  Monday May 19 2015 18:47:48 EST Ventricular Rate:  73 PR Interval:  146 QRS Duration: 92 QT Interval:  454 QTC Calculation: 500 R Axis:   28 Text Interpretation:  Normal sinus rhythm Prolonged QT Nonspecific T wave  abnormality Confirmed by Denton Lank  MD, Caryn Bee ([id number]) on 05/19/2015 10:53:54  PM      MDM   Final diagnoses:  NSTEMI (non-ST elevated myocardial infarction) [facility])  Hypertensive emergency    Patient seen with chest pain. Initial EKG reassuring. Troponin negative. Patient has had ongoing pain and has GI symptoms as well. Character pain is more consistent with reflux. However, given patient's complex recent cardiac history, will have cardiology evaluate. He is notably hypertensive. He was given 5 mg of hydralazine. Patient was evaluated by cardiology. They recommend additional lisinopril 10 mg as well as an additional hydralazine dose of 5 mg. This was given to the patient. If patient spoke pressures improve and his symptoms are controlled, he can be discharged with close follow-up and blood pressure monitoring.  2:55 am Patient pain-free after GI cocktail and Protonix; however, repeat troponin is 3.86 and his blood pressure continues to be largely uncontrolled blood pressure readings in the 180s over 100s. Patient was placed on a nitroglycerin drip. He was given a full dose aspirin. Heparin drip was also ordered.  Cardiology was updated and will admit the patient for further evaluation. Feel his check pain may be related to hypertensive emergency but he will likely need a cardiac cath. Patient and his wife were updated at the bedside.  I personally performed the services described in this documentation, which was scribed in my presence. The recorded information has been reviewed and is accurate.   Shon Baton, MD 05/20/15 [phone]

## 2015-05-19 NOTE — ED Notes (Signed)
Pt here for mid sternal CP and left sided pain with noted hypertension; pt had CABG 4 weeks ago

## 2015-05-20 ENCOUNTER — Encounter (HOSPITAL_COMMUNITY)
Admission: EM | Disposition: A | Discharge status: Home / Self Care | Payer: Self-pay | Source: Home / Self Care | Attending: Cardiology

## 2015-05-20 ENCOUNTER — Encounter (HOSPITAL_COMMUNITY): Payer: Self-pay | Admitting: *Deleted

## 2015-05-20 ENCOUNTER — Inpatient Hospital Stay (HOSPITAL_COMMUNITY): Payer: BLUE CROSS/BLUE SHIELD

## 2015-05-20 DIAGNOSIS — I161 Hypertensive emergency: Secondary | ICD-10-CM | POA: Diagnosis present

## 2015-05-20 DIAGNOSIS — Z885 Allergy status to narcotic agent status: Secondary | ICD-10-CM | POA: Diagnosis not present

## 2015-05-20 DIAGNOSIS — Z79899 Other long term (current) drug therapy: Secondary | ICD-10-CM | POA: Diagnosis not present

## 2015-05-20 DIAGNOSIS — I214 Non-ST elevation (NSTEMI) myocardial infarction: Secondary | ICD-10-CM | POA: Diagnosis present

## 2015-05-20 DIAGNOSIS — T82898A Other specified complication of vascular prosthetic devices, implants and grafts, initial encounter: Secondary | ICD-10-CM | POA: Diagnosis present

## 2015-05-20 DIAGNOSIS — Z87891 Personal history of nicotine dependence: Secondary | ICD-10-CM | POA: Diagnosis not present

## 2015-05-20 DIAGNOSIS — Z7982 Long term (current) use of aspirin: Secondary | ICD-10-CM | POA: Diagnosis not present

## 2015-05-20 DIAGNOSIS — F419 Anxiety disorder, unspecified: Secondary | ICD-10-CM | POA: Diagnosis present

## 2015-05-20 DIAGNOSIS — Y832 Surgical operation with anastomosis, bypass or graft as the cause of abnormal reaction of the patient, or of later complication, without mention of misadventure at the time of the procedure: Secondary | ICD-10-CM | POA: Diagnosis present

## 2015-05-20 DIAGNOSIS — I2510 Atherosclerotic heart disease of native coronary artery without angina pectoris: Secondary | ICD-10-CM | POA: Diagnosis present

## 2015-05-20 DIAGNOSIS — K219 Gastro-esophageal reflux disease without esophagitis: Secondary | ICD-10-CM | POA: Diagnosis present

## 2015-05-20 DIAGNOSIS — F329 Major depressive disorder, single episode, unspecified: Secondary | ICD-10-CM | POA: Diagnosis present

## 2015-05-20 DIAGNOSIS — Z8249 Family history of ischemic heart disease and other diseases of the circulatory system: Secondary | ICD-10-CM | POA: Diagnosis not present

## 2015-05-20 DIAGNOSIS — E785 Hyperlipidemia, unspecified: Secondary | ICD-10-CM | POA: Diagnosis present

## 2015-05-20 DIAGNOSIS — Z888 Allergy status to other drugs, medicaments and biological substances status: Secondary | ICD-10-CM | POA: Diagnosis not present

## 2015-05-20 DIAGNOSIS — R079 Chest pain, unspecified: Secondary | ICD-10-CM

## 2015-05-20 DIAGNOSIS — I220 Subsequent ST elevation (STEMI) myocardial infarction of anterior wall: Secondary | ICD-10-CM | POA: Diagnosis present

## 2015-05-20 HISTORY — PX: CARDIAC CATHETERIZATION: SHX172

## 2015-05-20 LAB — LIPID PANEL
Cholesterol: 140 mg/dL (ref 0–200)
HDL: 37 mg/dL — ABNORMAL LOW (ref >40)
LDL CALC: 81 mg/dL (ref 0–99)
TRIGLYCERIDES: 108 mg/dL (ref <150)
Total CHOL/HDL Ratio: 3.8 RATIO
VLDL: 22 mg/dL (ref 0–40)

## 2015-05-20 LAB — BASIC METABOLIC PANEL
Anion gap: 14 (ref 5–15)
BUN: 10 mg/dL (ref 6–20)
CHLORIDE: 103 mmol/L (ref 101–111)
CO2: 23 mmol/L (ref 22–32)
CREATININE: 0.81 mg/dL (ref 0.61–1.24)
Calcium: 9.2 mg/dL (ref 8.9–10.3)
GFR calc Af Amer: >60 mL/min (ref >60)
GLUCOSE: 119 mg/dL — AB (ref 65–99)
POTASSIUM: 3.3 mmol/L — AB (ref 3.5–5.1)
SODIUM: 140 mmol/L (ref 135–145)

## 2015-05-20 LAB — MRSA PCR SCREENING: MRSA BY PCR: NEGATIVE

## 2015-05-20 LAB — PROTIME-INR
INR: 1.14 (ref 0.00–1.49)
PROTHROMBIN TIME: 14.8 s (ref 11.6–15.2)

## 2015-05-20 LAB — CBC
HEMATOCRIT: 39.3 % (ref 39.0–52.0)
HEMOGLOBIN: 13 g/dL (ref 13.0–17.0)
MCH: 28 pg (ref 26.0–34.0)
MCHC: 33.1 g/dL (ref 30.0–36.0)
MCV: 84.5 fL (ref 78.0–100.0)
Platelets: 160 10*3/uL (ref 150–400)
RBC: 4.65 MIL/uL (ref 4.22–5.81)
RDW: 13.2 % (ref 11.5–15.5)
WBC: 7.1 10*3/uL (ref 4.0–10.5)

## 2015-05-20 LAB — I-STAT TROPONIN, ED: Troponin i, poc: 3.86 ng/mL (ref 0.00–0.08)

## 2015-05-20 LAB — TROPONIN I
TROPONIN I: 30.67 ng/mL — AB (ref <0.031)
TROPONIN I: 33.51 ng/mL — AB (ref <0.031)
Troponin I: 18.86 ng/mL (ref <0.031)

## 2015-05-20 LAB — HEPARIN LEVEL (UNFRACTIONATED): HEPARIN UNFRACTIONATED: 0.1 [IU]/mL — AB (ref 0.30–0.70)

## 2015-05-20 LAB — BRAIN NATRIURETIC PEPTIDE: B Natriuretic Peptide: 162.1 pg/mL — ABNORMAL HIGH (ref 0.0–100.0)

## 2015-05-20 SURGERY — LEFT HEART CATH AND CORS/GRAFTS ANGIOGRAPHY

## 2015-05-20 MED ORDER — LISINOPRIL 10 MG PO TABS
5.0000 mg | ORAL_TABLET | Freq: Once | ORAL | Status: AC
  Administered 2015-05-20: 5 mg via ORAL
  Filled 2015-05-20: qty 1

## 2015-05-20 MED ORDER — MIDAZOLAM HCL 2 MG/2ML IJ SOLN
INTRAMUSCULAR | Status: AC
  Filled 2015-05-20: qty 2

## 2015-05-20 MED ORDER — FENTANYL CITRATE (PF) 100 MCG/2ML IJ SOLN
INTRAMUSCULAR | Status: DC | PRN
  Administered 2015-05-20: 25 ug via INTRAVENOUS

## 2015-05-20 MED ORDER — ENSURE ENLIVE PO LIQD: 237.0000 mL | Freq: Two times a day (BID) | ORAL | Status: DC

## 2015-05-20 MED ORDER — ASPIRIN 81 MG PO CHEW: 324.0000 mg | CHEWABLE_TABLET | ORAL | Status: DC

## 2015-05-20 MED ORDER — NEBIVOLOL HCL 5 MG PO TABS
10.0000 mg | ORAL_TABLET | Freq: Every day | ORAL | Status: DC
  Administered 2015-05-20 – 2015-05-21 (×2): 10 mg via ORAL
  Filled 2015-05-20 (×2): qty 2

## 2015-05-20 MED ORDER — NITROGLYCERIN IN D5W 200-5 MCG/ML-% IV SOLN: 2.0000 ug/min | INTRAVENOUS | Status: DC

## 2015-05-20 MED ORDER — HEPARIN BOLUS VIA INFUSION
2000.0000 [IU] | Freq: Once | INTRAVENOUS | Status: DC
  Filled 2015-05-20: qty 2000

## 2015-05-20 MED ORDER — ALPRAZOLAM 0.25 MG PO TABS
ORAL_TABLET | ORAL | Status: AC
  Filled 2015-05-20: qty 2

## 2015-05-20 MED ORDER — SODIUM CHLORIDE 0.9 % IV SOLN: 250.0000 mL | INTRAVENOUS | Status: DC | PRN

## 2015-05-20 MED ORDER — TRAMADOL HCL 50 MG PO TABS
50.0000 mg | ORAL_TABLET | Freq: Once | ORAL | Status: AC
  Administered 2015-05-20: 50 mg via ORAL
  Filled 2015-05-20: qty 1

## 2015-05-20 MED ORDER — SODIUM CHLORIDE 0.9% FLUSH: 3.0000 mL | INTRAVENOUS | Status: DC | PRN

## 2015-05-20 MED ORDER — PANTOPRAZOLE SODIUM 40 MG PO TBEC
40.0000 mg | DELAYED_RELEASE_TABLET | Freq: Every day | ORAL | Status: DC
  Administered 2015-05-20 – 2015-05-21 (×2): 40 mg via ORAL
  Filled 2015-05-20 (×2): qty 1

## 2015-05-20 MED ORDER — PNEUMOCOCCAL VAC POLYVALENT 25 MCG/0.5ML IJ INJ
0.5000 mL | INJECTION | INTRAMUSCULAR | Status: AC
  Administered 2015-05-21: 0.5 mL via INTRAMUSCULAR
  Filled 2015-05-20: qty 0.5

## 2015-05-20 MED ORDER — ONDANSETRON HCL 4 MG/2ML IJ SOLN: 4.0000 mg | Freq: Four times a day (QID) | INTRAMUSCULAR | Status: DC | PRN

## 2015-05-20 MED ORDER — ASPIRIN 300 MG RE SUPP: 300.0000 mg | RECTAL | Status: DC

## 2015-05-20 MED ORDER — ATORVASTATIN CALCIUM 80 MG PO TABS: 80.0000 mg | ORAL_TABLET | Freq: Every day | ORAL | Status: DC

## 2015-05-20 MED ORDER — ALPRAZOLAM 0.5 MG PO TABS
0.5000 mg | ORAL_TABLET | Freq: Once | ORAL | Status: AC
  Administered 2015-05-20: 0.5 mg via ORAL
  Filled 2015-05-20: qty 1

## 2015-05-20 MED ORDER — HEPARIN (PORCINE) IN NACL 2-0.9 UNIT/ML-% IJ SOLN
INTRAMUSCULAR | Status: AC
  Filled 2015-05-20: qty 1500

## 2015-05-20 MED ORDER — FENTANYL CITRATE (PF) 100 MCG/2ML IJ SOLN
INTRAMUSCULAR | Status: AC
  Filled 2015-05-20: qty 2

## 2015-05-20 MED ORDER — AMIODARONE HCL 200 MG PO TABS
200.0000 mg | ORAL_TABLET | Freq: Every day | ORAL | Status: DC
  Administered 2015-05-20 – 2015-05-21 (×2): 200 mg via ORAL
  Filled 2015-05-20 (×2): qty 1

## 2015-05-20 MED ORDER — LISINOPRIL 10 MG PO TABS
10.0000 mg | ORAL_TABLET | Freq: Every day | ORAL | Status: DC
  Administered 2015-05-20: 10 mg via ORAL
  Filled 2015-05-20: qty 1

## 2015-05-20 MED ORDER — HYDRALAZINE HCL 20 MG/ML IJ SOLN
10.0000 mg | Freq: Once | INTRAMUSCULAR | Status: AC
  Administered 2015-05-20: 10 mg via INTRAVENOUS
  Filled 2015-05-20: qty 1

## 2015-05-20 MED ORDER — ASPIRIN EC 81 MG PO TBEC
81.0000 mg | DELAYED_RELEASE_TABLET | Freq: Every day | ORAL | Status: DC
Start: 2015-05-21 — End: 2015-05-21
  Administered 2015-05-21: 81 mg via ORAL
  Filled 2015-05-20: qty 1

## 2015-05-20 MED ORDER — IOHEXOL 350 MG/ML SOLN
INTRAVENOUS | Status: DC | PRN
  Administered 2015-05-20: 95 mL via INTRA_ARTERIAL

## 2015-05-20 MED ORDER — NITROGLYCERIN IN D5W 200-5 MCG/ML-% IV SOLN
0.0000 ug/min | Freq: Once | INTRAVENOUS | Status: AC
  Administered 2015-05-20: 5 ug/min via INTRAVENOUS
  Filled 2015-05-20: qty 250

## 2015-05-20 MED ORDER — LIDOCAINE HCL (PF) 1 % IJ SOLN
INTRAMUSCULAR | Status: AC
  Filled 2015-05-20: qty 30

## 2015-05-20 MED ORDER — ASPIRIN 81 MG PO CHEW: 81.0000 mg | CHEWABLE_TABLET | ORAL | Status: DC

## 2015-05-20 MED ORDER — SODIUM CHLORIDE 0.9% FLUSH
3.0000 mL | Freq: Two times a day (BID) | INTRAVENOUS | Status: DC
  Administered 2015-05-20: 3 mL via INTRAVENOUS

## 2015-05-20 MED ORDER — ASPIRIN 81 MG PO CHEW
324.0000 mg | CHEWABLE_TABLET | Freq: Once | ORAL | Status: AC
  Administered 2015-05-20: 324 mg via ORAL
  Filled 2015-05-20: qty 4

## 2015-05-20 MED ORDER — POTASSIUM CHLORIDE CRYS ER 20 MEQ PO TBCR
40.0000 meq | EXTENDED_RELEASE_TABLET | Freq: Once | ORAL | Status: AC
  Administered 2015-05-20: 40 meq via ORAL
  Filled 2015-05-20: qty 2

## 2015-05-20 MED ORDER — FLUOXETINE HCL 20 MG PO CAPS
30.0000 mg | ORAL_CAPSULE | Freq: Every day | ORAL | Status: DC
  Administered 2015-05-20 – 2015-05-21 (×2): 30 mg via ORAL
  Filled 2015-05-20 (×2): qty 1

## 2015-05-20 MED ORDER — GI COCKTAIL ~~LOC~~: 30.0000 mL | Freq: Three times a day (TID) | ORAL | Status: DC | PRN

## 2015-05-20 MED ORDER — NITROGLYCERIN 0.4 MG SL SUBL: 0.4000 mg | SUBLINGUAL_TABLET | SUBLINGUAL | Status: DC | PRN

## 2015-05-20 MED ORDER — ISOSORBIDE MONONITRATE ER 60 MG PO TB24
60.0000 mg | ORAL_TABLET | Freq: Every day | ORAL | Status: DC
  Administered 2015-05-20 – 2015-05-21 (×2): 60 mg via ORAL
  Filled 2015-05-20 (×2): qty 1

## 2015-05-20 MED ORDER — TAMSULOSIN HCL 0.4 MG PO CAPS
0.4000 mg | ORAL_CAPSULE | Freq: Every day | ORAL | Status: DC
  Administered 2015-05-20 – 2015-05-21 (×2): 0.4 mg via ORAL
  Filled 2015-05-20 (×2): qty 1

## 2015-05-20 MED ORDER — HEPARIN (PORCINE) IN NACL 2-0.9 UNIT/ML-% IJ SOLN
INTRAMUSCULAR | Status: DC | PRN
  Administered 2015-05-20: 1500 mL

## 2015-05-20 MED ORDER — ACETAMINOPHEN 325 MG PO TABS
650.0000 mg | ORAL_TABLET | ORAL | Status: DC | PRN
  Administered 2015-05-20 (×4): 650 mg via ORAL
  Filled 2015-05-20 (×4): qty 2

## 2015-05-20 MED ORDER — SODIUM CHLORIDE 0.9% FLUSH
3.0000 mL | Freq: Two times a day (BID) | INTRAVENOUS | Status: DC
  Administered 2015-05-21: 3 mL via INTRAVENOUS

## 2015-05-20 MED ORDER — SODIUM CHLORIDE 0.9 % IV SOLN: INTRAVENOUS | Status: DC

## 2015-05-20 MED ORDER — LISINOPRIL 20 MG PO TABS
20.0000 mg | ORAL_TABLET | Freq: Every day | ORAL | Status: DC
  Administered 2015-05-21: 20 mg via ORAL
  Filled 2015-05-20: qty 1

## 2015-05-20 MED ORDER — LIDOCAINE HCL (PF) 1 % IJ SOLN
INTRAMUSCULAR | Status: DC | PRN
  Administered 2015-05-20: 20 mL

## 2015-05-20 MED ORDER — HEPARIN (PORCINE) IN NACL 100-0.45 UNIT/ML-% IJ SOLN
1900.0000 [IU]/h | INTRAMUSCULAR | Status: DC
  Administered 2015-05-20: 1600 [IU]/h via INTRAVENOUS
  Filled 2015-05-20 (×2): qty 250

## 2015-05-20 MED ORDER — SODIUM CHLORIDE 0.9 % WEIGHT BASED INFUSION
3.0000 mL/kg/h | INTRAVENOUS | Status: AC
  Administered 2015-05-20 (×2): 3 mL/kg/h via INTRAVENOUS

## 2015-05-20 MED ORDER — LISINOPRIL 10 MG PO TABS
10.0000 mg | ORAL_TABLET | Freq: Once | ORAL | Status: AC
  Administered 2015-05-20: 10 mg via ORAL
  Filled 2015-05-20: qty 1

## 2015-05-20 MED ORDER — MIDAZOLAM HCL 2 MG/2ML IJ SOLN
INTRAMUSCULAR | Status: DC | PRN
  Administered 2015-05-20: 2 mg via INTRAVENOUS

## 2015-05-20 SURGICAL SUPPLY — 9 items
CATH INFINITI 5 FR LCB (CATHETERS) ×3 IMPLANT
CATH INFINITI 5FR MULTPACK ANG (CATHETERS) ×3 IMPLANT
KIT HEART LEFT (KITS) ×3 IMPLANT
PACK CARDIAC CATHETERIZATION (CUSTOM PROCEDURE TRAY) ×3 IMPLANT
SHEATH PINNACLE 5F 10CM (SHEATH) ×3 IMPLANT
SYR MEDRAD MARK V 150ML (SYRINGE) ×3 IMPLANT
TRANSDUCER W/STOPCOCK (MISCELLANEOUS) ×3 IMPLANT
TUBING CIL FLEX 10 FLL-RA (TUBING) ×3 IMPLANT
WIRE EMERALD 3MM-J .035X150CM (WIRE) ×3 IMPLANT

## 2015-05-20 NOTE — Progress Notes (Signed)
ANTICOAGULATION CONSULT NOTE  Pharmacy Consult for heparin Indication: chest pain/ACS  Allergies  Allergen Reactions  . Claritin [Loratadine] Other (See Comments)    Makes allergies worse-"a lot worse"  . Codeine Nausea And Vomiting  . Vicodin [Hydrocodone-Acetaminophen] Nausea And Vomiting    "pretty much any codeine"    Patient Measurements: Height:  (180.3 cm) Weight: 239 lb 14.4 oz (108.818 kg) IBW/kg (Calculated) : 75.3 Heparin Dosing Weight: 98.5 kg  Vital Signs: Temp: 98 F (36.7 C) (02/28 0941) Temp Source: Oral (02/28 0941) BP: 175/104 mmHg (02/28 0941) Pulse Rate: 76 (02/28 0405)  Labs:  Recent Labs  05/19/15 1905 05/20/15 0514 05/20/15 1040 05/20/15 1108  HGB 13.4 13.0  --   --   HCT 40.6 39.3  --   --   PLT 187 160  --   --   LABPROT  --   --   --  14.8  INR  --   --   --  1.14  HEPARINUNFRC  --   --  0.10*  --   CREATININE 0.92 0.81  --   --   TROPONINI  --  30.67* 33.51*  --     Estimated Creatinine Clearance: 135.4 mL/min (by C-G formula based on Cr of 0.81).   Medical History: Past Medical History  Diagnosis Date  . Hypertension   . Depression   . Anxiety   . Hyperlipidemia   . NSTEMI (non-ST elevated myocardial infarction) (HCC) 04/15/2015  . Lumbar herniated disc dx'd 03/2015     Assessment: 49yo male c/o mid-sternal CP s/p CABG 4wk ago, initial cards exam symptoms more likely d/t GERD, initial troponin negative. Troponin now elevated to 33.51. Initial heparin level sub-therapeutic at 0.1.  Goal of Therapy:  Heparin level 0.3-0.7 units/ml Monitor platelets by anticoagulation protocol: Yes   Plan:  Will increase heparin gtt to 1900 units/hr and check a 6 hour level to monitor.  Monitor daily heparin levels and CBC.  Thank you for allowing [place] to participate in this patients care. Signe Colt, PharmD Pager: [phone]  05/20/2015,12:10 PM

## 2015-05-20 NOTE — Progress Notes (Signed)
SUBJECTIVE:  No further chest pain since admission   PHYSICAL EXAM Filed Vitals:   05/20/15 0600 05/20/15 0620 05/20/15 0735 05/20/15 0941  BP: 163/100 162/125 181/103 175/104  Pulse:      Temp:    98 F (36.7 C)  TempSrc:    Oral  Resp:    15  Height:      Weight:      SpO2:    96%   General:  No distress Lungs:  Clear Heart:  RRR Abdomen:  Positive bowel sounds, no rebound no guarding Extremities:  No edema, status post right radial harvest  LABS: Lab Results  Component Value Date   TROPONINI 33.51* 05/20/2015   Results for orders placed or performed during the hospital encounter of 05/19/15 (from the past 24 hour(s))  Basic metabolic panel     Status: Abnormal   Collection Time: 05/19/15  7:05 PM  Result Value Ref Range   Sodium 139 135 - 145 mmol/L   Potassium 3.4 (L) 3.5 - 5.1 mmol/L   Chloride 101 101 - 111 mmol/L   CO2 25 22 - 32 mmol/L   Glucose, Bld 162 (H) 65 - 99 mg/dL   BUN 13 6 - 20 mg/dL   Creatinine, Ser 1.61 0.61 - 1.24 mg/dL   Calcium 9.3 8.9 - 09.6 mg/dL   GFR calc non Af Amer >60 >60 mL/min   GFR calc Af Amer >60 >60 mL/min   Anion gap 13 5 - 15  CBC     Status: None   Collection Time: 05/19/15  7:05 PM  Result Value Ref Range   WBC 6.6 4.0 - 10.5 K/uL   RBC 4.78 4.22 - 5.81 MIL/uL   Hemoglobin 13.4 13.0 - 17.0 g/dL   HCT 04.5 40.9 - 81.1 %   MCV 84.9 78.0 - 100.0 fL   MCH 28.0 26.0 - 34.0 pg   MCHC 33.0 30.0 - 36.0 g/dL   RDW 91.4 78.2 - 95.6 %   Platelets 187 150 - 400 K/uL  Brain natriuretic peptide     Status: Abnormal   Collection Time: 05/19/15  7:05 PM  Result Value Ref Range   B Natriuretic Peptide 162.1 (H) 0.0 - 100.0 pg/mL  I-stat troponin, ED (not at [facility])     Status: None   Collection Time: 05/19/15  7:17 PM  Result Value Ref Range   Troponin i, poc 0.02 0.00 - 0.08 ng/mL   Comment 3          I-Stat Troponin, ED (not at [facility])     Status: Abnormal   Collection Time: 05/20/15  2:34 AM  Result Value Ref Range   Troponin i, poc 3.86 (HH) 0.00 - 0.08 ng/mL   Comment NOTIFIED PHYSICIAN    Comment 3          MRSA PCR Screening     Status: None   Collection Time: 05/20/15  4:35 AM  Result Value Ref Range   MRSA by PCR NEGATIVE NEGATIVE  CBC     Status: None   Collection Time: 05/20/15  5:14 AM  Result Value Ref Range   WBC 7.1 4.0 - 10.5 K/uL   RBC 4.65 4.22 - 5.81 MIL/uL   Hemoglobin 13.0 13.0 - 17.0 g/dL   HCT 21.3 08.6 - 57.8 %   MCV 84.5 78.0 - 100.0 fL   MCH 28.0 26.0 - 34.0 pg   MCHC 33.1 30.0 - 36.0 g/dL   RDW 46.9 62.9 -  15.5 %   Platelets 160 150 - 400 K/uL  Troponin I     Status: Abnormal   Collection Time: 05/20/15  5:14 AM  Result Value Ref Range   Troponin I 30.67 (HH) <0.031 ng/mL  Lipid panel     Status: Abnormal   Collection Time: 05/20/15  5:14 AM  Result Value Ref Range   Cholesterol 140 0 - 200 mg/dL   Triglycerides 161 <096 mg/dL   HDL 37 (L) >04 mg/dL   Total CHOL/HDL Ratio 3.8 RATIO   VLDL 22 0 - 40 mg/dL   LDL Cholesterol 81 0 - 99 mg/dL  Basic metabolic panel     Status: Abnormal   Collection Time: 05/20/15  5:14 AM  Result Value Ref Range   Sodium 140 135 - 145 mmol/L   Potassium 3.3 (L) 3.5 - 5.1 mmol/L   Chloride 103 101 - 111 mmol/L   CO2 23 22 - 32 mmol/L   Glucose, Bld 119 (H) 65 - 99 mg/dL   BUN 10 6 - 20 mg/dL   Creatinine, Ser 5.40 0.61 - 1.24 mg/dL   Calcium 9.2 8.9 - 98.1 mg/dL   GFR calc non Af Amer >60 >60 mL/min   GFR calc Af Amer >60 >60 mL/min   Anion gap 14 5 - 15  Heparin level (unfractionated)     Status: Abnormal   Collection Time: 05/20/15 10:40 AM  Result Value Ref Range   Heparin Unfractionated 0.10 (L) 0.30 - 0.70 IU/mL  Troponin I     Status: Abnormal   Collection Time: 05/20/15 10:40 AM  Result Value Ref Range   Troponin I 33.51 (HH) <0.031 ng/mL  Protime-INR     Status: None   Collection Time: 05/20/15 11:08 AM  Result Value Ref Range   Prothrombin Time 14.8 11.6 - 15.2 seconds   INR 1.14 0.00 - 1.49    Intake/Output  Summary (Last 24 hours) at 05/20/15 1421 Last data filed at 05/20/15 0500  Gross per 24 hour  Intake      0 ml  Output    250 ml  Net   -250 ml    ECHO:  - Left ventricle: The cavity size was normal. Wall thickness was increased in a pattern of mild LVH. Basal inferior severe hypokinesis, inferolateral hypokinesis. Systolic function was low normal to mildly reduced. The estimated ejection fraction was in the range of 50% to 55%. Doppler parameters are consistent with abnormal left ventricular relaxation (grade 1 diastolic dysfunction). - Aortic valve: There was no stenosis. - Ascending aorta: The ascending aorta was mildly dilated at 3.7 cm. - Mitral valve: There was no significant regurgitation. - Left atrium: The atrium was mildly dilated. - Right ventricle: Poorly visualized. - Pulmonary arteries: No complete TR doppler jet so unable to estimate PA systolic pressure. - Inferior vena cava: The vessel was normal in size. The respirophasic diameter changes were in the normal range (>= 50%), consistent with normal central venous pressure. - Pericardium, extracardiac: A trivial pericardial effusion was identified.  ASSESSMENT AND PLAN:  NSTEMI:  Admitted earlier today.   Labs as above.   Cath today.  Right femoral.     Rollene Rotunda 05/20/2015 2:21 PM

## 2015-05-20 NOTE — Care Management Note (Signed)
Case Management Note  Patient Details  Name: Ebin Palazzi MRN: [id number] Date of Birth: 04-08-1965  Subjective/Objective:         Pt admitted for increased chest pain. Per MD notes: NSTEMI in setting of atypical symptoms. Plan for cardiac cath 05-20-15. Pt is from home with family support.   Action/Plan: CM will continue to monitor for additional disposition needs.    Expected Discharge Date:                  Expected Discharge Plan:  Home/Self Care  In-House Referral:     Discharge planning Services  CM Consult  Post Acute Care Choice:    Choice offered to:     DME Arranged:    DME Agency:     HH Arranged:    HH Agency:     Status of Service:  In process, will continue to follow  Medicare Important Message Given:    Date Medicare IM Given:    Medicare IM give by:    Date Additional Medicare IM Given:    Additional Medicare Important Message give by:     If discussed at Long Length of Stay Meetings, dates discussed:    Additional Comments:  Gala Lewandowsky, RN 05/20/2015, 11:56 AM

## 2015-05-20 NOTE — Progress Notes (Signed)
Troponin now 30.67.  The patient was placed on the cath schedule for 0900hrs..  NPO.  Orders written.  Replace K.      Vivianna Piccini, PAC

## 2015-05-20 NOTE — Interval H&P Note (Signed)
History and Physical Interval Note:  05/20/2015 6:21 PM  [name]  has presented today for surgery, with the diagnosis of positive trop  The various methods of treatment have been discussed with the patient and family. After consideration of risks, benefits and other options for treatment, the patient has consented to  Procedure(s): Left Heart Cath and Cors/Grafts Angiography ([name]) as a surgical intervention .  The patient's history has been reviewed, patient examined, no change in status, stable for surgery.  I have reviewed the patient's chart and labs.  Questions were answered to the patient's satisfaction.   Cath Lab Visit (complete for each Cath Lab visit)  Clinical Evaluation Leading to the Procedure:   ACS: Yes.    Non-ACS:    Anginal Classification: CCS IV  Anti-ischemic medical therapy: Maximal Therapy (2 or more classes of medications)  Non-Invasive Test Results: No non-invasive testing performed  Prior CABG: Previous CABG        Theron Arista [facility] 05/20/2015 6:21 PM

## 2015-05-20 NOTE — Progress Notes (Signed)
CRITICAL VALUE ALERT  Critical value received:  Troponin 30.67  Date of notification:  05/20/2015  Time of notification:  0640  Critical value read back:Yes.    Nurse who received alert:  B. Quintella Baton RN  MD notified (1st page):  B. Hager PA on call with Cardiology  Time of first page:  [phone]  MD notified (2nd page):  Time of second page:  Responding MD:  B. Hager PA   Time MD responded:  [phone]  Patient chest pain free.  Patient currently has IV heparin and IV nitroglycerin infusing per MD orders.  Penni Bombard PA updated with all information in this note.

## 2015-05-20 NOTE — Progress Notes (Signed)
Wife in to visit.

## 2015-05-20 NOTE — Plan of Care (Signed)
Problem: Education: Goal: Knowledge of [facility] Education information/materials will improve Outcome: Progressing Patient aware of plan of care.  RN discussed purpose of Nitroglycerin IV and heparin IV with patient.  Patient stated understanding.  Patient did complain of a 3/10 headache, tylenol administered per PRN order, headache resolved.

## 2015-05-20 NOTE — Progress Notes (Signed)
ANTICOAGULATION CONSULT NOTE - Initial Consult  Pharmacy Consult for heparin Indication: chest pain/ACS  Allergies  Allergen Reactions  . Claritin [Loratadine] Other (See Comments)    Makes allergies worse-"a lot worse"  . Codeine Nausea And Vomiting  . Vicodin [Hydrocodone-Acetaminophen] Nausea And Vomiting    "pretty much any codeine"    Patient Measurements: Height: 6' (182.9 cm) Weight: 252 lb 3.3 oz (114.4 kg) IBW/kg (Calculated) : 77.6 Heparin Dosing Weight: 105kg  Vital Signs: Temp: 98.8 F (37.1 C) (02/27 1844) Temp Source: Oral (02/27 1844) BP: 165/109 mmHg (02/28 0300) Pulse Rate: 70 (02/28 0300)  Labs:  Recent Labs  05/19/15 1905  HGB 13.4  HCT 40.6  PLT 187  CREATININE 0.92    Estimated Creatinine Clearance: 124 mL/min (by C-G formula based on Cr of 0.92).   Medical History: Past Medical History  Diagnosis Date  . Hypertension   . Depression   . Anxiety   . Hyperlipidemia   . NSTEMI (non-ST elevated myocardial infarction) (HCC) 04/15/2015  . Lumbar herniated disc dx'd 03/2015     Assessment: 49yo male c/o mid-sternal CP s/p CABG 4wk ago, cards exam shows CO more likely 2/2 GERD, initial troponin negative but now elevated to 3.86, to begin heparin.  Goal of Therapy:  Heparin level 0.3-0.7 units/ml Monitor platelets by anticoagulation protocol: Yes   Plan:  Will begin heparin gtt at 1600 units/hr based on dosing from recent admission (will avoid bolus for now 2/2 recent CABG) and monitor heparin levels and CBC.  Vernard Gambles, PharmD, BCPS  05/20/2015,3:19 AM

## 2015-05-20 NOTE — Progress Notes (Signed)
44fr sheath aspirated and removed from rfa , manual pressure applied for 20 minutes. Groin level 0, no S+S of hematoma. Bedrest instructions given. Tegaderm dressing applied.  Bedrest begins at 19:35:00 for 4 hours.

## 2015-05-20 NOTE — Progress Notes (Signed)
  Echocardiogram 2D Echocardiogram has been performed.  Cathie Beams 05/20/2015, 8:51 AM

## 2015-05-20 NOTE — Consult Note (Deleted)
Cardiology Consult    Patient ID: [name] MRN: [id number], DOB/AGE: [birth year]   Admit date: 05/19/2015 Date of Consult: 05/20/2015  Primary Physician: Jarrett Soho, PA-C Primary Cardiologist: Dr. Delton See Requesting Provider: Dr. Ross Marcus  Patient Profile    [name] is a 50 y.o. male with a history of HTN, tobacco abuse, depression/anxiety, family history of heart disease and recently diagnosed CAD S/P CABG who presents with chest pain and hypertension.   Past Medical History   Past Medical History  Diagnosis Date  . Hypertension   . Depression   . Anxiety   . Hyperlipidemia   . NSTEMI (non-ST elevated myocardial infarction) (HCC) 04/15/2015  . Lumbar herniated disc dx'd 03/2015    Past Surgical History  Procedure Laterality Date  . Cardiac catheterization  04/15/2015  . Tonsillectomy  1960s  . Back surgery    . Lumbar laminectomy  2002  . Cardiac catheterization [name] 04/15/2015    Procedure: Left Heart Cath and Coronary Angiography;  Surgeon: Runell Gess, MD;  Location: [facility] INVASIVE CV LAB;  Service: Cardiovascular;  Laterality: [name];  . Coronary artery bypass graft [name] 04/18/2015    Procedure: CORONARY ARTERY BYPASS GRAFTING times four with LIMA  to LAD, LEFT RADIAL ARTERY to OM, Right saphenous vein harvesting andutilized to Interm and to PD ENDOSCOPIC GREATER SAPHENOUS VEIN HARVEST (EVH) RIGHT THIGH;  Surgeon: Delight Ovens, MD;  Location: [facility] OR;  Service: Open Heart Surgery;  Laterality: [name];  . Tee without cardioversion [name] 04/18/2015    Procedure: TRANSESOPHAGEAL ECHOCARDIOGRAM (TEE);  Surgeon: Delight Ovens, MD;  Location[facility] OR;  Service: Open Heart Surgery;  Laterality: [name];  . Radial artery harvest Left 04/18/2015    Procedure:  LEFT Arm RADIAL ARTERY HARVEST;  Surgeon: Delight Ovens, MD;  Location: [facility] OR;  Service: Open Heart Surgery;  Laterality: Left;     Allergies  Allergies  Allergen Reactions  . Claritin [Loratadine]  Other (See Comments)    Makes allergies worse-"a lot worse"  . Codeine Nausea And Vomiting  . Vicodin [Hydrocodone-Acetaminophen] Nausea And Vomiting    "pretty much any codeine"    History of Present Illness    [name] is a 50 y.o. male with a history of HTN, tobacco abuse, depression/anxiety, family history of heart disease and recently diagnosed CAD S/P CABG who presents with chest pain and hypertension.   He was admitted 1/24-1/31/17. He presented to the ED on 04/15/2015 with complaints of chest pain and ruled in for NSTEMI. He was taken for cardiac catheterization the day of presentation which showed multivessel CAD with normal EF and it was felt CABG was indicated. He was evaluated by Dr. Tyrone Sage on 04/15/2015 at which time he was in agreement the patient would benefit from CABG procedure. He was taken to the operating room on 04/18/2015 and underwent CABG x 4 utilizing LIMA to LAD, Left radial artery to OM, SVG to Intermediate, and SVG to PD. He also underwent open harvest of left radial artery and endoscopic harvest of greater saphenous vein from right thigh.He was aggressively diuresed for hypervolemia. He maintained NSR on amiodarone. He was mildly hypertensive and started on a started on an ACE inhibitor.  He was seen in clinic on 04/29/15, he denied CP and stated he has been walking about 1 mile everyday and gets some dyspnea. BP was a elevated in clinic(166/90). No changes were made to his BP because he appeared nervous and he reported home BPs in the 130s/80s.  He was continued on lisinopril  daily, imdur  daily, and Bystolic  daily.  He presented to the ER today with chest pain. He reports that today for lunch he had some spicy salami for lunch. He developed a central chest burining with mild nausea. This was unlike his MI pain and unlike his typical GERD symptoms that he frequently takes tums for. He does not take a daily PPI or H2 boocker. Bunging is not modified by  position, movement, eating, or respirations. He was hypertensive on arrival (187/127) but was otherwise stable: R 69, RR 14, 98% on RA. ECG demonstrated NSR, NSSTTWC, prolonged QTc. Compared to 05/05/15 ECG, repolarization abnormalities are slightly less prominent and QTc is slightly longer. Labs were notable for K 3.4, Cr 0.92, BNP 162, POC TnI 0.02. CXR demonstrated small L pleural effusion but no pneumonia or edema. He was given a GI cocktail and protonix for presumed GERD and  IV hydralazine for HTN. He was subsequently given  PO lisinopril and an additional  IV hydralazine after I discussed the case with Dr. Wilkie Aye. He reports the GI cocktail helped his symptoms a bit.  He continue to do well s/p CABG, walking 1 mile per day without angina, although he occasionally gets some SOB.   Inpatient Medications    . hydrALAZINE  10 mg Intravenous Once  . lisinopril  10 mg Oral Once    Family History    Family History  Problem Relation Age of Onset  . Heart attack Father 36    Deceased at this age  . Diabetes Sister   . Cancer Father     Lung    Social History    Social History   Social History  . Marital Status: Married    Spouse Name: [name]  . Number of Children: [name]  . Years of Education: [name]   Occupational History  . Not on file.   Social History Main Topics  . Smoking status: Former Smoker -- 1.50 packs/day for 33 years    Types: Cigarettes    Quit date: 04/15/2015  . Smokeless tobacco: Never Used  . Alcohol Use: 0.0 oz/week    0 Standard drinks or equivalent per week     Comment: 04/15/2015 "stopped drinking in ~ 11/2014"  . Drug Use: Yes    Special: Marijuana, Cocaine     Comment: "in my teens"  . Sexual Activity: Not Currently   Other Topics Concern  . Not on file   Social History Narrative     Review of Systems    General:  No chills, fever, night sweats or weight changes.  Cardiovascular:  + chest burning. dyspnea on exertion, edema, orthopnea,  palpitations, paroxysmal nocturnal dyspnea. Dermatological: No rash, lesions/masses Respiratory: No cough, dyspnea Urologic: No hematuria, dysuria Abdominal:   No nausea, vomiting, diarrhea, bright red blood per rectum, melena, or hematemesis Neurologic:  No visual changes, wkns, changes in mental status. All other systems reviewed and are otherwise negative except as noted above.  Physical Exam    Blood pressure 205/116, pulse 70, temperature 98.8 F (37.1 C), temperature source Oral, resp. rate 15, SpO2 97 %.  General: Pleasant, NAD Psych: Normal affect. Neuro: Alert and oriented X 3. Moves all extremities spontaneously. HEENT: Normal  Neck: Supple without bruits or JVD. Lungs:  Resp regular and unlabored. Bibasilar crackles.  Heart: RRR no s3, s4, or murmurs. Abdomen: Soft, non-tender, non-distended, BS + x 4.  Extremities: No clubbing, cyanosis or edema. DP/PT/Radials 2+ and equal  bilaterally.  Labs    Troponin [facility] Test)  Recent Labs  05/19/15 1917  TROPIPOC 0.02   No results for input(s): CKTOTAL, CKMB, TROPONINI in the last 72 hours. Lab Results  Component Value Date   WBC 6.6 05/19/2015   HGB 13.4 05/19/2015   HCT 40.6 05/19/2015   MCV 84.9 05/19/2015   PLT 187 05/19/2015    Recent Labs Lab 05/19/15 1905  NA 139  K 3.4*  CL 101  CO2 25  BUN 13  CREATININE 0.92  CALCIUM 9.3  GLUCOSE 162*   Lab Results  Component Value Date   CHOL 243* 04/16/2015   HDL 38* 04/16/2015   LDLCALC 127* 04/16/2015   TRIG 389* 04/16/2015   No results found for: [facility]   Radiology Studies    Dg Chest 2 View  05/19/2015  CLINICAL DATA:  History of CABG procedure. Centralized chest pain for 6 hours. EXAM: CHEST  2 VIEW COMPARISON:  04/22/2015 FINDINGS: Post CABG changes in the chest. Mild blunting at the left costophrenic angle and suspect a small left pleural effusion. This finding was present on the previous examination. No significant pulmonary edema or  airspace disease. Heart size is within normal limits and stable. Negative for pneumothorax. IMPRESSION: Probable small left pleural effusion. No pulmonary edema. Electronically Signed   By: Richarda Overlie M.D.   On: 05/19/2015 19:43   Dg Chest 2 View  04/22/2015  CLINICAL DATA:  Open heart surgery . EXAM: CHEST  2 VIEW COMPARISON:  04/20/2015. FINDINGS: Interim removal right IJ sheath. Prior CABG. Cardiomegaly with normal pulmonary vascularity. Lung volumes with bibasilar atelectasis. Left lower lobe infiltrate cannot be excluded. No pleural effusion or pneumothorax. IMPRESSION: 1. Interim removal of right IJ sheath. 2. Low lung volumes with bibasilar atelectasis. Left lower lobe infiltrate cannot be excluded. 3. Prior CABG.  Stable cardiomegaly. Electronically Signed   By: Maisie Fus  Register   On: 04/22/2015 07:19   Dg Chest Port 1 View  04/20/2015  CLINICAL DATA:  Status post coronary bypass grafting EXAM: PORTABLE CHEST 1 VIEW COMPARISON:  04/19/2015 FINDINGS: Postsurgical changes are again seen. A right jugular sheath remains in place. The Swan-Ganz catheter, left thoracostomy catheter and mediastinal drain have been removed in the interval. No pneumothorax is seen. Mild left midlung atelectatic changes are seen. No other focal abnormality is noted. IMPRESSION: Left midlung atelectasis.  No pneumothorax following tube removal. Electronically Signed   By: Alcide Clever M.D.   On: 04/20/2015 07:22    ECG & Cardiac Imaging     04/15/15 Procedures    Left Heart Cath and Coronary Angiography    Conclusion     Mid Cx lesion, 95% stenosed.  Mid Cx to Dist Cx lesion, 95% stenosed.  Prox LAD to Mid LAD lesion, 50% stenosed.  Ost Ramus lesion, 60% stenosed.  Prox Cx lesion, 95% stenosed.  Prox RCA-1 lesion, 75% stenosed.  Prox RCA-2 lesion, 80% stenosed.  Mid RCA lesion, 90% stenosed.  Dist RCA lesion, 60% stenosed.  RPDA lesion, 60% stenosed.  The left ventricular systolic function  is normal.     2D ECHO: 04/18/2015 LV EF: 50 - 55% Study Conclusions - Left ventricle: Systolic function was normal. The estimated ejection fraction was in the range of 50% to 55%. - Aortic valve: No evidence of vegetation. - Mitral valve: No evidence of vegetation. - Left atrium: No evidence of thrombus in the atrial cavity or appendage. No evidence of thrombus in the appendage. - Atrial  septum: No defect or patent foramen ovale was identified. - Pulmonic valve: No evidence of vegetation.     ECG 05/19/15 @ 18:47: NSR, NSSTTWC, prolonged QTc. Compared to 05/05/15 ECG, repolarization abnormalities are slightly less prominent and QTc is slightly longer  Assessment & Plan    [name] is a 50 y.o. male with a history of HTN, tobacco abuse, depression/anxiety, family history of heart disease and recently diagnosed CAD S/P CABG who presents with chest pain and hypertension. Although the chest burning is not typical of his GERD, the presenting symptoms are most consistent with GERD. Importantly, his symptoms are very different than MI symptoms and he has had good exercise capacity without angina.  His BP control is poor, albeit improved with an additional 10mg  dose of lisinopril.   1. Increase home lisinopril from 5mg  daily to 20mg  daily.  2. Start daily PPI 3. OK to discharge from ER from CV perspective but he needs to follow-up with his PCP or Dr. Delton See within the week for a BP check and electrolyte check.   Neva Seat, MD 05/20/2015, 12:33 AM

## 2015-05-20 NOTE — H&P (Addendum)
Cardiology H&P    Patient ID: [name] MRN: [id number], DOB/AGE: 52/06/1965   Admit date: 05/19/2015 Date of Consult: 05/20/2015  Primary Physician: Jarrett Soho, PA-C Primary Cardiologist: Dr. Delton See Requesting Provider: Dr. Ross Marcus  Patient Profile    [name] is a 50 y.o. male with a history of HTN, tobacco abuse, depression/anxiety, family history of heart disease and recently diagnosed CAD S/P CABG who presents with chest pain and hypertension.   Past Medical History   Past Medical History  Diagnosis Date  . Hypertension   . Depression   . Anxiety   . Hyperlipidemia   . NSTEMI (non-ST elevated myocardial infarction) (HCC) 04/15/2015  . Lumbar herniated disc dx'd 03/2015    Past Surgical History  Procedure Laterality Date  . Cardiac catheterization  04/15/2015  . Tonsillectomy  1960s  . Back surgery    . Lumbar laminectomy  2002  . Cardiac catheterization [name] 04/15/2015    Procedure: Left Heart Cath and Coronary Angiography;  Surgeon: Runell Gess, MD;  Location: [facility] INVASIVE CV LAB;  Service: Cardiovascular;  Laterality: [name];  . Coronary artery bypass graft [name] 04/18/2015    Procedure: CORONARY ARTERY BYPASS GRAFTING times four with LIMA  to LAD, LEFT RADIAL ARTERY to OM, Right saphenous vein harvesting andutilized to Interm and to PD ENDOSCOPIC GREATER SAPHENOUS VEIN HARVEST (EVH) RIGHT THIGH;  Surgeon: Delight Ovens, MD;  Location: [facility] OR;  Service: Open Heart Surgery;  Laterality: [name];  . Tee without cardioversion [name] 04/18/2015    Procedure: TRANSESOPHAGEAL ECHOCARDIOGRAM (TEE);  Surgeon: Delight Ovens, MD;  Location: [facility] OR;  Service: Open Heart Surgery;  Laterality: [name];  . Radial artery harvest Left 04/18/2015    Procedure:  LEFT Arm RADIAL ARTERY HARVEST;  Surgeon: Delight Ovens, MD;  Location: [facility] OR;  Service: Open Heart Surgery;  Laterality: Left;     Allergies  Allergies  Allergen Reactions  . Claritin [Loratadine]  Other (See Comments)    Makes allergies worse-"a lot worse"  . Codeine Nausea And Vomiting  . Vicodin [Hydrocodone-Acetaminophen] Nausea And Vomiting    "pretty much any codeine"    History of Present Illness    [name] is a 50 y.o. male with a history of HTN, tobacco abuse, depression/anxiety, family history of heart disease and recently diagnosed CAD S/P CABG who presents with chest pain and hypertension.   He was admitted 1/24-1/31/17. He presented to the ED on 04/15/2015 with complaints of chest pain and ruled in for NSTEMI. He was taken for cardiac catheterization the day of presentation which showed multivessel CAD with normal EF and it was felt CABG was indicated. He was evaluated by Dr. Tyrone Sage on 04/15/2015 at which time he was in agreement the patient would benefit from CABG procedure. He was taken to the operating room on 04/18/2015 and underwent CABG x 4 utilizing LIMA to LAD, Left radial artery to OM, SVG to Intermediate, and SVG to PD. He also underwent open harvest of left radial artery and endoscopic harvest of greater saphenous vein from right thigh.He was aggressively diuresed for hypervolemia. He maintained NSR on amiodarone. He was mildly hypertensive and started on a started on an ACE inhibitor.  He was seen in clinic on 04/29/15, he denied CP and stated he has been walking about 1 mile everyday and gets some dyspnea. BP was a elevated in clinic(166/90). No changes were made to his BP because he appeared nervous and he reported home BPs in the 130s/80s.  He was continued on lisinopril 5mg  daily, imdur 30mg  daily, and Bystolic 10mg  daily.  He presented to the ER today with chest pain. He reports that today for lunch he had some spicy salami for lunch. He developed a central chest burining with mild nausea. This was unlike his MI pain and unlike his typical GERD symptoms that he frequently takes tums for. He does not take a daily PPI or H2 boocker. Bunging is not modified by  position, movement, eating, or respirations. He was hypertensive on arrival (187/127) but was otherwise stable: R 69, RR 14, 98% on RA. ECG demonstrated NSR, NSSTTWC, prolonged QTc. Compared to 05/05/15 ECG, repolarization abnormalities are slightly less prominent and QTc is slightly longer. Labs were notable for K 3.4, Cr 0.92, BNP 162, POC TnI 0.02. CXR demonstrated small L pleural effusion but no pneumonia or edema. He was given a GI cocktail and protonix for presumed GERD and 5mg  IV hydralazine for HTN. He was subsequently given 10mg  PO lisinopril and an additional 10mg  IV hydralazine after I discussed the case with Dr. Wilkie Aye. He reports the GI cocktail helped his symptoms a bit. A second troponin was checked and was elevated 3.86.   He continue to do well s/p CABG, walking 1 mile per day without angina, although he occasionally gets some SOB.   Inpatient Medications    . hydrALAZINE  10 mg Intravenous Once  . lisinopril  10 mg Oral Once    Family History    Family History  Problem Relation Age of Onset  . Heart attack Father 11    Deceased at this age  . Diabetes Sister   . Cancer Father     Lung    Social History    Social History   Social History  . Marital Status: Married    Spouse Name: [name]  . Number of Children: [name]  . Years of Education: [name]   Occupational History  . Not on file.   Social History Main Topics  . Smoking status: Former Smoker -- 1.50 packs/day for 33 years    Types: Cigarettes    Quit date: 04/15/2015  . Smokeless tobacco: Never Used  . Alcohol Use: 0.0 oz/week    0 Standard drinks or equivalent per week     Comment: 04/15/2015 "stopped drinking in ~ 11/2014"  . Drug Use: Yes    Special: Marijuana, Cocaine     Comment: "in my teens"  . Sexual Activity: Not Currently   Other Topics Concern  . Not on file   Social History Narrative     Review of Systems    General:  No chills, fever, night sweats or weight changes.  Cardiovascular:  +  chest burning. dyspnea on exertion, edema, orthopnea, palpitations, paroxysmal nocturnal dyspnea. Dermatological: No rash, lesions/masses Respiratory: No cough, dyspnea Urologic: No hematuria, dysuria Abdominal:   No nausea, vomiting, diarrhea, bright red blood per rectum, melena, or hematemesis Neurologic:  No visual changes, wkns, changes in mental status. All other systems reviewed and are otherwise negative except as noted above.  Physical Exam    Blood pressure 205/116, pulse 70, temperature 98.8 F (37.1 C), temperature source Oral, resp. rate 15, SpO2 97 %.  General: Pleasant, NAD Psych: Normal affect. Neuro: Alert and oriented X 3. Moves all extremities spontaneously. HEENT: Normal  Neck: Supple without bruits or JVD. Lungs:  Resp regular and unlabored. Bibasilar crackles.  Heart: RRR no s3, s4, or murmurs. Abdomen: Soft, non-tender, non-distended, BS + x 4.  Extremities: No clubbing, cyanosis or edema. DP/PT/Radials 2+ and equal bilaterally.  Labs    Troponin [facility] Test)  Recent Labs  05/19/15 1917  TROPIPOC 0.02   No results for input(s): CKTOTAL, CKMB, TROPONINI in the last 72 hours. Lab Results  Component Value Date   WBC 6.6 05/19/2015   HGB 13.4 05/19/2015   HCT 40.6 05/19/2015   MCV 84.9 05/19/2015   PLT 187 05/19/2015    Recent Labs Lab 05/19/15 1905  NA 139  K 3.4*  CL 101  CO2 25  BUN 13  CREATININE 0.92  CALCIUM 9.3  GLUCOSE 162*   Lab Results  Component Value Date   CHOL 243* 04/16/2015   HDL 38* 04/16/2015   LDLCALC 127* 04/16/2015   TRIG 389* 04/16/2015   No results found for: [facility]   Radiology Studies    Dg Chest 2 View  05/19/2015  CLINICAL DATA:  History of CABG procedure. Centralized chest pain for 6 hours. EXAM: CHEST  2 VIEW COMPARISON:  04/22/2015 FINDINGS: Post CABG changes in the chest. Mild blunting at the left costophrenic angle and suspect a small left pleural effusion. This finding was present on the previous  examination. No significant pulmonary edema or airspace disease. Heart size is within normal limits and stable. Negative for pneumothorax. IMPRESSION: Probable small left pleural effusion. No pulmonary edema. Electronically Signed   By: Richarda Overlie M.D.   On: 05/19/2015 19:43   Dg Chest 2 View  04/22/2015  CLINICAL DATA:  Open heart surgery . EXAM: CHEST  2 VIEW COMPARISON:  04/20/2015. FINDINGS: Interim removal right IJ sheath. Prior CABG. Cardiomegaly with normal pulmonary vascularity. Lung volumes with bibasilar atelectasis. Left lower lobe infiltrate cannot be excluded. No pleural effusion or pneumothorax. IMPRESSION: 1. Interim removal of right IJ sheath. 2. Low lung volumes with bibasilar atelectasis. Left lower lobe infiltrate cannot be excluded. 3. Prior CABG.  Stable cardiomegaly. Electronically Signed   By: Maisie Fus  Register   On: 04/22/2015 07:19   Dg Chest Port 1 View  04/20/2015  CLINICAL DATA:  Status post coronary bypass grafting EXAM: PORTABLE CHEST 1 VIEW COMPARISON:  04/19/2015 FINDINGS: Postsurgical changes are again seen. A right jugular sheath remains in place. The Swan-Ganz catheter, left thoracostomy catheter and mediastinal drain have been removed in the interval. No pneumothorax is seen. Mild left midlung atelectatic changes are seen. No other focal abnormality is noted. IMPRESSION: Left midlung atelectasis.  No pneumothorax following tube removal. Electronically Signed   By: Alcide Clever M.D.   On: 04/20/2015 07:22    ECG & Cardiac Imaging     04/15/15 Procedures    Left Heart Cath and Coronary Angiography    Conclusion     Mid Cx lesion, 95% stenosed.  Mid Cx to Dist Cx lesion, 95% stenosed.  Prox LAD to Mid LAD lesion, 50% stenosed.  Ost Ramus lesion, 60% stenosed.  Prox Cx lesion, 95% stenosed.  Prox RCA-1 lesion, 75% stenosed.  Prox RCA-2 lesion, 80% stenosed.  Mid RCA lesion, 90% stenosed.  Dist RCA lesion, 60% stenosed.  RPDA lesion, 60%  stenosed.  The left ventricular systolic function is normal.     2D ECHO: 04/18/2015 LV EF: 50 - 55% Study Conclusions - Left ventricle: Systolic function was normal. The estimated ejection fraction was in the range of 50% to 55%. - Aortic valve: No evidence of vegetation. - Mitral valve: No evidence of vegetation. - Left atrium: No evidence of thrombus in the atrial cavity or  appendage. No evidence of thrombus in the appendage. - Atrial septum: No defect or patent foramen ovale was identified. - Pulmonic valve: No evidence of vegetation.     ECG 05/19/15 @ 18:47: NSR, NSSTTWC, prolonged QTc. Compared to 05/05/15 ECG, repolarization abnormalities are slightly less prominent and QTc is slightly longer  Assessment & Plan    [name] is a 50 y.o. male with a history of HTN, tobacco abuse, depression/anxiety, family history of heart disease and recently diagnosed CAD S/P CABG who presents with chest pain and hypertension. Although the chest burning is not typical of his GERD, the presenting symptoms sound most consistent with GERD. His symptoms are very different than MI symptoms and he has had good exercise capacity without angina. However, his second troponin was markedly positive.   NSTEMI in setting of atypical symptoms. It is unclear if this is his new "MI equivalent" or if he indeed had GERD with a Type II MI. He will require admission for medical management of NSTEMI followed by angiography.   - UFH - cycle troponins - atorva  - bystolic 10 - lisinopril  - IV NTG gtt for BP control - ASA  - TTE - NPO for cath  GERD, poorly controlled at home - pantoprazole - PRN GI cocktail  Signed, Glori Luis, MD 05/20/2015, 12:33 AM

## 2015-05-20 NOTE — Progress Notes (Signed)
UR Completed Kurstyn Larios Graves-Bigelow, RN,BSN [phone]  

## 2015-05-20 NOTE — H&P (View-Only) (Signed)
  SUBJECTIVE:  No further chest pain since admission   PHYSICAL EXAM Filed Vitals:   05/20/15 0600 05/20/15 0620 05/20/15 0735 05/20/15 0941  BP: 163/100 162/125 181/103 175/104  Pulse:      Temp:    98 F (36.7 C)  TempSrc:    Oral  Resp:    15  Height:      Weight:      SpO2:    96%   General:  No distress Lungs:  Clear Heart:  RRR Abdomen:  Positive bowel sounds, no rebound no guarding Extremities:  No edema, status post right radial harvest  LABS: Lab Results  Component Value Date   TROPONINI 33.51* 05/20/2015   Results for orders placed or performed during the hospital encounter of 05/19/15 (from the past 24 hour(s))  Basic metabolic panel     Status: Abnormal   Collection Time: 05/19/15  7:05 PM  Result Value Ref Range   Sodium 139 135 - 145 mmol/L   Potassium 3.4 (L) 3.5 - 5.1 mmol/L   Chloride 101 101 - 111 mmol/L   CO2 25 22 - 32 mmol/L   Glucose, Bld 162 (H) 65 - 99 mg/dL   BUN 13 6 - 20 mg/dL   Creatinine, Ser 0.92 0.61 - 1.24 mg/dL   Calcium 9.3 8.9 - 10.3 mg/dL   GFR calc non Af Amer >60 >60 mL/min   GFR calc Af Amer >60 >60 mL/min   Anion gap 13 5 - 15  CBC     Status: None   Collection Time: 05/19/15  7:05 PM  Result Value Ref Range   WBC 6.6 4.0 - 10.5 K/uL   RBC 4.78 4.22 - 5.81 MIL/uL   Hemoglobin 13.4 13.0 - 17.0 g/dL   HCT 40.6 39.0 - 52.0 %   MCV 84.9 78.0 - 100.0 fL   MCH 28.0 26.0 - 34.0 pg   MCHC 33.0 30.0 - 36.0 g/dL   RDW 13.1 11.5 - 15.5 %   Platelets 187 150 - 400 K/uL  Brain natriuretic peptide     Status: Abnormal   Collection Time: 05/19/15  7:05 PM  Result Value Ref Range   B Natriuretic Peptide 162.1 (H) 0.0 - 100.0 pg/mL  I-stat troponin, ED (not at [facility], [facility])     Status: None   Collection Time: 05/19/15  7:17 PM  Result Value Ref Range   Troponin i, poc 0.02 0.00 - 0.08 ng/mL   Comment 3          I-Stat Troponin, ED (not at [facility])     Status: Abnormal   Collection Time: 05/20/15  2:34 AM  Result Value Ref Range   Troponin i, poc 3.86 (HH) 0.00 - 0.08 ng/mL   Comment NOTIFIED PHYSICIAN    Comment 3          MRSA PCR Screening     Status: None   Collection Time: 05/20/15  4:35 AM  Result Value Ref Range   MRSA by PCR NEGATIVE NEGATIVE  CBC     Status: None   Collection Time: 05/20/15  5:14 AM  Result Value Ref Range   WBC 7.1 4.0 - 10.5 K/uL   RBC 4.65 4.22 - 5.81 MIL/uL   Hemoglobin 13.0 13.0 - 17.0 g/dL   HCT 39.3 39.0 - 52.0 %   MCV 84.5 78.0 - 100.0 fL   MCH 28.0 26.0 - 34.0 pg   MCHC 33.1 30.0 - 36.0 g/dL   RDW 13.2 11.5 -   15.5 %   Platelets 160 150 - 400 K/uL  Troponin I     Status: Abnormal   Collection Time: 05/20/15  5:14 AM  Result Value Ref Range   Troponin I 30.67 (HH) <0.031 ng/mL  Lipid panel     Status: Abnormal   Collection Time: 05/20/15  5:14 AM  Result Value Ref Range   Cholesterol 140 0 - 200 mg/dL   Triglycerides 161 <096 mg/dL   HDL 37 (L) >04 mg/dL   Total CHOL/HDL Ratio 3.8 RATIO   VLDL 22 0 - 40 mg/dL   LDL Cholesterol 81 0 - 99 mg/dL  Basic metabolic panel     Status: Abnormal   Collection Time: 05/20/15  5:14 AM  Result Value Ref Range   Sodium 140 135 - 145 mmol/L   Potassium 3.3 (L) 3.5 - 5.1 mmol/L   Chloride 103 101 - 111 mmol/L   CO2 23 22 - 32 mmol/L   Glucose, Bld 119 (H) 65 - 99 mg/dL   BUN 10 6 - 20 mg/dL   Creatinine, Ser 5.40 0.61 - 1.24 mg/dL   Calcium 9.2 8.9 - 98.1 mg/dL   GFR calc non Af Amer >60 >60 mL/min   GFR calc Af Amer >60 >60 mL/min   Anion gap 14 5 - 15  Heparin level (unfractionated)     Status: Abnormal   Collection Time: 05/20/15 10:40 AM  Result Value Ref Range   Heparin Unfractionated 0.10 (L) 0.30 - 0.70 IU/mL  Troponin I     Status: Abnormal   Collection Time: 05/20/15 10:40 AM  Result Value Ref Range   Troponin I 33.51 (HH) <0.031 ng/mL  Protime-INR     Status: None   Collection Time: 05/20/15 11:08 AM  Result Value Ref Range   Prothrombin Time 14.8 11.6 - 15.2 seconds   INR 1.14 0.00 - 1.49    Intake/Output  Summary (Last 24 hours) at 05/20/15 1421 Last data filed at 05/20/15 0500  Gross per 24 hour  Intake      0 ml  Output    250 ml  Net   -250 ml    ECHO:  - Left ventricle: The cavity size was normal. Wall thickness was increased in a pattern of mild LVH. Basal inferior severe hypokinesis, inferolateral hypokinesis. Systolic function was low normal to mildly reduced. The estimated ejection fraction was in the range of 50% to 55%. Doppler parameters are consistent with abnormal left ventricular relaxation (grade 1 diastolic dysfunction). - Aortic valve: There was no stenosis. - Ascending aorta: The ascending aorta was mildly dilated at 3.7 cm. - Mitral valve: There was no significant regurgitation. - Left atrium: The atrium was mildly dilated. - Right ventricle: Poorly visualized. - Pulmonary arteries: No complete TR doppler jet so unable to estimate PA systolic pressure. - Inferior vena cava: The vessel was normal in size. The respirophasic diameter changes were in the normal range (>= 50%), consistent with normal central venous pressure. - Pericardium, extracardiac: A trivial pericardial effusion was identified.  ASSESSMENT AND PLAN:  NSTEMI:  Admitted earlier today.   Labs as above.   Cath today.  Right femoral.     Rollene Rotunda 05/20/2015 2:21 PM

## 2015-05-21 ENCOUNTER — Encounter (HOSPITAL_COMMUNITY): Payer: Self-pay | Admitting: Cardiology

## 2015-05-21 ENCOUNTER — Telehealth: Payer: Self-pay | Admitting: Cardiology

## 2015-05-21 LAB — HEMOGLOBIN A1C
Hgb A1c MFr Bld: 5.1 % (ref 4.8–5.6)
MEAN PLASMA GLUCOSE: 100 mg/dL

## 2015-05-21 MED ORDER — ASPIRIN 81 MG PO TBEC: 81.0000 mg | DELAYED_RELEASE_TABLET | Freq: Every day | ORAL | Status: DC

## 2015-05-21 MED ORDER — AMLODIPINE BESYLATE 2.5 MG PO TABS: 2.5000 mg | ORAL_TABLET | Freq: Every day | ORAL | Status: DC

## 2015-05-21 MED ORDER — NITROGLYCERIN 0.4 MG SL SUBL: 0.4000 mg | SUBLINGUAL_TABLET | SUBLINGUAL | Status: DC | PRN

## 2015-05-21 MED ORDER — CLOPIDOGREL BISULFATE 75 MG PO TABS: 75.0000 mg | ORAL_TABLET | Freq: Every day | ORAL | Status: DC

## 2015-05-21 MED ORDER — LISINOPRIL 20 MG PO TABS: 20.0000 mg | ORAL_TABLET | Freq: Every day | ORAL | Status: DC

## 2015-05-21 MED ORDER — ISOSORBIDE MONONITRATE ER 60 MG PO TB24: 60.0000 mg | ORAL_TABLET | Freq: Every day | ORAL | Status: DC

## 2015-05-21 MED ORDER — CLOPIDOGREL BISULFATE 75 MG PO TABS
75.0000 mg | ORAL_TABLET | Freq: Every day | ORAL | Status: DC
  Administered 2015-05-21: 75 mg via ORAL
  Filled 2015-05-21: qty 1

## 2015-05-21 MED ORDER — PANTOPRAZOLE SODIUM 40 MG PO TBEC: 40.0000 mg | DELAYED_RELEASE_TABLET | Freq: Every day | ORAL | Status: DC

## 2015-05-21 NOTE — Progress Notes (Signed)
Nutrition Brief Note  Patient identified on the Malnutrition Screening Tool (MST) Report.  S/P cardiac catheterization on 2/28.  Height:   Ht Readings from Last 1 Encounters:  05/20/15  (1.803 m)    Weight:   Wt Readings from Last 1 Encounters:  05/21/15 239 lb 11.2 oz (108.727 kg)   Body mass index is 33.45 kg/(m^2).  Pt meets criteria for obese based on current BMI.  Pt reports that he weighed around 252# when he was admitted for his CABG in January 2017.  States that he has began to walk daily and has successfully lost a few pounds with a usual body weight between 235 - 240.   Current diet order is heart healthy, with patient consuming 75% of meals at this time.  States that he ate pancakes and a bowl of fruit for breakfast. Patient reports that his appetite has been fair, currently and PTA.  Pt reports that he quit smoking in January 2017 and has been following a low sodium diet so foods do not taste quite as good, but he still eats regularly.  States that he has been enjoying fruits and vegetables and avoiding processed foods with his new low sodium diet.  Medications reviewed.  Labs reviewed and included: low potassium (3.3), HgbA1c (5.1), glucose (119).  Nutrition Focused Physical Exam was conducted.  Findings included no fat depletion, no muscle depletion, and no edema.  No nutrition interventions warranted at this time.  If nutrition issues arise, please consult RD.  Doroteo Glassman, Dietetic Intern Pager: [phone]

## 2015-05-21 NOTE — Progress Notes (Signed)
CARDIAC REHAB PHASE I   PRE:  Rate/Rhythm: 83 SR    BP: sitting 157/102    SaO2:   MODE:  Ambulation: 550 ft   POST:  Rate/Rhythm: 91 SR    BP: sitting 161/103     SaO2:   Tolerated well. BP still elevated. Denied CP (sts none since arrival). Ed completed. Will update CRPII (already sent referral after CABG).  [id number]   Harriet Masson CES, ACSM 05/21/2015 11:16 AM

## 2015-05-21 NOTE — Discharge Summary (Signed)
Discharge Summary    Patient ID: [name],  MRN: [id number], DOB/AGE: 1965-12-26 50 y.o.  Admit date: 05/19/2015 Discharge date: 05/21/2015  Primary Care Provider: Jarrett Soho Primary Cardiologist: Dr. Delton See   Discharge Diagnoses    Active Problems:   Essential (primary) hypertension   NSTEMI (non-ST elevated myocardial infarction) (HCC)   S/P CABG x 4   CAD- LIMA to LAD, Left radial artery to OM, SVG to Intermediate, and SVG to PD) 03/2015. LHC 04/2014 w/ occluded SVG-RI   Hypertensive emergency   Allergies Allergies  Allergen Reactions  . Claritin [Loratadine] Other (See Comments)    Makes allergies worse-"a lot worse"  . Codeine Nausea And Vomiting  . Vicodin [Hydrocodone-Acetaminophen] Nausea And Vomiting    "pretty much any codeine"    Diagnostic Studies/Procedures    LHC 05/20/15 Procedures    Left Heart Cath and Cors/Grafts Angiography    Conclusion     Mid Cx lesion, 95% stenosed.  Mid Cx to Dist Cx lesion, 95% stenosed.  Prox LAD to Mid LAD lesion, 50% stenosed.  Ost Ramus lesion, 60% stenosed.  Prox Cx lesion, 95% stenosed.  Prox RCA-1 lesion, 75% stenosed.  Prox RCA-2 lesion, 80% stenosed.  Mid RCA lesion, 90% stenosed.  Dist RCA lesion, 60% stenosed.  RPDA lesion, 60% stenosed.  SVG to the PDA was injected is very large, and is anatomically normal.  There is competitive flow.  SVG to the ramus intermediate was injected is very large.  Mid Graft to Insertion lesion, 100% stenosed.  Radial artery graft to the OM was injected is normal in caliber, and is anatomically normal.  There is competitive flow.  LIMA to the LAD was injected is normal in caliber, and is anatomically normal.  The left ventricular systolic function is normal.  1. Severe 3 vessel obstructive CAD 2. Patent LIMA to the LAD 3. Patent free radial graft to the OM. This arises from the hood of the SVG to the ramus intermediate branch 4. Occluded  mid SVG to the ramus intermediate with thrombus. 5. Patent SVG to the PDA 6. Good LV function.  Plan: It appears that the culprit lesion is occlusion of the SVG to the ramus intermediate. The native vessel does have moderate ostial stenosis but has TIMI 3 flow without other obstructive disease. It is possible that with occlusion of the SVG there may have been some thrombus showered downstream. At this point would treat medically.       2D Echo 05/20/15 Study Conclusions  - Left ventricle: The cavity size was normal. Wall thickness was increased in a pattern of mild LVH. Basal inferior severe hypokinesis, inferolateral hypokinesis. Systolic function was low normal to mildly reduced. The estimated ejection fraction was in the range of 50% to 55%. Doppler parameters are consistent with abnormal left ventricular relaxation (grade 1 diastolic dysfunction). - Aortic valve: There was no stenosis. - Ascending aorta: The ascending aorta was mildly dilated at 3.7 cm. - Mitral valve: There was no significant regurgitation. - Left atrium: The atrium was mildly dilated. - Right ventricle: Poorly visualized. - Pulmonary arteries: No complete TR doppler jet so unable to estimate PA systolic pressure. - Inferior vena cava: The vessel was normal in size. The respirophasic diameter changes were in the normal range (>= 50%), consistent with normal central venous pressure. - Pericardium, extracardiac: A trivial pericardial effusion was identified.  Impressions:  - Normal LV size with mild LV hypertrophy. Low normal to mildly reduced systolic function, EF  50-55%. Basal inferior severe hypokinesis, inferolateral mild hypokinesis. The RV was poorly visualized. No significant valvular abnormalities.    History of Present Illness     50 y/o male, followed by Dr. Delton See, with newly diagnosed CAD s/p recent CABG x 4 04/18/2015 by Dr. Tyrone Sage (LIMA to LAD, Left radial artery  to OM, SVG to Intermediate, and SVG to PD), post operative atrial fibrillation managed with amiodarone, HTN, depression and anxiety. He was discharged home 04/22/2015 by CT Surgery.   He presented back to [facility] on 05/19/15 with recurrent CP and hypertensive urgency and enzyme elevation c/w NSTEMI.   Hospital Course   Patient was admitted to telemetry. Cardiac enzymes were cycled and peaked at 33.  He was placed on IV heparin and nitro. BP improved. He underwent a LHC which demonstrated occlusion of the SVG to the ramus intermediate. The native vessel does have moderate ostial stenosis but has TIMI 3 flow without other obstructive disease. It is possible that with occlusion of the SVG there may have been some thrombus showered downstream. His remaining 3 grafts were patent (complete angiographic details outlined in cath report above). Medical management was recommended. He was continued on ASA, a statin, BB and ARB. Plavix and a long acting nitrate were added to his regimen. He was also prescribed PRN SL NTG. He did well with medical therapy. He denied any recurrent CP. No dyspnea.  He had no post cath complications. His renal function and cath sight remained stable. He had slightly elevated BPs, thus low dose amlodipine was also added to his regimen. He had no difficulties ambulating with cardiac rehab. EF was assessed by echo and was was normal at 50-55%. He was last seen and examined by Dr. Antoine Poche who determined he was stable for discharge home. He has f/u arranged with Dr. Tyrone Sage on 05/26/15. General cardiology f/u will be arranged in 1-2 weeks with Dr. Delton See or an APP.    Consultants: none    Discharge Vitals Blood pressure 151/102, pulse 77, temperature 97.7 F (36.5 C), temperature source Oral, resp. rate 22, height 5\' 11"  (1.803 m), weight 239 lb 11.2 oz (108.727 kg), SpO2 94 %.  Filed Weights   05/20/15 0300 05/20/15 0405 05/21/15 0412  Weight: 252 lb 3.3 oz (114.4 kg) 239 lb 14.4 oz (108.818  kg) 239 lb 11.2 oz (108.727 kg)    Labs & Radiologic Studies     CBC  Recent Labs  05/19/15 1905 05/20/15 0514  WBC 6.6 7.1  HGB 13.4 13.0  HCT 40.6 39.3  MCV 84.9 84.5  PLT 187 160   Basic Metabolic Panel  Recent Labs  05/19/15 1905 05/20/15 0514  NA 139 140  K 3.4* 3.3*  CL 101 103  CO2 25 23  GLUCOSE 162* 119*  BUN 13 10  CREATININE 0.92 0.81  CALCIUM 9.3 9.2   Liver Function Tests No results for input(s): AST, ALT, ALKPHOS, BILITOT, PROT, ALBUMIN in the last 72 hours. No results for input(s): LIPASE, AMYLASE in the last 72 hours. Cardiac Enzymes  Recent Labs  05/20/15 0514 05/20/15 1040 05/20/15 2030  TROPONINI 30.67* 33.51* 18.86*   BNP Invalid input(s): POCBNP D-Dimer No results for input(s): DDIMER in the last 72 hours. Hemoglobin A1C  Recent Labs  05/20/15 0514  HGBA1C 5.1   Fasting Lipid Panel  Recent Labs  05/20/15 0514  CHOL 140  HDL 37*  LDLCALC 81  TRIG 147  CHOLHDL 3.8   Thyroid Function Tests No results for input(s): TSH,  T4TOTAL, T3FREE, THYROIDAB in the last 72 hours.  Invalid input(s): FREET3  Dg Chest 2 View  05/19/2015  CLINICAL DATA:  History of CABG procedure. Centralized chest pain for 6 hours. EXAM: CHEST  2 VIEW COMPARISON:  04/22/2015 FINDINGS: Post CABG changes in the chest. Mild blunting at the left costophrenic angle and suspect a small left pleural effusion. This finding was present on the previous examination. No significant pulmonary edema or airspace disease. Heart size is within normal limits and stable. Negative for pneumothorax. IMPRESSION: Probable small left pleural effusion. No pulmonary edema. Electronically Signed   By: Richarda Overlie M.D.   On: 05/19/2015 19:43   Dg Chest 2 View  04/22/2015  CLINICAL DATA:  Open heart surgery . EXAM: CHEST  2 VIEW COMPARISON:  04/20/2015. FINDINGS: Interim removal right IJ sheath. Prior CABG. Cardiomegaly with normal pulmonary vascularity. Lung volumes with bibasilar  atelectasis. Left lower lobe infiltrate cannot be excluded. No pleural effusion or pneumothorax. IMPRESSION: 1. Interim removal of right IJ sheath. 2. Low lung volumes with bibasilar atelectasis. Left lower lobe infiltrate cannot be excluded. 3. Prior CABG.  Stable cardiomegaly. Electronically Signed   By: Maisie Fus  Register   On: 04/22/2015 07:19    Disposition   Pt is being discharged home today in good condition.  Follow-up Plans & Appointments    Follow-up Information    Follow up with Delight Ovens, MD On 05/26/2015.   Specialty:  Cardiothoracic Surgery   Why:  1:00 PM   Contact information:   [address] [employer] [address] [place] [address] [phone]       Follow up with Lars Masson, MD On 08/06/2015.   Specialty:  Cardiology   Why:  10:45   Contact information:   [address] [place] [address] [phone]       Follow up with [facility].   Why:  our office will call you with an appointment with Dr. Lindaann Slough PA in 1-2 weeks   Contact information:   [address] [place] [address] [phone]     Discharge Instructions    Diet - low sodium heart healthy    Complete by:  As directed      Increase activity slowly    Complete by:  As directed      Lifting restrictions    Complete by:  As directed   No heavy lifting for 2 weeks     Sexual Activity Restrictions    Complete by:  As directed   No sexual activity for 2 weeks           Discharge Medications   Current Discharge Medication List    START taking these medications   Details  amLODipine (NORVASC) 2.5 MG tablet Take 1 tablet (2.5 mg total) by mouth daily. Qty: 30 tablet, Refills: 5    aspirin EC 81 MG EC tablet Take 1 tablet (81 mg total) by mouth daily.    clopidogrel (PLAVIX) 75 MG tablet Take 1 tablet (75 mg total) by mouth daily. Qty: 30 tablet, Refills: 11    isosorbide mononitrate  (IMDUR) 60 MG 24 hr tablet Take 1 tablet (60 mg total) by mouth daily. Qty: 30 tablet, Refills: 5    nitroGLYCERIN (NITROSTAT) 0.4 MG SL tablet Place 1 tablet (0.4 mg total) under the tongue every 5 (five) minutes x 3 doses as needed for chest pain. Qty: 25 tablet, Refills: 2  pantoprazole (PROTONIX) 40 MG tablet Take 1 tablet (40 mg total) by mouth daily. Qty: 30 tablet, Refills: 5      CONTINUE these medications which have CHANGED   Details  lisinopril (PRINIVIL,ZESTRIL) 20 MG tablet Take 1 tablet (20 mg total) by mouth daily. Qty: 30 tablet, Refills: 5      CONTINUE these medications which have NOT CHANGED   Details  amiodarone (PACERONE) 200 MG tablet Take 200 mg by mouth daily. Refills: 1    atorvastatin (LIPITOR) 80 MG tablet Take 1 tablet (80 mg total) by mouth daily at 6 PM. Qty: 30 tablet, Refills: 3    FLUoxetine (PROZAC) 20 MG capsule Take 30 mg by mouth daily.     nebivolol (BYSTOLIC) 10 MG tablet Take 10 mg by mouth daily.    tamsulosin (FLOMAX) 0.4 MG CAPS capsule Take 0.4 mg by mouth daily. Refills: 5         Aspirin prescribed at discharge?  Yes High Intensity Statin Prescribed? (Lipitor 40-80mg  or Crestor 20-40mg ): Yes Beta Blocker Prescribed? Yes For EF 45% or less, Was ACEI/ARB Prescribed? Yes ADP Receptor Inhibitor Prescribed? (i.e. Plavix etc.-Includes Medically Managed Patients): Yes For EF <40%, Aldosterone Inhibitor Prescribed? No: EF >40% Was EF assessed during THIS hospitalization? Yes Was Cardiac Rehab II ordered? (Included Medically managed Patients): Per MD, will arrange at time of OP f/u if still stable.   Outstanding Labs/Studies   none  Duration of Discharge Encounter   Greater than 30 minutes including physician time.  Petra Kuba, BRITTAINY NP 05/21/2015, 2:30 PM   Patient seen and examined.  Plan as discussed in my rounding note for today and outlined above. Rollene Rotunda  05/21/2015  2:48 PM

## 2015-05-21 NOTE — Plan of Care (Signed)
Problem: Education: Goal: Knowledge of [facility] Education information/materials will improve Outcome: Progressing Pneumonia vaccination education given to pt, verbalized understanding of benefits and when to be immunized again.

## 2015-05-21 NOTE — Telephone Encounter (Signed)
New message     Not sure if this is a TCM.  Scheduled an 1-2wk appt with Brittainy on 05-28-15 per Brittainy.

## 2015-05-21 NOTE — Progress Notes (Signed)
RN spoke with Onalee Hua on call for Cardiology pertaining to Imdur being ordered to give dose this evening and patient returned and had orders to continue the nitroglycerin IV.  Per Onalee Hua give ordered Imdur dose and titrate Nitroglycerin IV to off as tolerated.  If systolic blood pressure elevates to 170 or greater stop decreasing rate.

## 2015-05-21 NOTE — Discharge Instructions (Signed)
Coronary Artery Disease, Male Coronary artery disease (CAD) is a process in which the blood vessels of the heart (coronary arteries) become narrow or blocked. The narrowing or blockage can lead to decreased blood flow to the heart muscle (angina). Prolonged reduced blood flow can cause a heart attack (myocardial infarction, MI). Because CAD is the leading cause of death in men, it is important to understand what causes this condition and how it is treated. CAUSES Atherosclerosis is the cause of CAD. This is the buildup of fat and cholesterol (plaque) on the inside of the arteries. Over time, the plaque may narrow or block the artery, and this will lessen blood flow to the heart. Plaque can also become weak and break off within a coronary artery to form a clot and cause a sudden blockage. RISK FACTORS Many risk factors increase your chances of getting CAD, including:  High cholesterol levels.  High blood pressure (hypertension).  Tobacco use.  Diabetes.  Age. Men over age 52 are at a greater risk of CAD.  Gender. Men often develop CAD earlier in life than women.  Family history of CAD.  Obesity.  Lack of exercise.  A diet high in saturated fats. SYMPTOMS  Many people do not experience any symptoms during the early stages of CAD. As the condition progresses, symptoms may include:   Chest pain.  The pain can be described as a crushing or squeezing in the chest, or a tightness, pressure, fullness, or heaviness in the chest.  The pain can last more than a few minutes or can stop and recur.  Pain in the arms, neck, jaw, or back.  Unexplained heartburn or indigestion.  Shortness of breath.  Nausea.  Sudden cold sweats. Less common symptoms of CAD in men can include:  Fatigue.  Unexplained feelings of nervousness or anxiety.  Weakness.  Diarrhea.  Sudden light-headedness. DIAGNOSIS  Tests to diagnose CAD may include:  ECG (electrocardiogram).  Exercise stress  test. This looks for signs of blockage when the heart is being exercised.  Pharmacologic stress test. This test looks for signs of blockage when the heart is being stressed with a medicine.  Blood tests.  Coronary angiogram. This is a procedure to look at the coronary arteries to see if there is any blockage. TREATMENT The treatment of CAD may include the following:  Healthy behavioral changes to reduce or control risk factors.  Medicine.  Coronary stenting.A stent helps to keep an artery open.  Coronary angioplasty. This procedure widens a narrowed or blocked artery.  Coronary arterybypass surgery. This will allow your blood to pass the blockage (bypass) to reach your heart. HOME CARE INSTRUCTIONS  Take medicines only as directed by your health care provider.  Do not take the following medicines unless your health care provider approves:  Nonsteroidal anti-inflammatory drugs (NSAIDs), such as ibuprofen, naproxen, or celecoxib.  Vitamin supplements that contain vitamin A, vitamin E, or both.  Manage other health conditions such as hypertension and diabetes as directed by your health care provider.  Follow a heart-healthy diet. A dietitian can help to educate you about healthy food options and changes.  Use healthy cooking methods such as roasting, grilling, broiling, baking, poaching, steaming, or stir-frying. Talk to a dietitian to learn more about healthy cooking methods.  Follow an exercise program approved by your health care provider.  Maintain a healthy weight. Lose weight as approved by your health care provider.  Plan rest periods when fatigued.  Learn to manage stress.  Do not  use any tobacco products, including cigarettes, chewing tobacco, or electronic cigarettes. If you need help quitting, ask your health care provider.  If you drink alcohol, and your health care provider approves, limit your alcohol intake to no more than 1 drink per day. One drink equals  12 ounces of beer, 5 ounces of wine, or 1 ounces of hard liquor.  Stop illegal drug use.  Your health care provider may ask you to monitor your blood pressure. A blood pressure reading consists of a higher number over a lower number, such as 110 over 72, which is written as 110/72. Ideally, your blood pressure should be:  Below 140/90 if you have no other medical conditions.  Below 130/80 if you have diabetes or kidney disease.  Keep all follow-up visits as directed by your health care provider. This is important. SEEK IMMEDIATE MEDICAL CARE IF:  You have pain in your chest, neck, arm, jaw, stomach, or back that lasts more than a few minutes, is recurring, or is unrelieved by taking medicine under your tongue (sublingualnitroglycerin).  You have profuse sweating without cause.  You have unexplained:  Heartburn or indigestion.  Shortness of breath or difficulty breathing.  Nausea or vomiting.  Fatigue.  Feelings of nervousness or anxiety.  Weakness.  Diarrhea.  You have sudden light-headedness or dizziness.  You faint. These symptoms may represent a serious problem that is an emergency. Do not wait to see if the symptoms will go away. Get medical help right away. Call your local emergency services (911 in the [place] Do not drive yourself to the hospital.   This information is not intended to replace advice given to you by your health care provider. Make sure you discuss any questions you have with your health care provider.   Document Released: 10/03/2013 Document Reviewed: 10/03/2013 Elsevier Interactive Patient Education 2016 [employer]  Heart Attack A heart attack (myocardial infarction, MI) causes damage to your heart that cannot be fixed. A heart attack can happen when a heart (coronary) artery becomes blocked or narrowed. This cuts off the blood supply and oxygen to your heart. When one or more of your coronary arteries becomes blocked, that area of your heart  begins to die. This causes the pain you feel during a heart attack. Heart attack pain can also occur in one part of the body but be felt in another part of the body (referred pain). You may feel referred heart attack pain in your left arm, neck, or jaw. Pain may even be felt in the right arm. CAUSES  Many conditions can cause a heart attack. These include:   Atherosclerosis. This is when a fatty substance (plaque) gradually builds up in the arteries. This buildup can block or reduce the blood supply to one or more of the heart arteries.  A blood clot. A blood clot can develop suddenly when plaque breaks up (ruptures) within a heart artery. A blood clot can block the heart artery, which prevents blood flow to the heart.   Severe tightening (spasm) of the heart artery. This cuts off blood flow through the artery.  RISK FACTORS People at risk for heart attack usually have one or more of the following risk factors:   High blood pressure (hypertension).  High cholesterol.  Smoking.  Being male.  Being overweight or obese.  Older aged.   A family history of heart disease.  Lack of exercise.  Diabetes.  Stress.  Drinking too much alcohol.  Using illegal street drugs, such as  cocaine and methamphetamines. SYMPTOMS  Heart attack symptoms can vary from person to person. Symptoms depend on factors like gender and age.   In both men and women, heart attack symptoms can include the following:   Chest pain. This may feel like crushing, squeezing, or a feeling of pressure.  Shortness of breath.  Heartburn or indigestion with or without vomiting, shortness of breath, or sweating.  Sudden cold sweats.  Sudden light-headedness.  Upper back pain.   Women can have unique heart attack symptoms, such as:   Unexplained feelings of nervousness or anxiety.  Discomfort between the shoulder blades or upper back.  Tingling in the hands and arms.   Older people (of both  genders) can have subtle heart attack symptoms, such as:   Sweating.  Shortness of breath.  General tiredness or not feeling well.  DIAGNOSIS  Diagnosing a heart attack involves several tests. They include:   An assessment of your vital signs. This includes checking your:  Heart rhythm.  Blood pressure.  Breathing rate.  Oxygen level.   An ECG (electrocardiogram) to measure the electrical activity of your heart.  Blood tests called cardiac markers. In these tests, blood is drawn at scheduled times to check for the specific proteins or enzymes released by damaged heart muscle.  A chest X-ray.  An echocardiogram to evaluate heart motion and blood flow.  Coronary angiography to look at the heart arteries.  TREATMENT  Treatment for a heart attack may include:   Medicine that breaks up or dissolves blood clots in the heart artery.  Angioplasty.  Cardiac stent placement.  Intra-aortic balloon pump therapy (IABP).  Open heart surgery (coronary artery bypass graft, CABG). HOME CARE INSTRUCTIONS  Take medicines only as directed by your health care provider. You may need to take medicine after a heart attack to:   Keep your blood from clotting too easily.  Control your blood pressure.  Lower your cholesterol.  Control abnormal heart rhythms.   Do not take the following medicines unless your health care provider approves:  Nonsteroidal anti-inflammatory drugs (NSAIDs), such as ibuprofen, naproxen, or celecoxib.  Vitamin supplements that contain vitamin A, vitamin E, or both.  Hormone replacement therapy that contains estrogen with or without progestin.  Make lifestyle changes as directed by your health care provider. These may include:   Quitting smoking, if you smoke.  Getting regular exercise. Ask your health care provider to suggest some activities that are safe for you.  Eating a heart-healthy diet. A registered dietitian can help you learn healthy  eating options.  Maintaining a healthy weight.   Managing diabetes, if necessary.  Reducing stress.  Limiting how much alcohol you drink. SEEK IMMEDIATE MEDICAL CARE IF:   You have sudden, unexplained chest discomfort.  You have sudden, unexplained discomfort in your arms, back, neck, or jaw.  You have shortness of breath at any time.  You suddenly start to sweat or your skin gets clammy.  You feel nauseous or vomit.  You suddenly feel light-headed or dizzy.  Your heart begins to beat fast or feels like it is skipping beats. These symptoms may represent a serious problem that is an emergency. Do not wait to see if the symptoms will go away. Get medical help right away. Call your local emergency services (911 in the [place] Do not drive yourself to the hospital.   This information is not intended to replace advice given to you by your health care provider. Make sure you discuss any questions  you have with your health care provider.   Document Released: 03/08/2005 Document Revised: 03/29/2014 Document Reviewed: 05/11/2013 Elsevier Interactive Patient Education 2016 [employer].  Sexual Activity and Heart Disease Sexual intimacy is an important part of your well-being. After a cardiac event, you may be worried about having sex. Most people can continue to have an active sex life after a cardiac event. If you or your partner have any questions about sexual activity, be sure to talk to your health care provider.  WHEN CAN YOU HAVE SEX AFTER A CARDIAC EVENT?  Resuming sexual activity (intercourse, masturbation, or oral sex) depends on the type of cardiac event you had. If you had a heart attack, it may be okay to have sex after 10 days. If you had a complicated cardiac event or heart surgery, you may not be able to have sex for as long as 8 weeks. Talk to your health care provider about safely resuming sex after your cardiac event. HOW DO YOU KNOW IF YOU ARE READY TO RETURN TO SEXUAL  ACTIVITY? Returning to sexual activity depends on your physical comfort, mental readiness, and previous sexual habits. Physically, sexual activity is no more work than climbing two flights of stairs or walking briskly for 20 minutes. If you can do these activities without having angina, having shortness of breath, or becoming overly tired, it is okay to have sex. WHAT DO I NEED TO KNOW ABOUT SEXUAL ACTIVITY AFTER A CARDIAC EVENT? After a cardiac event, some prescribed medicines may affect your sexual function. These effects can include little or no desire for sex, difficulty achieving or maintaining an erection, decreased vaginal lubrication, or the inability to achieve an orgasm. If any of these happen, do not stop taking your medicine. Talk to your health care provider about sexual dysfunction problems.   Talk to your health care provider before taking herbal medicines or vitamins. These can interfere with prescribed medicines and heart function.  If you take medicine for sexual dysfunction, avoid medicine such as nitroglycerin or long-acting nitrate medicine for 24 hours. Using a sexual dysfunction drug and a nitrate medicine together can cause a serious drop in blood pressure.  If you have angina and have taken a sexual dysfunction drug, go to your local emergency department for treatment. The emotional stress of a cardiac event can affect you and your partner's intimacy. It is important to:  Choose a relaxing atmosphere.  Talk openly and honestly with each other.  Be patient with each other.  Increase and strengthen intimacy by doing things such as caressing, touching, and holding each other. HOW DO I CARE FOR MYSELF AT HOME?  Avoid having sex after a heavy meal.  Avoid having too much alcohol before sex.  Participating in a cardiac rehabilitation program or regular exercise can benefit your sex life. It builds strength and endurance. After exercising, have sex when you feel rested and  relaxed.  Ask your partner to take a more active role during sex.  Ask your health care provider when it is okay to have anal sex.  If you have angina during sex and are not on a sexual dysfunction drug, stop having sex and take a nitroglycerin as directed. If the angina goes away, you may resume sexual activity.  Always talk to your health care provider if you are experiencing sexual dysfunction, depression, or any type of pain.   This information is not intended to replace advice given to you by your health care provider. Make sure you  discuss any questions you have with your health care provider.   Document Released: 12/27/2012 Document Revised: 03/29/2014 Document Reviewed: 12/27/2012 Elsevier Interactive Patient Education 2016 [employer] Inc[employer] Clopidogrel tablets What is this medicine? CLOPIDOGREL (kloh PID oh grel) helps to prevent blood clots. This medicine is used to prevent heart attack, stroke, or other vascular events in people who are at high risk. This medicine may be used for other purposes; ask your health care provider or pharmacist if you have questions. What should I tell my health care provider before I take this medicine? They need to know if you have any of the following conditions: -bleeding disorder -bleeding in the brain -planned surgery -stomach or intestinal ulcers -stroke or transient ischemic attack -an unusual or allergic reaction to clopidogrel, other medicines, foods, dyes, or preservatives -pregnant or trying to get pregnant -breast-feeding How should I use this medicine? Take this medicine by mouth with a drink of water. Follow the directions on the prescription label. You may take this medicine with or without food. Take your medicine at regular intervals. Do not take your medicine more often than directed. Talk to your pediatrician regarding the use of this medicine in children. Special care may be needed. Overdosage: If you think you have taken too  much of this medicine contact a poison control center or emergency room at once. NOTE: This medicine is only for you. Do not share this medicine with others. What if I miss a dose? If you miss a dose, take it as soon as you can. If it is almost time for your next dose, take only that dose. Do not take double or extra doses. What may interact with this medicine? -aspirin -blood thinners like cilostazol, enoxaparin, ticlopidine, and warfarin -certain medicines for depression like citalopram, fluoxetine, and fluvoxamine -certain medicines for fungal infections like ketoconazole, fluconazole, and voriconazole -certain medicines for HIV infection like delavirdine, efavirenz, and etravirine -certain medicines for seizures like felbamate, oxcarbazepine, and phenytoin -chloramphenicol -fluvastatin -isoniazid, INH -medicines for inflammation like ibuprofen and naproxen -modafinil -nicardipine -over-the counter supplements like echinacea, feverfew, fish oil, garlic, ginger, ginkgo, green tea, horse chestnut -quinine -stomach acid blockers like cimetidine, omeprazole, and esomeprazole -tamoxifen -tolbutamide -topiramate -torsemide This list may not describe all possible interactions. Give your health care provider a list of all the medicines, herbs, non-prescription drugs, or dietary supplements you use. Also tell them if you smoke, drink alcohol, or use illegal drugs. Some items may interact with your medicine. What should I watch for while using this medicine? Visit your doctor or health care professional for regular check ups. Do not stop taking your medicine unless your doctor tells you to. Notify your doctor or health care professional and seek emergency treatment if you develop breathing problems; changes in vision; chest pain; severe, sudden headache; pain, swelling, warmth in the leg; trouble speaking; sudden numbness or weakness of the face, arm or leg. These can be signs that your condition  has gotten worse. If you are going to have surgery or dental work, tell your doctor or health care professional that you are taking this medicine. Certain genetic factors may reduce the effect of this medicine. Your doctor may use genetic tests to determine treatment. What side effects may I notice from receiving this medicine? Side effects that you should report to your doctor or health care professional as soon as possible: -allergic reactions like skin rash, itching or hives, swelling of the face, lips, or tongue -breathing problems -changes in vision -fever -signs  and symptoms of bleeding such as bloody or black, tarry stools; red or dark-brown urine; spitting up blood or brown material that looks like coffee grounds; red spots on the skin; unusual bruising or bleeding from the eye, gums, or nose -sudden weakness -unusual bleeding or bruising Side effects that usually do not require medical attention (report to your doctor or health care professional if they continue or are bothersome): -constipation or diarrhea -headache -pain in back or joints -stomach upset This list may not describe all possible side effects. Call your doctor for medical advice about side effects. You may report side effects to FDA at 1-800-FDA-1088. Where should I keep my medicine? Keep out of the reach of children. Store at room temperature of 59 to 86 degrees F (15 to 30 degrees C). Throw away any unused medicine after the expiration date. NOTE: This sheet is a summary. It may not cover all possible information. If you have questions about this medicine, talk to your doctor, pharmacist, or health care provider.    2016, Elsevier/[employer]. (2012-07-04 16:34:37)

## 2015-05-21 NOTE — Progress Notes (Signed)
Patient Profile: 50 y.o. male with a history of HTN, tobacco abuse, depression/anxiety, family history of heart disease and recently diagnosed CAD S/P CABG x 4 04/18/2015 who presented with chest pain and hypertension. Ruled in for NSTEMI.  Subjective: Feels fine this am. No recurrent CP. Denies dyspnea. Groin is stable.   Objective: Vital signs in last 24 hours: Temp:  [98 F (36.7 C)-98.5 F (36.9 C)] 98.5 F (36.9 C) (03/01 0412) Pulse Rate:  [72-84] 77 (03/01 0638) Resp:  [12-24] 22 (03/01 0638) BP: (140-175)/(88-109) 161/99 mmHg (03/01 0638) SpO2:  [93 %-98 %] 94 % (03/01 1610) Weight:  [239 lb 11.2 oz (108.727 kg)] 239 lb 11.2 oz (108.727 kg) (03/01 0412) Last BM Date: 05/19/15  Intake/Output from previous day: 02/28 0701 - 03/01 0700 In: 1484 [P.O.:180; I.V.:1304] Out: 225 [Urine:225] Intake/Output this shift:    Medications Current Facility-Administered Medications  Medication Dose Route Frequency Provider Last Rate Last Dose  . 0.9 %  sodium chloride infusion  250 mL Intravenous PRN Peter M Swaziland, MD      . acetaminophen (TYLENOL) tablet 650 mg  650 mg Oral Q4H PRN Glori Luis, MD   650 mg at 05/20/15 2042  . amiodarone (PACERONE) tablet 200 mg  200 mg Oral Daily Glori Luis, MD   200 mg at 05/20/15 0904  . aspirin EC tablet 81 mg  81 mg Oral Daily Glori Luis, MD      . atorvastatin (LIPITOR) tablet 80 mg  80 mg Oral q1800 Glori Luis, MD      . feeding supplement (ENSURE ENLIVE) (ENSURE ENLIVE) liquid 237 mL  237 mL Oral BID BM Glori Luis, MD   237 mL at 05/20/15 0905  . FLUoxetine (PROZAC) capsule 30 mg  30 mg Oral Daily Glori Luis, MD   30 mg at 05/20/15 9604  . gi cocktail (Maalox,Lidocaine,Donnatal)  30 mL Oral TID PRN Glori Luis, MD      . isosorbide mononitrate (IMDUR) 24 hr tablet 60 mg  60 mg Oral Daily Peter M Swaziland, MD   60 mg at 05/20/15 2042  . lisinopril (PRINIVIL,ZESTRIL) tablet 20 mg  20 mg Oral Daily Peter M  [place]il[place], MD      . nebivolol (BYSTOLIC) tablet 10 mg  10 mg Oral Daily Glori Luis, MD   10 mg at 05/20/15 5409  . nitroGLYCERIN (NITROSTAT) SL tablet 0.4 mg  0.4 mg Sublingual Q5 Min x 3 PRN Glori Luis, MD      . nitroGLYCERIN 50 mg in dextrose 5 % 250 mL (0.2 mg/mL) infusion  2-200 mcg/min Intravenous Titrated Peter M Swaziland, MD   Stopped at 05/21/15 [phone]  . ondansetron (ZOFRAN) injection 4 mg  4 mg Intravenous Q6H PRN Glori Luis, MD      . pantoprazole (PROTONIX) EC tablet 40 mg  40 mg Oral Daily Glori Luis, MD   40 mg at 05/20/15 1478  . pneumococcal 23 valent vaccine (PNU-IMMUNE) injection 0.5 mL  0.5 mL Intramuscular Tomorrow-1000 Glori Luis, MD      . sodium chloride flush (NS) 0.9 % injection 3 mL  3 mL Intravenous Q12H Peter M Swazil[place], MD      . sodium chloride flush (NS) 0.9 % injection 3 mL  3 mL Intravenous PRN Peter M Swazil[place], MD      . tamsulosin [facility]) capsule 0.4 mg  0.4 mg Oral Daily Glori Luis, MD   0.4 mg at 05/20/15 2956    PE: General appearance: alert, cooperative and  no distress Neck: no carotid bruit and no JVD Lungs: clear to auscultation bilaterally Heart: regular rate and rhythm, S1, S2 normal, no murmur, click, rub or gallop Extremities: no LEE Pulses: 2+ and symmetric Skin: warm and dry Neurologic: Grossly normal  Lab Results:   Recent Labs  05/19/15 1905 05/20/15 0514  WBC 6.6 7.1  HGB 13.4 13.0  HCT 40.6 39.3  PLT 187 160   BMET  Recent Labs  05/19/15 1905 05/20/15 0514  NA 139 140  K 3.4* 3.3*  CL 101 103  CO2 25 23  GLUCOSE 162* 119*  BUN 13 10  CREATININE 0.92 0.81  CALCIUM 9.3 9.2   PT/INR  Recent Labs  05/20/15 1108  LABPROT 14.8  INR 1.14   Cholesterol  Recent Labs  05/20/15 0514  CHOL 140   Cardiac Panel (last 3 results)  Recent Labs  05/20/15 0514 05/20/15 1040 05/20/15 2030  TROPONINI 30.67* 33.51* 18.86*    Studies/Results: LHC 05/20/15 Conclusion     Mid Cx lesion, 95%  stenosed.  Mid Cx to Dist Cx lesion, 95% stenosed.  Prox LAD to Mid LAD lesion, 50% stenosed.  Ost Ramus lesion, 60% stenosed.  Prox Cx lesion, 95% stenosed.  Prox RCA-1 lesion, 75% stenosed.  Prox RCA-2 lesion, 80% stenosed.  Mid RCA lesion, 90% stenosed.  Dist RCA lesion, 60% stenosed.  RPDA lesion, 60% stenosed.  SVG to the PDA was injected is very large, and is anatomically normal.  There is competitive flow.  SVG to the ramus intermediate was injected is very large.  Mid Graft to Insertion lesion, 100% stenosed.  Radial artery graft to the OM was injected is normal in caliber, and is anatomically normal.  There is competitive flow.  LIMA to the LAD was injected is normal in caliber, and is anatomically normal.  The left ventricular systolic function is normal.  1. Severe 3 vessel obstructive CAD 2. Patent LIMA to the LAD 3. Patent free radial graft to the OM. This arises from the hood of the SVG to the ramus intermediate branch 4. Occluded mid SVG to the ramus intermediate with thrombus. 5. Patent SVG to the PDA 6. Good LV function.    2D echo 05/20/15 Study Conclusions  - Left ventricle: The cavity size was normal. Wall thickness was increased in a pattern of mild LVH. Basal inferior severe hypokinesis, inferolateral hypokinesis. Systolic function was low normal to mildly reduced. The estimated ejection fraction was in the range of 50% to 55%. Doppler parameters are consistent with abnormal left ventricular relaxation (grade 1 diastolic dysfunction). - Aortic valve: There was no stenosis. - Ascending aorta: The ascending aorta was mildly dilated at 3.7 cm. - Mitral valve: There was no significant regurgitation. - Left atrium: The atrium was mildly dilated. - Right ventricle: Poorly visualized. - Pulmonary arteries: No complete TR doppler jet so unable to estimate PA systolic pressure. - Inferior vena cava: The vessel was normal in  size. The respirophasic diameter changes were in the normal range (>= 50%), consistent with normal central venous pressure. - Pericardium, extracardiac: A trivial pericardial effusion was identified.  Impressions:  - Normal LV size with mild LV hypertrophy. Low normal to mildly reduced systolic function, EF 50-55%. Basal inferior severe hypokinesis, inferolateral mild hypokinesis. The RV was poorly visualized. No significant valvular abnormalities.   Assessment/Plan  Active Problems:   NSTEMI (non-ST elevated myocardial infarction) (HCC)   Hypertensive emergency   1. CAD/NSTEMI: troponin level peaked at 33 and is down-trending. LHC  data outlined above.  It appears that the culprit lesion is occlusion of the SVG to the ramus intermediate. The native vessel does have moderate ostial stenosis but has TIMI 3 flow without other obstructive disease. It is possible that with occlusion of the SVG there may have been some thrombus showered downstream. Medical management recommended. He denies any recurrent CP. Continue ASA, high dose statin, BB and ARB and LA nitrate. EF normal at 50-55%.   2. S/p Cath:  Groin is stable. BMP pending.   3. HTN: elevated in the 160s-180s systolic overnight. Lisinopril has been increased to 20 mg. Continue Bystolic 10 mg. Can continue to adjust in outpatient clinic.   4. Post-op Atrial Fibrillation: occurred post CABG 03/2015. On Amiodarone, 200 mg daily. NSR.    5. HLD: significant improvement in LDL from 127 mg/dL 1 month ago to 81 mg/dL now. However he is still not at goal of <70 mg/dL. Continue high dose Lipitor. Consider adding Zetia for further LDL reduction.     LOS: 1 day    Brittainy M. Delmer Islam 05/21/2015 7:47 AM  History and all data above reviewed.  Patient examined.  I agree with the findings as above. Feels OK.  Wants to go home.   The patient exam reveals COR:RRR  ,  Lungs: Clear  ,  Abd: Positive bowel sounds, no rebound no  guarding, Ext No edema, right femoral OK  .  All available labs, radiology testing, previous records reviewed. Agree with documented assessment and plan. NSTEMI:  OK to go home.  He has follow up with Dr Tyrone Sage in 8 days.  Can have cardiac rehab referral confirmed at that time.  I will start Plavix.  He needs SLNTG.   HTN:  Add Norvasc 2.5 mg daily at discharge.   Fayrene Fearing Praneeth Bussey  12:31 PM  05/21/2015

## 2015-05-22 ENCOUNTER — Encounter: Payer: Self-pay | Admitting: Cardiology

## 2015-05-22 NOTE — Telephone Encounter (Signed)
lmtcb

## 2015-05-22 NOTE — Telephone Encounter (Signed)
Call Documentation      Laurence Ferrari at 05/22/2015 11:44 AM     Status: Signed       Expand All Collapse All   Returning call from this morning.

## 2015-05-22 NOTE — Telephone Encounter (Signed)
Returning call from this morning! °

## 2015-05-22 NOTE — Telephone Encounter (Signed)
Patient contacted regarding discharge from [facility] on 05/21/2015.  Patient understands to follow up with provider on 05/28/15 at [facility] office at 8:00 a.m.Marland Kitchen  Patient understands discharge instructions?  Yes  Patient understands medications and regiment?  Yes  Patient understands to bring all medications to this visit?  Yes  Other details:

## 2015-05-22 NOTE — Telephone Encounter (Signed)
This encounter was created in error - please disregard.

## 2015-05-26 ENCOUNTER — Other Ambulatory Visit: Payer: Self-pay | Admitting: Cardiothoracic Surgery

## 2015-05-26 ENCOUNTER — Ambulatory Visit: Payer: Self-pay

## 2015-05-26 DIAGNOSIS — Z951 Presence of aortocoronary bypass graft: Secondary | ICD-10-CM

## 2015-05-27 ENCOUNTER — Ambulatory Visit
Admission: RE | Admit: 2015-05-27 | Discharge: 2015-05-27 | Disposition: A | Discharge status: Home / Self Care | Payer: BLUE CROSS/BLUE SHIELD | Source: Ambulatory Visit | Attending: Cardiothoracic Surgery | Admitting: Cardiothoracic Surgery

## 2015-05-27 ENCOUNTER — Ambulatory Visit (INDEPENDENT_AMBULATORY_CARE_PROVIDER_SITE_OTHER): Payer: Self-pay | Admitting: Cardiothoracic Surgery

## 2015-05-27 ENCOUNTER — Encounter: Payer: Self-pay | Admitting: Cardiothoracic Surgery

## 2015-05-27 VITALS — BP 140/96 | HR 69 | Resp 20 | Ht 71.0 in

## 2015-05-27 DIAGNOSIS — Z951 Presence of aortocoronary bypass graft: Secondary | ICD-10-CM

## 2015-05-27 MED ORDER — TRAMADOL HCL 50 MG PO TABS: 50.0000 mg | ORAL_TABLET | Freq: Four times a day (QID) | ORAL | Status: DC | PRN

## 2015-05-27 NOTE — Progress Notes (Signed)
[address]       [place] [address]             [phone]      [facility] Medical Record #[id number] Date of Birth: 04/30/1965  Referring: Lars Masson, MD Primary Care: Jarrett Soho, PA-C  Chief Complaint:   POST OP FOLLOW UP 04/18/2015 OPERATIVE REPORT PREOPERATIVE DIAGNOSIS: Coronary occlusive disease with recent non-ST- elevation myocardial infarction. POSTOPERATIVE DIAGNOSIS: Coronary occlusive disease with recent non-ST- elevation myocardial infarction. SURGICAL PROCEDURE: Coronary artery bypass grafting x4 with left internal mammary to the left anterior descending coronary artery. Left radial coronary artery grafted to the circumflex coronary artery. Reverse saphenous vein graft to the intermediate coronary artery. Reverse saphenous vein graft to the posterior descending coronary artery with right greater saphenous vein harvesting endoscopically from the right thigh and open harvesting of the left radial artery. SURGEON: Sheliah Plane, MD.  History of Present Illness:     Patient returns to the office today in follow-up after coronary artery bypass grafting at any recent non-ST elevation myocardial infarction, surgery was done on January 27. Patient had coronary bypass grafting 4 with 2 arterial grafts he subsequently returned on February 28 with chest pain and repeat cardiac catheterization showed clot in a vein graft to the intermediate with significant competitive flow, no intervention was performed and was decided to continue to treat the patient medically. The arterial grafts all had good flow.  The patient is improving well, is stop smoking and was proud of the fact that his cholesterol had dropped in half since it was last checked.  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 He has a history of chronic back pain and is planning to apply for  disability.  From the aspect of his sternal incision he is unable to do any heavy work for at least 3 months.     Past Medical History  Diagnosis Date  . Hypertension   . Depression   . Anxiety   . Hyperlipidemia   . NSTEMI (non-ST elevated myocardial infarction) (HCC) 04/15/2015  . Lumbar herniated disc dx'd 03/2015     History  Smoking status  . Former Smoker -- 1.50 packs/day for 33 years  . Types: Cigarettes  . Quit date: 04/15/2015  Smokeless tobacco  . Never Used    History  Alcohol Use  . 0.0 oz/week  . 0 Standard drinks or equivalent per week    Comment: 04/15/2015 "stopped drinking in ~ 11/2014"     Allergies  Allergen Reactions  . Claritin [Loratadine] Other (See Comments)    Makes allergies worse-"a lot worse"  . Codeine Nausea And Vomiting  . Vicodin [Hydrocodone-Acetaminophen] Nausea And Vomiting    "pretty much any codeine"    Current Outpatient Prescriptions  Medication Sig Dispense Refill  . amiodarone (PACERONE) 200 MG tablet Take 200 mg by mouth daily.  1  . amLODipine (NORVASC) 2.5 MG tablet Take 1 tablet (2.5 mg total) by mouth daily. 30 tablet 5  . aspirin EC 81 MG EC tablet Take 1 tablet (81 mg total) by mouth daily.    Marland Kitchen atorvastatin (LIPITOR) 80 MG tablet Take 1 tablet (80 mg total) by mouth daily at 6 PM. 30 tablet 3  . clopidogrel (PLAVIX) 75 MG tablet Take 1 tablet (75 mg total) by mouth daily. 30 tablet 11  . FLUoxetine (PROZAC) 20 MG capsule Take 30 mg by  mouth daily.     . isosorbide mononitrate (IMDUR) 60 MG 24 hr tablet Take 1 tablet (60 mg total) by mouth daily. 30 tablet 5  . lisinopril (PRINIVIL,ZESTRIL) 20 MG tablet Take 1 tablet (20 mg total) by mouth daily. 30 tablet 5  . nebivolol (BYSTOLIC) 10 MG tablet Take 10 mg by mouth daily.    . nitroGLYCERIN (NITROSTAT) 0.4 MG SL tablet Place 1 tablet (0.4 mg total) under the tongue every 5 (five) minutes x 3 doses as needed for chest pain. 25 tablet 2  . pantoprazole (PROTONIX) 40 MG  tablet Take 1 tablet (40 mg total) by mouth daily. 30 tablet 5  . tamsulosin (FLOMAX) 0.4 MG CAPS capsule Take 0.4 mg by mouth daily.  5  . traMADol (ULTRAM) 50 MG tablet Take 1 tablet (50 mg total) by mouth every 6 (six) hours as needed. 40 tablet 0   No current facility-administered medications for this visit.    Left Heart Cath and Cors/Grafts Angiography    Conclusion     Mid Cx lesion, 95% stenosed.  Mid Cx to Dist Cx lesion, 95% stenosed.  Prox LAD to Mid LAD lesion, 50% stenosed.  Ost Ramus lesion, 60% stenosed.  Prox Cx lesion, 95% stenosed.  Prox RCA-1 lesion, 75% stenosed.  Prox RCA-2 lesion, 80% stenosed.  Mid RCA lesion, 90% stenosed.  Dist RCA lesion, 60% stenosed.  RPDA lesion, 60% stenosed.  SVG to the PDA was injected is very large, and is anatomically normal.  There is competitive flow.  SVG to the ramus intermediate was injected is very large.  Mid Graft to Insertion lesion, 100% stenosed.  Radial artery graft to the OM was injected is normal in caliber, and is anatomically normal.  There is competitive flow.  LIMA to the LAD was injected is normal in caliber, and is anatomically normal.  The left ventricular systolic function is normal.  1. Severe 3 vessel obstructive CAD 2. Patent LIMA to the LAD 3. Patent free radial graft to the OM. This arises from the hood of the SVG to the ramus intermediate branch 4. Occluded mid SVG to the ramus intermediate with thrombus. 5. Patent SVG to the PDA 6. Good LV function.  Plan: It appears that the culprit lesion is occlusion of the SVG to the ramus intermediate. The native vessel does have moderate ostial stenosis but has TIMI 3 flow without other obstructive disease. It is possible that with occlusion of the SVG there may have been some thrombus showered downstream. At this point would treat medically      Physical Exam: BP 140/96 mmHg  Pulse 69  Resp 20  Ht 5\' 11"  (1.803 m)  SpO2  97%  General appearance: alert, cooperative and appears stated age Neurologic: intact Heart: regular rate and rhythm, S1, S2 normal, no murmur, click, rub or gallop Lungs: clear to auscultation bilaterally Abdomen: soft, non-tender; bowel sounds normal; no masses,  no organomegaly Extremities: extremities normal, atraumatic, no cyanosis or edema and Homans sign is negative, no sign of DVT Wound: sternum well healed , leg sites and arm well healed    Diagnostic Studies & Laboratory data:     Recent Radiology Findings:   Dg Chest 2 View  05/27/2015  CLINICAL DATA:  Status post CABG April 18, 2015, follow-up study, no chest complaints, former smoker. EXAM: CHEST  2 VIEW COMPARISON:  PA and lateral chest x-ray of May 19, 2015 FINDINGS: The lungs are adequately inflated. There is persistent increased density in  the retrocardiac region on the left consistent with pleural thickening and/or residual atelectasis. The lung markings are coarse in the perihilar regions but stable. The heart is mildly enlarged but also stable. The pulmonary vascularity is normal. The sternal wires are intact. IMPRESSION: Postsurgical pleural thickening, localized pleural effusion, or atelectasis in the retrocardiac region on the left little changed from the previous study. There is stable mild cardiomegaly without pulmonary edema. Electronically Signed   By: David  Swaziland M.D.   On: 05/27/2015 13:52      Recent Lab Findings: Lab Results  Component Value Date   WBC 7.1 05/20/2015   HGB 13.0 05/20/2015   HCT 39.3 05/20/2015   PLT 160 05/20/2015   GLUCOSE 119* 05/20/2015   CHOL 140 05/20/2015   TRIG 108 05/20/2015   HDL 37* 05/20/2015   LDLCALC 81 05/20/2015   ALT 25 04/17/2015   AST 24 04/17/2015   NA 140 05/20/2015   K 3.3* 05/20/2015   CL 103 05/20/2015   CREATININE 0.81 05/20/2015   BUN 10 05/20/2015   CO2 23 05/20/2015   TSH 2.112 04/19/2015   INR 1.14 05/20/2015   HGBA1C 5.1 05/20/2015       Assessment / Plan:      Patient stable after recent coronary artery bypass grafting and subsequent repeat cardiac catheterization after surgery. Have allowed the patient to return to driving his automobile, he is limited to lifting objects under 20 pounds or climbing ladders for at least 3 months and may be more limited by his chronic back problems long-term. Plan to see him back in 5-6 weeks       Delight Ovens MD      [address] [place] [address] Office [phone]   Beeper [phone]  05/27/2015 2:26 PM

## 2015-05-28 ENCOUNTER — Ambulatory Visit (INDEPENDENT_AMBULATORY_CARE_PROVIDER_SITE_OTHER): Payer: BLUE CROSS/BLUE SHIELD | Admitting: Cardiology

## 2015-05-28 ENCOUNTER — Encounter: Payer: Self-pay | Admitting: Cardiology

## 2015-05-28 VITALS — BP 142/100 | HR 76 | Ht 71.0 in | Wt 239.8 lb

## 2015-05-28 DIAGNOSIS — I25810 Atherosclerosis of coronary artery bypass graft(s) without angina pectoris: Secondary | ICD-10-CM | POA: Diagnosis not present

## 2015-05-28 MED ORDER — LISINOPRIL 40 MG PO TABS: 40.0000 mg | ORAL_TABLET | Freq: Every day | ORAL | Status: DC

## 2015-05-28 NOTE — Patient Instructions (Signed)
Medication Instructions:  Your physician has recommended you make the following change in your medication:  INCREASE Lisinopril to 40mg  daily   Labwork: None ordered  Testing/Procedures: None ordered  Follow-Up: Follow up as planned with Dr.[name] in May 2017  [facility]   Any Other Special Instructions Will Be Listed Below (If Applicable).     If you need a refill on your cardiac medications before your next appointment, please call your pharmacy.

## 2015-05-28 NOTE — Progress Notes (Signed)
05/28/2015 [name]   Jan 03, 1966  [id number]  Primary Physician Jarrett Soho, PA-C Primary Cardiologist: Dr. Delton See   Reason for Visit/CC: [facility] F/u for NSTEMI  HPI:  50 y/o male, followed by Dr. Delton See, with newly diagnosed CAD s/p recent CABG x 4 04/18/2015 by Dr. Tyrone Sage (LIMA to LAD, Left radial artery to OM, SVG to Intermediate, and SVG to PD), post operative atrial fibrillation managed with amiodarone, HTN, depression and anxiety. He was discharged home 04/22/2015 by CT Surgery.   He presented back to [facility] on 05/19/15 with recurrent CP and hypertensive urgency and enzyme elevation c/w NSTEMI. Patient was admitted to telemetry. Cardiac enzymes were cycled and peaked at 33. He was placed on IV heparin and nitro. BP improved. He underwent a LHC which demonstrated occlusion of the SVG to the ramus intermediate. The native vessel does have moderate ostial stenosis but has TIMI 3 flow without other obstructive disease. It is possible that with occlusion of the SVG there may have been some thrombus showered downstream. His remaining 3 grafts were patent. Medical management was recommended. He was continued on ASA, a statin, BB and ARB. Plavix and a long acting nitrate were added to his regimen. He was also prescribed PRN SL NTG. He did well with medical therapy. He denied any recurrent CP. No dyspnea. He had no post cath complications. His renal function and cath sight remained stable. He had slightly elevated BPs, thus low dose amlodipine was also added to his regimen. He had no difficulties ambulating with cardiac rehab. EF was assessed by echo and was was normal at 50-55%.  He presents to clinic today with his wife. He reports that he has done well. He denies any recurrent CP. No dyspnea, palpitations, dizziness, syncope/ nears syncope. No complications with his cath site. He notes full medication compliance. No abnormal bleeding with DAPT. He also reports that he quit smoking.  He was seen by Dr. Tyrone Sage yesterday and cleared for driving. Still with lifting restrictions.     Current Outpatient Prescriptions  Medication Sig Dispense Refill  . amiodarone (PACERONE) 200 MG tablet Take 200 mg by mouth daily.  1  . amLODipine (NORVASC) 2.5 MG tablet Take 1 tablet (2.5 mg total) by mouth daily. 30 tablet 5  . aspirin EC 81 MG EC tablet Take 1 tablet (81 mg total) by mouth daily.    Marland Kitchen atorvastatin (LIPITOR) 80 MG tablet Take 1 tablet (80 mg total) by mouth daily at 6 PM. 30 tablet 3  . clopidogrel (PLAVIX) 75 MG tablet Take 1 tablet (75 mg total) by mouth daily. 30 tablet 11  . FLUoxetine (PROZAC) 20 MG capsule Take 30 mg by mouth daily.     . isosorbide mononitrate (IMDUR) 60 MG 24 hr tablet Take 1 tablet (60 mg total) by mouth daily. 30 tablet 5  . lisinopril (PRINIVIL,ZESTRIL) 20 MG tablet Take 1 tablet (20 mg total) by mouth daily. 30 tablet 5  . nebivolol (BYSTOLIC) 10 MG tablet Take 10 mg by mouth daily.    . nitroGLYCERIN (NITROSTAT) 0.4 MG SL tablet Place 1 tablet (0.4 mg total) under the tongue every 5 (five) minutes x 3 doses as needed for chest pain. 25 tablet 2  . pantoprazole (PROTONIX) 40 MG tablet Take 1 tablet (40 mg total) by mouth daily. 30 tablet 5  . tamsulosin (FLOMAX) 0.4 MG CAPS capsule Take 0.4 mg by mouth daily.  5  . traMADol (ULTRAM) 50 MG tablet Take 1 tablet (50 mg  total) by mouth every 6 (six) hours as needed. 40 tablet 0   No current facility-administered medications for this visit.    Allergies  Allergen Reactions  . Claritin [Loratadine] Other (See Comments)    Makes allergies worse-"a lot worse"  . Codeine Nausea And Vomiting  . Vicodin [Hydrocodone-Acetaminophen] Nausea And Vomiting    "pretty much any codeine"    Social History   Social History  . Marital Status: Married    Spouse Name: [name]  . Number of Children: [name]  . Years of Education: [name]   Occupational History  . Not on file.   Social History Main Topics  .  Smoking status: Former Smoker -- 1.50 packs/day for 33 years    Types: Cigarettes    Quit date: 04/15/2015  . Smokeless tobacco: Never Used  . Alcohol Use: 0.0 oz/week    0 Standard drinks or equivalent per week     Comment: 04/15/2015 "stopped drinking in ~ 11/2014"  . Drug Use: Yes    Special: Marijuana, Cocaine     Comment: "in my teens"  . Sexual Activity: Not on file   Other Topics Concern  . Not on file   Social History Narrative     Review of Systems: General: negative for chills, fever, night sweats or weight changes.  Cardiovascular: negative for chest pain, dyspnea on exertion, edema, orthopnea, palpitations, paroxysmal nocturnal dyspnea or shortness of breath Dermatological: negative for rash Respiratory: negative for cough or wheezing Urologic: negative for hematuria Abdominal: negative for nausea, vomiting, diarrhea, bright red blood per rectum, melena, or hematemesis Neurologic: negative for visual changes, syncope, or dizziness All other systems reviewed and are otherwise negative except as noted above.    Blood pressure 142/100, pulse 76, height  (1.803 m), weight 239 lb 12.8 oz (108.773 kg).  General appearance: alert, cooperative and no distress Neck: no carotid bruit and no JVD Lungs: clear to auscultation bilaterally Heart: regular rate and rhythm, S1, S2 normal, no murmur, click, rub or gallop Extremities: no LEE Pulses: 2+ and symmetric Skin: warm and dry Neurologic: Grossly normal  EKG not performed  ASSESSMENT AND PLAN:   1. CAD: s/p NSTEMI in 03/2014 treated with CABG x 4. He had a second NSTEMI 04/2014 with cath revealing a failed SVG to RI. His other 3 grafts were patent. EF normal. Medical therapy elected. ASA, Plavix, Imdur, Lipitor, Bystolic, Lisinopril and Amlodipine. He has done well w/o recurrent angina. Continue current plan of care. Will refer to phase 2 cardiac rehab.  2. Post operative atrial fibrillation: occurred post CABG. He had  no documented recurrence during his 2nd admission. Patient denies any breakthrough symptoms of atrial fibrillation. Physical exam reveals RRR. Continue low dose amiodarone. He has a 2nd f/u with Dr. Tyrone Sage next month. If no additional afib, may consider discontinuing to avoid longterm use side effects.   3. HTN: BP is moderately elevated. He has been monitoring BP at home and diastolic pressures have been in the 90s-low 100s. BP today is 142/100. He reports full medication compliance. We will increase his lisinopril to 40 mg daily. He has been instructed to continue to monitor his blood pressure closely at home. He is to report back to [place] if he develops any hypotension.  4. Hyperlipidemia: Four-week follow-up lipid panel showed significant reduction in LDL down from 127-81 mg/dL. He has tolerated statin therapy well w/o side effects. He is to continue Lipitor 80 mg. Recheck fasting lipid panel whenever he follows up with  Dr. Delton SeeNelson in 8 weeks. LDL goal is less than 70 mg/dL. If still not at goal consider adding Zetia versus PCSK9 inhibitor.  5. Tobacco Abuse: patient reports that he quit smoking completely. He was congratulated on his efforts and instructed to continue to refrain from further use.    PLAN  F/u with Dr. Delton SeeNelson in 6-8 weeks.   Robbie LisSIMMONS, Janaki Exley PA-C 05/28/2015 7:57 AM

## 2015-05-29 ENCOUNTER — Ambulatory Visit: Payer: BLUE CROSS/BLUE SHIELD | Admitting: Cardiothoracic Surgery

## 2015-05-29 ENCOUNTER — Telehealth: Payer: Self-pay

## 2015-05-29 NOTE — Telephone Encounter (Signed)
[facility] cardiac rehab ref form signed by Dr.[name]. Placed in MR nurse fax box to be faxed

## 2015-06-10 ENCOUNTER — Telehealth (HOSPITAL_COMMUNITY): Payer: Self-pay | Admitting: *Deleted

## 2015-06-17 ENCOUNTER — Ambulatory Visit (HOSPITAL_COMMUNITY): Payer: BLUE CROSS/BLUE SHIELD

## 2015-06-19 DIAGNOSIS — Z736 Limitation of activities due to disability: Secondary | ICD-10-CM

## 2015-07-02 ENCOUNTER — Other Ambulatory Visit: Payer: Self-pay | Admitting: Cardiology

## 2015-07-02 ENCOUNTER — Other Ambulatory Visit: Payer: Self-pay | Admitting: Physician Assistant

## 2015-07-07 ENCOUNTER — Telehealth: Payer: Self-pay | Admitting: Cardiology

## 2015-07-07 NOTE — Telephone Encounter (Signed)
Notified the pt and wife that I spoke with Margaretmary DysMegan Supple our PharmD, and per Aundra MilletMegan, she recommends the pt take his amiodarone and lisinopril at night, after he eats food.  Informed the pt and wife that per [facility], he should only be taking Tramadol every 6 hours as needed for pain, and most definitely take this with food.  Informed the pt that he should eat breakfast and wait 30 minutes, then take his morning medication regimen then. Informed the pt and wife that per [facility], taking all these meds on an empty stomach, will cause his complaints mentioned.  Informed the pt that per Megan, the increase of lisinopril from 20-40 mg, should not cause him any stomach issues, however if its on an empty stomach, this could irritation to that area. Advised the pt that if his symptoms continue to bother him, even after instructions provided, then he should notify our office of this.  Informed the pt that I will route this message to Dr Delton SeeNelson for further review and recommendation if any, and follow-up thereafter.  Advised the pt to make sure he's staying plenty hydrated with water.  Pt verbalized understanding and agrees with this plan. Pt gracious for all the assistance provided.

## 2015-07-07 NOTE — Telephone Encounter (Signed)
Pt and wife is calling in regards to the pt taking his medications and making him feel light-headed, with N/V x 2 weeks.  Pt and wife state that the pt had his lisinopril increased from 20 to 40 mg po daily, to decrease his BP.  Pt and wife state that he was also started on amiodarone 200 mg po daily as well.  Wife and pt state that the pt has also been taking Tramadol, at least 2 times a day, as needed for pain.  Wife and pt state that since these 3 meds were started, the pt has been having episodes of lightheadedness, and N/V x 2 weeks.  Wife and pt state that the pt takes all his meds at one time, in the morning.  Wife is convinced that the increase in lisinopril is causing the pts symptoms.  Pt and wife state that the symptoms start about an hour after medication administration.  Wife states that his BP and HR have been great, with his last reading at 126/60 and HR-62.  Wife would like for Dr Delton SeeNelson to advise if this is the culprit to the pts complaints.  Wife and pt both state he has no other complaints, and no cardiac complaints.  Informed both parties that Dr Delton SeeNelson is currently out of the office today, but I will route this message to her for further review and recommendation and follow-up thereafter.  Also informed both parties that I will go and speak with one of our PharmD's to review this message and advise as well.  Will follow-up with them with their recommendations as well.  Advised the pt to continue on his current regimen as prescribed.  Advised the pt that he should take Tramadol q6hrs only as needed for pain.  Take this with food.  Both verbalized understanding and agrees with this plan.

## 2015-07-07 NOTE — Telephone Encounter (Signed)
Pt having nausea and vomiting about 2 week, thinks it's from heart med pls call [phone]

## 2015-07-10 ENCOUNTER — Encounter: Payer: BLUE CROSS/BLUE SHIELD | Admitting: Cardiothoracic Surgery

## 2015-07-24 ENCOUNTER — Ambulatory Visit (INDEPENDENT_AMBULATORY_CARE_PROVIDER_SITE_OTHER): Payer: BLUE CROSS/BLUE SHIELD | Admitting: Cardiothoracic Surgery

## 2015-07-24 ENCOUNTER — Encounter: Payer: Self-pay | Admitting: Cardiothoracic Surgery

## 2015-07-24 VITALS — BP 145/94 | HR 68 | Resp 16 | Ht 71.0 in | Wt 242.0 lb

## 2015-07-24 DIAGNOSIS — Z951 Presence of aortocoronary bypass graft: Secondary | ICD-10-CM

## 2015-07-24 NOTE — Progress Notes (Signed)
[address]       [place] [address]             [phone]      [name] Medical Record #[id number] Date of Birth: 02-27-1966  Referring: Lars Masson, MD Primary Care: Jarrett Soho, PA-C  Chief Complaint:   POST OP FOLLOW UP 04/18/2015 OPERATIVE REPORT PREOPERATIVE DIAGNOSIS: Coronary occlusive disease with recent non-ST- elevation myocardial infarction. POSTOPERATIVE DIAGNOSIS: Coronary occlusive disease with recent non-ST- elevation myocardial infarction. SURGICAL PROCEDURE: Coronary artery bypass grafting x4 with left internal mammary to the left anterior descending coronary artery. Left radial coronary artery grafted to the circumflex coronary artery. Reverse saphenous vein graft to the intermediate coronary artery. Reverse saphenous vein graft to the posterior descending coronary artery with right greater saphenous vein harvesting endoscopically from the right thigh and open harvesting of the left radial artery. SURGEON: Sheliah Plane, MD.  History of Present Illness:     Patient returns to the office today in follow-up after coronary artery bypass grafting  surgery was done on January 27. Patient had coronary bypass grafting 4 with 2 arterial grafts he subsequently returned on February 28 with chest pain and repeat cardiac catheterization showed clot in a vein graft to the intermediate with significant competitive flow, no intervention was performed and was decided to continue to treat the patient medically. The arterial grafts all had good flow.  The patient is improving well, is stop smoking and was proud of the fact that his cholesterol had dropped in half since it was last checked.  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 He notes he's had no difficulty doing this and it does not involve lifting over proximal 35 pounds.  He has just returned from vacation in  [place].  Overall he notes he feels much better than he has in months and has had no recurrent angina.       Past Medical History  Diagnosis Date  . Hypertension   . Depression   . Anxiety   . Hyperlipidemia   . NSTEMI (non-ST elevated myocardial infarction) (HCC) 04/15/2015  . Lumbar herniated disc dx'd 03/2015     History  Smoking status  . Former Smoker -- 1.50 packs/day for 33 years  . Types: Cigarettes  . Quit date: 04/15/2015  Smokeless tobacco  . Never Used    History  Alcohol Use  . 0.0 oz/week  . 0 Standard drinks or equivalent per week    Comment: 04/15/2015 "stopped drinking in ~ 11/2014"     Allergies  Allergen Reactions  . Claritin [Loratadine] Other (See Comments)    Makes allergies worse-"a lot worse"  . Codeine Nausea And Vomiting  . Vicodin [Hydrocodone-Acetaminophen] Nausea And Vomiting    "pretty much any codeine"    Current Outpatient Prescriptions  Medication Sig Dispense Refill  . amiodarone (PACERONE) 200 MG tablet Take 1 tablet (200 mg total) by mouth daily. 30 tablet 2  . amLODipine (NORVASC) 2.5 MG tablet Take 1 tablet (2.5 mg total) by mouth daily. 30 tablet 5  . aspirin EC 81 MG EC tablet Take 1 tablet (81 mg total) by mouth daily.    Marland Kitchen atorvastatin (LIPITOR) 80 MG tablet Take 1 tablet (80 mg total) by mouth daily at 6 PM. 30 tablet 3  . clopidogrel (PLAVIX) 75 MG tablet Take 1 tablet (75 mg total) by mouth daily. 30 tablet 11  . FLUoxetine (PROZAC) 20 MG capsule  Take 30 mg by mouth daily.     . isosorbide mononitrate (IMDUR) 60 MG 24 hr tablet Take 1 tablet (60 mg total) by mouth daily. 30 tablet 5  . lisinopril (PRINIVIL,ZESTRIL) 40 MG tablet Take 1 tablet (40 mg total) by mouth daily. 30 tablet 5  . nebivolol (BYSTOLIC) 10 MG tablet Take 10 mg by mouth daily.    . nitroGLYCERIN (NITROSTAT) 0.4 MG SL tablet Place 1 tablet (0.4 mg total) under the tongue every 5 (five) minutes x 3 doses as needed for chest pain. 25 tablet 2  .  pantoprazole (PROTONIX) 40 MG tablet Take 1 tablet (40 mg total) by mouth daily. 30 tablet 5  . tamsulosin (FLOMAX) 0.4 MG CAPS capsule Take 0.4 mg by mouth daily.  5  . traMADol (ULTRAM) 50 MG tablet Take 1 tablet (50 mg total) by mouth every 6 (six) hours as needed. 40 tablet 0   No current facility-administered medications for this visit.    Left Heart Cath and Cors/Grafts Angiography    Conclusion     Mid Cx lesion, 95% stenosed.  Mid Cx to Dist Cx lesion, 95% stenosed.  Prox LAD to Mid LAD lesion, 50% stenosed.  Ost Ramus lesion, 60% stenosed.  Prox Cx lesion, 95% stenosed.  Prox RCA-1 lesion, 75% stenosed.  Prox RCA-2 lesion, 80% stenosed.  Mid RCA lesion, 90% stenosed.  Dist RCA lesion, 60% stenosed.  RPDA lesion, 60% stenosed.  SVG to the PDA was injected is very large, and is anatomically normal.  There is competitive flow.  SVG to the ramus intermediate was injected is very large.  Mid Graft to Insertion lesion, 100% stenosed.  Radial artery graft to the OM was injected is normal in caliber, and is anatomically normal.  There is competitive flow.  LIMA to the LAD was injected is normal in caliber, and is anatomically normal.  The left ventricular systolic function is normal.  1. Severe 3 vessel obstructive CAD 2. Patent LIMA to the LAD 3. Patent free radial graft to the OM. This arises from the hood of the SVG to the ramus intermediate branch 4. Occluded mid SVG to the ramus intermediate with thrombus. 5. Patent SVG to the PDA 6. Good LV function.  Plan: It appears that the culprit lesion is occlusion of the SVG to the ramus intermediate. The native vessel does have moderate ostial stenosis but has TIMI 3 flow without other obstructive disease. It is possible that with occlusion of the SVG there may have been some thrombus showered downstream. At this point would treat medically      Physical Exam: BP 145/94 mmHg  Pulse 68  Resp 16  Ht 5'  11" (1.803 m)  Wt 242 lb (109.77 kg)  BMI 33.77 kg/m2  SpO2 98%  General appearance: alert, cooperative and appears stated age Neurologic: intact Heart: regular rate and rhythm, S1, S2 normal, no murmur, click, rub or gallop Lungs: clear to auscultation bilaterally Abdomen: soft, non-tender; bowel sounds normal; no masses,  no organomegaly Extremities: extremities normal, atraumatic, no cyanosis or edema and Homans sign is negative, no sign of DVT Wound: sternum well healed , leg sites and arm well healed    Diagnostic Studies & Laboratory data:     Recent Radiology Findings:   No results found.    Recent Lab Findings: Lab Results  Component Value Date   WBC 7.1 05/20/2015   HGB 13.0 05/20/2015   HCT 39.3 05/20/2015   PLT 160 05/20/2015  GLUCOSE 119* 05/20/2015   CHOL 140 05/20/2015   TRIG 108 05/20/2015   HDL 37* 05/20/2015   LDLCALC 81 05/20/2015   ALT 25 04/17/2015   AST 24 04/17/2015   NA 140 05/20/2015   K 3.3* 05/20/2015   CL 103 05/20/2015   CREATININE 0.81 05/20/2015   BUN 10 05/20/2015   CO2 23 05/20/2015   TSH 2.112 04/19/2015   INR 1.14 05/20/2015   HGBA1C 5.1 05/20/2015      Assessment / Plan:      Patient stable after recent coronary artery bypass grafting and subsequent repeat cardiac catheterization after surgery. I've encouraged the patient to stay off the cigarettes as he has since surgery, continue with aspirin and statin and beta blocker He will discuss with cardiology when to stop Cordarone. I plan to see him back as needed    Delight OvensEdward B Mariyah Upshaw MD      [address] [place] [id number] Office [phone]   Beeper [phone]  07/24/2015 9:42 AM

## 2015-08-06 ENCOUNTER — Ambulatory Visit (INDEPENDENT_AMBULATORY_CARE_PROVIDER_SITE_OTHER): Payer: BLUE CROSS/BLUE SHIELD | Admitting: Cardiology

## 2015-08-06 ENCOUNTER — Encounter: Payer: Self-pay | Admitting: Cardiology

## 2015-08-06 VITALS — BP 136/94 | HR 53 | Ht 72.0 in | Wt 247.2 lb

## 2015-08-06 DIAGNOSIS — I2583 Coronary atherosclerosis due to lipid rich plaque: Secondary | ICD-10-CM

## 2015-08-06 DIAGNOSIS — I480 Paroxysmal atrial fibrillation: Secondary | ICD-10-CM

## 2015-08-06 DIAGNOSIS — Z951 Presence of aortocoronary bypass graft: Secondary | ICD-10-CM | POA: Diagnosis not present

## 2015-08-06 DIAGNOSIS — I10 Essential (primary) hypertension: Secondary | ICD-10-CM | POA: Diagnosis not present

## 2015-08-06 DIAGNOSIS — I214 Non-ST elevation (NSTEMI) myocardial infarction: Secondary | ICD-10-CM | POA: Diagnosis not present

## 2015-08-06 DIAGNOSIS — Z79899 Other long term (current) drug therapy: Secondary | ICD-10-CM

## 2015-08-06 DIAGNOSIS — I2510 Atherosclerotic heart disease of native coronary artery without angina pectoris: Secondary | ICD-10-CM | POA: Diagnosis not present

## 2015-08-06 NOTE — Progress Notes (Signed)
08/06/2015 [name]   1966-01-10  [id number]  Primary Physician Jarrett Soho, PA-C Primary Cardiologist: Dr. Delton See   Reason for Visit/CC: [facility] F/u for NSTEMI  HPI:  50 y/o male, followed by Dr. Delton See, with newly diagnosed CAD s/p recent CABG x 4 04/18/2015 by Dr. Tyrone Sage (LIMA to LAD, Left radial artery to OM, SVG to Intermediate, and SVG to PD), post operative atrial fibrillation managed with amiodarone, HTN, depression and anxiety. He was discharged home 04/22/2015 by CT Surgery.   He presented back to [facility] on 05/19/15 with recurrent CP and hypertensive urgency and enzyme elevation c/w NSTEMI. Patient was admitted to telemetry. Cardiac enzymes were cycled and peaked at 33. He was placed on IV heparin and nitro. BP improved. He underwent a LHC which demonstrated occlusion of the SVG to the ramus intermediate. The native vessel does have moderate ostial stenosis but has TIMI 3 flow without other obstructive disease. It is possible that with occlusion of the SVG there may have been some thrombus showered downstream. His remaining 3 grafts were patent. Medical management was recommended. He was continued on ASA, a statin, BB and ARB. Plavix and a long acting nitrate were added to his regimen. He was also prescribed PRN SL NTG. He did well with medical therapy. He denied any recurrent CP. No dyspnea. He had no post cath complications. His renal function and cath sight remained stable. He had slightly elevated BPs, thus low dose amlodipine was also added to his regimen. He had no difficulties ambulating with cardiac rehab. EF was assessed by echo and was was normal at 50-55%.  08/06/2015 - 2 months follow up, at the last visit his BP was elevated and BP meds increased.  He then called complaining of feeling lightheaded and nauseated with the new medications. His lisinopril was changed to be taken at night. His symptoms have resolved. 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  Denies palpitations or syncope. No a-fib since post surgery.   Current Outpatient Prescriptions  Medication Sig Dispense Refill  . amiodarone (PACERONE) 200 MG tablet Take 1 tablet (200 mg total) by mouth daily. 30 tablet 2  . amLODipine (NORVASC) 2.5 MG tablet Take 1 tablet (2.5 mg total) by mouth daily. 30 tablet 5  . aspirin EC 81 MG EC tablet Take 1 tablet (81 mg total) by mouth daily.    Marland Kitchen atorvastatin (LIPITOR) 80 MG tablet Take 1 tablet (80 mg total) by mouth daily at 6 PM. 30 tablet 3  . clopidogrel (PLAVIX) 75 MG tablet Take 1 tablet (75 mg total) by mouth daily. 30 tablet 11  . FLUoxetine (PROZAC) 20 MG capsule Take 30 mg by mouth daily.     . isosorbide mononitrate (IMDUR) 60 MG 24 hr tablet Take 1 tablet (60 mg total) by mouth daily. 30 tablet 5  . lisinopril (PRINIVIL,ZESTRIL) 40 MG tablet Take 1 tablet (40 mg total) by mouth daily. 30 tablet 5  . nebivolol (BYSTOLIC) 10 MG tablet Take 10 mg by mouth daily.    . nitroGLYCERIN (NITROSTAT) 0.4 MG SL tablet Place 1 tablet (0.4 mg total) under the tongue every 5 (five) minutes x 3 doses as needed for chest pain. 25 tablet 2  . pantoprazole (PROTONIX) 40 MG tablet Take 1 tablet (40 mg total) by mouth daily. 30 tablet 5  . tamsulosin (FLOMAX) 0.4 MG CAPS capsule Take 0.4 mg by mouth daily.  5  .  traMADol (ULTRAM) 50 MG tablet Take 1 tablet (50 mg total) by mouth every 6 (six) hours as needed. 40 tablet 0   No current facility-administered medications for this visit.    Allergies  Allergen Reactions  . Claritin [Loratadine] Other (See Comments)    Makes allergies worse-"a lot worse"  . Codeine Nausea And Vomiting  . Vicodin [Hydrocodone-Acetaminophen] Nausea And Vomiting    "pretty much any codeine"    Social History   Social History  . Marital Status: Married    Spouse Name: [name]  . Number of Children: [name]  . Years of Education: [name]   Occupational History  . Not on  file.   Social History Main Topics  . Smoking status: Former Smoker -- 1.50 packs/day for 33 years    Types: Cigarettes    Quit date: 04/15/2015  . Smokeless tobacco: Never Used  . Alcohol Use: 0.0 oz/week    0 Standard drinks or equivalent per week     Comment: 04/15/2015 "stopped drinking in ~ 11/2014"  . Drug Use: Yes    Special: Marijuana, Cocaine     Comment: "in my teens"  . Sexual Activity: Not on file   Other Topics Concern  . Not on file   Social History Narrative     Review of Systems: General: negative for chills, fever, night sweats or weight changes.  Cardiovascular: negative for chest pain, dyspnea on exertion, edema, orthopnea, palpitations, paroxysmal nocturnal dyspnea or shortness of breath Dermatological: negative for rash Respiratory: negative for cough or wheezing Urologic: negative for hematuria Abdominal: negative for nausea, vomiting, diarrhea, bright red blood per rectum, melena, or hematemesis Neurologic: negative for visual changes, syncope, or dizziness All other systems reviewed and are otherwise negative except as noted above.    Blood pressure 136/94, pulse 53, height 6' (1.829 m), weight 247 lb 3.2 oz (112.129 kg), SpO2 98 %.  General appearance: alert, cooperative and no distress Neck: no carotid bruit and no JVD Lungs: clear to auscultation bilaterally Heart: regular rate and rhythm, S1, S2 normal, no murmur, click, rub or gallop Extremities: no LEE Pulses: 2+ and symmetric Skin: warm and dry Neurologic: Grossly normal  EKG not performed    ASSESSMENT AND PLAN:   1. CAD: s/p NSTEMI in 03/2014 treated with CABG x 4. He had a second NSTEMI 04/2014 with cath revealing a failed SVG to RI. His other 3 grafts were patent. EF normal. Medical therapy elected. ASA, Plavix, Imdur, Lipitor, Bystolic, Lisinopril and Amlodipine. Asymptomatic, no workup needed right now.  2. Post operative atrial fibrillation: occurred post CABG. He had no documented  recurrence during his 2nd admission. Patient denies any breakthrough symptoms of atrial fibrillation. Physical exam reveals RRR. I will discontinue amiodarone today.  3. HTN: now controlled and meds well tolerated when some taken in the am and some at night.  4. Hyperlipidemia: Four-week follow-up lipid panel showed significant reduction in LDL down from 127-81 mg/dL. He has tolerated statin therapy well w/o side effects. He is to continue Lipitor 80 mg.   5. Tobacco Abuse: patient reports that he quit smoking completely. He was congratulated on his efforts and instructed to continue to refrain from further use.   PLAN  F/u with in 3 months.   Tobias AlexanderKatarina Talyssa Gibas PA-C 08/06/2015 11:17 AM

## 2015-08-06 NOTE — Patient Instructions (Signed)
Your physician has recommended you make the following change in your medication:  1.) STOP AMIODARONE  Your physician recommends that you return for lab work in: 3 MONTHS WHEN YOU COME FOR YOUR NEXT APPOINTMENT  Your physician recommends that you schedule a follow-up appointment in: 3 MONTHS WITH DR Delton SeeNELSON.

## 2015-08-07 DIAGNOSIS — I10 Essential (primary) hypertension: Secondary | ICD-10-CM | POA: Insufficient documentation

## 2015-08-07 DIAGNOSIS — I2510 Atherosclerotic heart disease of native coronary artery without angina pectoris: Secondary | ICD-10-CM | POA: Insufficient documentation

## 2015-08-07 DIAGNOSIS — I480 Paroxysmal atrial fibrillation: Secondary | ICD-10-CM | POA: Insufficient documentation

## 2015-08-07 DIAGNOSIS — I2583 Coronary atherosclerosis due to lipid rich plaque: Secondary | ICD-10-CM

## 2015-08-07 DIAGNOSIS — Z951 Presence of aortocoronary bypass graft: Secondary | ICD-10-CM | POA: Insufficient documentation

## 2015-08-07 DIAGNOSIS — Z79899 Other long term (current) drug therapy: Secondary | ICD-10-CM | POA: Insufficient documentation

## 2015-08-09 ENCOUNTER — Other Ambulatory Visit: Payer: Self-pay | Admitting: Physician Assistant

## 2015-08-11 ENCOUNTER — Other Ambulatory Visit: Payer: Self-pay | Admitting: Physician Assistant

## 2015-08-12 ENCOUNTER — Encounter: Payer: Self-pay | Admitting: Cardiology

## 2015-08-12 ENCOUNTER — Encounter: Payer: Self-pay | Admitting: *Deleted

## 2015-09-12 ENCOUNTER — Other Ambulatory Visit: Payer: Self-pay | Admitting: Physician Assistant

## 2015-09-25 ENCOUNTER — Other Ambulatory Visit: Payer: Self-pay | Admitting: Cardiology

## 2015-10-04 ENCOUNTER — Telehealth: Payer: Self-pay | Admitting: Physician Assistant

## 2015-10-04 NOTE — Telephone Encounter (Signed)
Paged by answering service, [facility]. RN Shantel requesting echo/cath report. Will fax. She will sent cardiac release paper work to our office Monday.

## 2015-10-06 ENCOUNTER — Other Ambulatory Visit: Payer: Self-pay | Admitting: *Deleted

## 2015-10-06 DIAGNOSIS — Z0289 Encounter for other administrative examinations: Secondary | ICD-10-CM

## 2015-10-07 ENCOUNTER — Ambulatory Visit (HOSPITAL_COMMUNITY)
Admission: RE | Admit: 2015-10-07 | Discharge: 2015-10-07 | Disposition: A | Discharge status: Home / Self Care | Payer: BLUE CROSS/BLUE SHIELD | Source: Ambulatory Visit | Attending: Cardiology | Admitting: Cardiology

## 2015-10-07 DIAGNOSIS — Z0289 Encounter for other administrative examinations: Secondary | ICD-10-CM | POA: Insufficient documentation

## 2015-10-07 LAB — EXERCISE TOLERANCE TEST
Estimated workload: 10.1 METS
Exercise duration (min): 8 min
Exercise duration (sec): 59 s
MPHR: 168 {beats}/min
Peak HR: 142 {beats}/min
Percent HR: 84 %
Rest HR: 59 {beats}/min

## 2015-10-09 ENCOUNTER — Encounter: Payer: Self-pay | Admitting: Cardiology

## 2015-10-10 MED ORDER — AMLODIPINE BESYLATE 5 MG PO TABS: 5.0000 mg | ORAL_TABLET | Freq: Every day | ORAL | Status: DC

## 2015-10-10 NOTE — Telephone Encounter (Signed)
I spoke with patient, pt agreed to increase amlodipine 5mg  daily, will send in BP readings in 10-14 days.

## 2015-10-10 NOTE — Telephone Encounter (Signed)
-----   Message from Lars MassonKatarina H [name], MD sent at 10/09/2015 10:22 PM EDT ----- This patient left me a message about elevated BP. Please see bellow and see his cell phone number. Please call him that I would like to increase his amlodipine to 5 mg po daily and have him check his blood pressure and send it to us. I will adjust as needed.  Thank you, KN  ----- Message -----     From: Guy SandiferBrian Rivkin    Sent: 10/09/2015 11:05 AM     To: [facility] Triage   Subject: Non-Urgent Medical Question               My blood pressure has been getting higher for some reason. 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 I have my scheduled 3 month appointment for August 18th with Dr. Delton SeeNelson, but I wondered if my meds need to be adjusted to lower my b/p. I have been monitoring it and the lowest it has been is 157/97.   Arlys JohnBrian Howton cell #[phone]

## 2015-10-28 ENCOUNTER — Telehealth: Payer: Self-pay | Admitting: *Deleted

## 2015-10-28 ENCOUNTER — Encounter: Payer: Self-pay | Admitting: Cardiology

## 2015-10-28 MED ORDER — AMLODIPINE BESYLATE 10 MG PO TABS: 10.0000 mg | ORAL_TABLET | Freq: Every day | ORAL | 3 refills | Status: DC

## 2015-10-28 MED ORDER — HYDROCHLOROTHIAZIDE 25 MG PO TABS: 25.0000 mg | ORAL_TABLET | Freq: Every day | ORAL | 3 refills | Status: DC

## 2015-10-28 NOTE — Telephone Encounter (Signed)
Notified the pt that per Dr Delton SeeNelson, she reviewed his mychart email sent with current BP readings and she recommends that we increase his amlodipine to 10 mg po daily and start him on HCTZ 25 mg po daily.  Confirmed the pharmacy of choice with the pt.  Pt verbalized understanding, agrees with this plan, and gracious for all the assistance provided.

## 2015-10-28 NOTE — Telephone Encounter (Signed)
From: Lars MassonKatarina H [name], MD  Sent: 10/28/2015  4:24 PM  To: Loa SocksIvy M Alvina Strother, LPN, Dossie ArbourPatricia L Adelman, RN  Subject: RE: Non-Urgent Medical Question           Please increase amlodipine to 10 mg po daily and add HCTZ 25 mg po daily.  Thank you

## 2015-11-06 ENCOUNTER — Other Ambulatory Visit: Payer: Self-pay | Admitting: Cardiology

## 2015-11-06 NOTE — Telephone Encounter (Signed)
REFILL 

## 2015-11-07 ENCOUNTER — Other Ambulatory Visit: Payer: BLUE CROSS/BLUE SHIELD | Admitting: *Deleted

## 2015-11-07 ENCOUNTER — Ambulatory Visit (INDEPENDENT_AMBULATORY_CARE_PROVIDER_SITE_OTHER): Payer: BLUE CROSS/BLUE SHIELD | Admitting: Cardiology

## 2015-11-07 ENCOUNTER — Encounter: Payer: Self-pay | Admitting: Cardiology

## 2015-11-07 VITALS — BP 138/72 | HR 64 | Ht 72.0 in | Wt 255.0 lb

## 2015-11-07 DIAGNOSIS — I2510 Atherosclerotic heart disease of native coronary artery without angina pectoris: Secondary | ICD-10-CM

## 2015-11-07 DIAGNOSIS — I214 Non-ST elevation (NSTEMI) myocardial infarction: Secondary | ICD-10-CM

## 2015-11-07 DIAGNOSIS — I25810 Atherosclerosis of coronary artery bypass graft(s) without angina pectoris: Secondary | ICD-10-CM | POA: Diagnosis not present

## 2015-11-07 DIAGNOSIS — I161 Hypertensive emergency: Secondary | ICD-10-CM

## 2015-11-07 DIAGNOSIS — I10 Essential (primary) hypertension: Secondary | ICD-10-CM

## 2015-11-07 DIAGNOSIS — I480 Paroxysmal atrial fibrillation: Secondary | ICD-10-CM

## 2015-11-07 DIAGNOSIS — I2583 Coronary atherosclerosis due to lipid rich plaque: Secondary | ICD-10-CM

## 2015-11-07 DIAGNOSIS — I119 Hypertensive heart disease without heart failure: Secondary | ICD-10-CM

## 2015-11-07 DIAGNOSIS — Z951 Presence of aortocoronary bypass graft: Secondary | ICD-10-CM

## 2015-11-07 DIAGNOSIS — E785 Hyperlipidemia, unspecified: Secondary | ICD-10-CM

## 2015-11-07 LAB — CBC WITH DIFFERENTIAL/PLATELET
Basophils Absolute: 0 cells/uL (ref 0–200)
Basophils Relative: 0 %
Eosinophils Absolute: 200 cells/uL (ref 15–500)
Eosinophils Relative: 4 %
HCT: 42.7 % (ref 38.5–50.0)
Hemoglobin: 15.1 g/dL (ref 13.2–17.1)
Lymphocytes Relative: 22 %
Lymphs Abs: 1100 cells/uL (ref 850–3900)
MCH: 30 pg (ref 27.0–33.0)
MCHC: 35.4 g/dL (ref 32.0–36.0)
MCV: 84.7 fL (ref 80.0–100.0)
MPV: 11.3 fL (ref 7.5–12.5)
Monocytes Absolute: 400 cells/uL (ref 200–950)
Monocytes Relative: 8 %
Neutro Abs: 3300 cells/uL (ref 1500–7800)
Neutrophils Relative %: 66 %
Platelets: 137 10*3/uL — ABNORMAL LOW (ref 140–400)
RBC: 5.04 MIL/uL (ref 4.20–5.80)
RDW: 14 % (ref 11.0–15.0)
WBC: 5 10*3/uL (ref 3.8–10.8)

## 2015-11-07 LAB — BASIC METABOLIC PANEL
BUN: 16 mg/dL (ref 7–25)
CO2: 22 mmol/L (ref 20–31)
Calcium: 8.9 mg/dL (ref 8.6–10.3)
Chloride: 105 mmol/L (ref 98–110)
Creat: 0.79 mg/dL (ref 0.70–1.33)
Glucose, Bld: 106 mg/dL — ABNORMAL HIGH (ref 65–99)
Potassium: 3.3 mmol/L — ABNORMAL LOW (ref 3.5–5.3)
Sodium: 138 mmol/L (ref 135–146)

## 2015-11-07 LAB — HEPATIC FUNCTION PANEL
ALT: 18 U/L (ref 9–46)
AST: 15 U/L (ref 10–35)
Albumin: 4.1 g/dL (ref 3.6–5.1)
Alkaline Phosphatase: 91 U/L (ref 40–115)
Bilirubin, Direct: 0.2 mg/dL (ref <=0.2)
Indirect Bilirubin: 0.8 mg/dL (ref 0.2–1.2)
Total Bilirubin: 1 mg/dL (ref 0.2–1.2)
Total Protein: 7.2 g/dL (ref 6.1–8.1)

## 2015-11-07 NOTE — Patient Instructions (Signed)

## 2015-11-07 NOTE — Progress Notes (Addendum)
11/07/2015 [name]   September 10, 1965  [id number]  Primary Physician Jeffery Soho, PA-C Primary Cardiologist: Dr. Delton Griffin  Reason for Visit/CC: [facility] F/u for NSTEMI  HPI:  50 y/o male, followed by Dr. Delton Griffin, with newly diagnosed CAD s/p recent CABG x 4 04/18/2015 by Dr. Tyrone Griffin (LIMA to LAD, Left radial artery to OM, SVG to Intermediate, and SVG to PD), post operative atrial fibrillation managed with amiodarone, HTN, depression and anxiety. He was discharged home 04/22/2015 by CT Surgery.   He presented back to [facility] on 05/19/15 with recurrent CP and hypertensive urgency and enzyme elevation c/w NSTEMI. Patient was admitted to telemetry. Cardiac enzymes were cycled and peaked at 33. He was placed on IV heparin and nitro. BP improved. He underwent a LHC which demonstrated occlusion of the SVG to the ramus intermediate. The native vessel does have moderate ostial stenosis but has TIMI 3 flow without other obstructive disease. It is possible that with occlusion of the SVG there may have been some thrombus showered downstream. His remaining 3 grafts were patent. Medical management was recommended. He was continued on ASA, a statin, BB and ARB. Plavix and a long acting nitrate were added to his regimen. He was also prescribed PRN SL NTG. He did well with medical therapy. He denied any recurrent CP. No dyspnea. He had no post cath complications. His renal function and cath sight remained stable. He had slightly elevated BPs, thus low dose amlodipine was also added to his regimen. He had no difficulties ambulating with cardiac rehab. EF was assessed by echo and was was normal at 50-55%.  08/06/2015 - 2 months follow up, at the last visit his BP was elevated and BP meds increased.  He then called complaining of feeling lightheaded and nauseated with the new medications. His lisinopril was changed to be taken at night. His symptoms have resolved. 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  Denies palpitations or syncope. No a-fib since post surgery.   11/06/2015 - the patient is coming after 3 months, he has felt really well, he works as a Naval architect and carries heavy stuff and has no issues with regards of chest pain or shortness of breath. He also denies any palpitations claudications or syncope. No lower extremity edema orthopnea or paroxysmal nocturnal dyspnea. In the interim he has been dealing with high blood pressures and we added hydrochlorothiazide and increase amlodipine to 10 mg daily. He has been compliant with his medications and has no problems [name] to bleeding on dual antiplatelet therapy and no muscle pain with atorvastatin.  Current Outpatient Prescriptions  Medication Sig Dispense Refill  . amLODipine (NORVASC) 10 MG tablet Take 1 tablet by mouth daily.    Marland Kitchen aspirin EC 81 MG EC tablet Take 1 tablet (81 mg total) by mouth daily.    Marland Kitchen atorvastatin (LIPITOR) 80 MG tablet TAKE 1 TABLET EVERY DAY AT 6PM 30 tablet 10  . clopidogrel (PLAVIX) 75 MG tablet Take 1 tablet (75 mg total) by mouth daily. 30 tablet 11  . FLUoxetine (PROZAC) 20 MG capsule Take 30 mg by mouth daily.     . hydrochlorothiazide (HYDRODIURIL) 25 MG tablet Take 1 tablet (25 mg total) by mouth daily. 90 tablet 3  . isosorbide mononitrate (IMDUR) 60 MG 24 hr tablet TAKE 1 TABLET (60 MG TOTAL) BY MOUTH DAILY. 30 tablet 0  . lisinopril (PRINIVIL,ZESTRIL) 40 MG tablet Take 1 tablet (  40 mg total) by mouth daily. 30 tablet 5  . nebivolol (BYSTOLIC) 10 MG tablet Take 10 mg by mouth daily.    . nitroGLYCERIN (NITROSTAT) 0.4 MG SL tablet Place 1 tablet (0.4 mg total) under the tongue every 5 (five) minutes x 3 doses as needed for chest pain. 25 tablet 2  . pantoprazole (PROTONIX) 40 MG tablet TAKE 1 TABLET (40 MG TOTAL) BY MOUTH DAILY. 30 tablet 0  . tamsulosin (FLOMAX) 0.4 MG CAPS capsule Take 0.4 mg by mouth daily.  5  . traMADol (ULTRAM) 50 MG  tablet Take 1 tablet (50 mg total) by mouth every 6 (six) hours as needed. 40 tablet 0   No current facility-administered medications for this visit.     Allergies  Allergen Reactions  . Claritin [Loratadine] Other (Griffin Comments)    Makes allergies worse-"a lot worse"  . Codeine Nausea And Vomiting  . Vicodin [Hydrocodone-Acetaminophen] Nausea And Vomiting    "pretty much any codeine"    Social History   Social History  . Marital status: Married    Spouse name: [name]  . Number of children: [name]  . Years of education: [name]   Occupational History  . Not on file.   Social History Main Topics  . Smoking status: Former Smoker    Packs/day: 1.50    Years: 33.00    Types: Cigarettes    Quit date: 04/15/2015  . Smokeless tobacco: Never Used  . Alcohol use 0.0 oz/week     Comment: 04/15/2015 "stopped drinking in ~ 11/2014"  . Drug use:     Types: Marijuana, Cocaine     Comment: "in my teens"  . Sexual activity: Not on file   Other Topics Concern  . Not on file   Social History Narrative  . No narrative on file    Review of Systems: General: negative for chills, fever, night sweats or weight changes.  Cardiovascular: negative for chest pain, dyspnea on exertion, edema, orthopnea, palpitations, paroxysmal nocturnal dyspnea or shortness of breath Dermatological: negative for rash Respiratory: negative for cough or wheezing Urologic: negative for hematuria Abdominal: negative for nausea, vomiting, diarrhea, bright red blood per rectum, melena, or hematemesis Neurologic: negative for visual changes, syncope, or dizziness All other systems reviewed and are otherwise negative except as noted above.  Blood pressure 138/72, pulse 64, height 6' (1.829 m), weight 255 lb (115.7 kg).  General appearance: alert, cooperative and no distress Neck: no carotid bruit and no JVD Lungs: clear to auscultation bilaterally Heart: regular rate and rhythm, S1, S2 normal, no murmur, click, rub or  gallop Extremities: no LEE Pulses: 2+ and symmetric Skin: warm and dry Neurologic: Grossly normal  EKG not performed    ASSESSMENT AND PLAN:   1. CAD: s/p NSTEMI in 03/2014 treated with CABG x 4. He had a second NSTEMI 04/2014 with cath revealing a failed SVG to RI. His other 3 grafts were patent. EF normal. Medical therapy elected. ASA, Plavix, Imdur, Lipitor, Bystolic, Lisinopril and Amlodipine. Asymptomatic, no workup needed right now.  2. Post operative atrial fibrillation: occurred post CABG. He had no documented recurrence during his 2nd admission. Patient denies any breakthrough symptoms of atrial fibrillation. Physical exam reveals RRR. I will discontinue amiodarone today.  3. HTN: Now controlled after increasing amlodipine to 10 mg daily and adding hydrochlorothiazide 25 mg daily, we'll also continue nebivolol 10 mg daily, Imdur 60 mg daily  4. Hyperlipidemia: Four-week follow-up lipid panel showed significant reduction in LDL down from 127-81  mg/dL. He has tolerated statin therapy well w/o side effects. He is to continue Lipitor 80 mg.   5. Tobacco Abuse: patient reports that he quit smoking completely. He was congratulated on his efforts and instructed to continue to refrain from further use.   PLAN  F/u with in 3 months.   Tobias AlexanderKatarina Tiney Zipper , MD 11/07/2015 10:00 AM

## 2015-11-10 ENCOUNTER — Telehealth: Payer: Self-pay | Admitting: *Deleted

## 2015-11-10 MED ORDER — POTASSIUM CHLORIDE ER 10 MEQ PO TBCR: 10.0000 meq | EXTENDED_RELEASE_TABLET | Freq: Every day | ORAL | 3 refills | Status: DC

## 2015-11-10 NOTE — Telephone Encounter (Signed)
-----   Message from Lars MassonKatarina H [name], MD sent at 11/10/2015  9:07 AM EDT ----- Normal crea, LFTs, but K persistently low, I would start him on KCl 10 mEq daily.

## 2015-11-10 NOTE — Telephone Encounter (Signed)
Notified the pt that per Dr Delton SeeNelson, his labs show normal creatinine, LFTs, but persistently low K, so she recommends that we start him on KCL 10 mEq po daily.  Instructed the pt to take this med with food.  Confirmed the pharmacy of choice with the pt.  Pt verbalized understanding and agrees with this plan.

## 2015-12-02 ENCOUNTER — Other Ambulatory Visit: Payer: Self-pay | Admitting: Cardiology

## 2015-12-09 ENCOUNTER — Other Ambulatory Visit: Payer: Self-pay | Admitting: Cardiology

## 2015-12-11 ENCOUNTER — Other Ambulatory Visit: Payer: Self-pay | Admitting: Cardiology

## 2016-04-29 ENCOUNTER — Encounter: Payer: Self-pay | Admitting: Cardiology

## 2016-05-05 ENCOUNTER — Encounter: Payer: Self-pay | Admitting: Cardiology

## 2016-05-05 ENCOUNTER — Ambulatory Visit (INDEPENDENT_AMBULATORY_CARE_PROVIDER_SITE_OTHER): Payer: BLUE CROSS/BLUE SHIELD | Admitting: Cardiology

## 2016-05-05 VITALS — BP 132/72 | HR 88 | Ht 72.0 in | Wt 255.0 lb

## 2016-05-05 DIAGNOSIS — I10 Essential (primary) hypertension: Secondary | ICD-10-CM | POA: Diagnosis not present

## 2016-05-05 DIAGNOSIS — Z951 Presence of aortocoronary bypass graft: Secondary | ICD-10-CM

## 2016-05-05 DIAGNOSIS — Z79899 Other long term (current) drug therapy: Secondary | ICD-10-CM | POA: Diagnosis not present

## 2016-05-05 DIAGNOSIS — I480 Paroxysmal atrial fibrillation: Secondary | ICD-10-CM

## 2016-05-05 DIAGNOSIS — I2510 Atherosclerotic heart disease of native coronary artery without angina pectoris: Secondary | ICD-10-CM | POA: Diagnosis not present

## 2016-05-05 DIAGNOSIS — I2583 Coronary atherosclerosis due to lipid rich plaque: Secondary | ICD-10-CM

## 2016-05-05 DIAGNOSIS — E785 Hyperlipidemia, unspecified: Secondary | ICD-10-CM | POA: Insufficient documentation

## 2016-05-05 LAB — COMPREHENSIVE METABOLIC PANEL
ALT: 25 IU/L (ref 0–44)
AST: 16 IU/L (ref 0–40)
Albumin/Globulin Ratio: 1.6 (ref 1.2–2.2)
Albumin: 4.4 g/dL (ref 3.5–5.5)
Alkaline Phosphatase: 111 IU/L (ref 39–117)
BUN/Creatinine Ratio: 17 (ref 9–20)
BUN: 15 mg/dL (ref 6–24)
Bilirubin Total: 0.7 mg/dL (ref 0.0–1.2)
CO2: 24 mmol/L (ref 18–29)
Calcium: 9.3 mg/dL (ref 8.7–10.2)
Chloride: 99 mmol/L (ref 96–106)
Creatinine, Ser: 0.89 mg/dL (ref 0.76–1.27)
GFR calc Af Amer: 114 mL/min/{1.73_m2} (ref >59)
GFR calc non Af Amer: 98 mL/min/{1.73_m2} (ref >59)
Globulin, Total: 2.8 g/dL (ref 1.5–4.5)
Glucose: 105 mg/dL — ABNORMAL HIGH (ref 65–99)
Potassium: 4.2 mmol/L (ref 3.5–5.2)
Sodium: 140 mmol/L (ref 134–144)
Total Protein: 7.2 g/dL (ref 6.0–8.5)

## 2016-05-05 LAB — CBC WITH DIFFERENTIAL/PLATELET
Basophils Absolute: 0 10*3/uL (ref 0.0–0.2)
Basos: 1 %
EOS (ABSOLUTE): 0.2 10*3/uL (ref 0.0–0.4)
Eos: 3 %
Hematocrit: 43.5 % (ref 37.5–51.0)
Hemoglobin: 15.8 g/dL (ref 13.0–17.7)
Immature Grans (Abs): 0 10*3/uL (ref 0.0–0.1)
Immature Granulocytes: 0 %
Lymphocytes Absolute: 1.3 10*3/uL (ref 0.7–3.1)
Lymphs: 22 %
MCH: 30.7 pg (ref 26.6–33.0)
MCHC: 36.3 g/dL — ABNORMAL HIGH (ref 31.5–35.7)
MCV: 85 fL (ref 79–97)
Monocytes Absolute: 0.5 10*3/uL (ref 0.1–0.9)
Monocytes: 8 %
Neutrophils Absolute: 4 10*3/uL (ref 1.4–7.0)
Neutrophils: 66 %
Platelets: 151 10*3/uL (ref 150–379)
RBC: 5.14 x10E6/uL (ref 4.14–5.80)
RDW: 13.8 % (ref 12.3–15.4)
WBC: 6 10*3/uL (ref 3.4–10.8)

## 2016-05-05 LAB — LIPID PANEL
Chol/HDL Ratio: 4.7 ratio units (ref 0.0–5.0)
Cholesterol, Total: 163 mg/dL (ref 100–199)
HDL: 35 mg/dL — ABNORMAL LOW (ref >39)
LDL Calculated: 92 mg/dL (ref 0–99)
Triglycerides: 181 mg/dL — ABNORMAL HIGH (ref 0–149)
VLDL Cholesterol Cal: 36 mg/dL (ref 5–40)

## 2016-05-05 LAB — TSH: TSH: 2.63 u[IU]/mL (ref 0.450–4.500)

## 2016-05-05 MED ORDER — NEBIVOLOL HCL 10 MG PO TABS: 10.0000 mg | ORAL_TABLET | Freq: Every day | ORAL | 3 refills | Status: DC

## 2016-05-05 MED ORDER — CLOPIDOGREL BISULFATE 75 MG PO TABS: 75.0000 mg | ORAL_TABLET | Freq: Every day | ORAL | 3 refills | Status: DC

## 2016-05-05 MED ORDER — ISOSORBIDE MONONITRATE ER 60 MG PO TB24: 60.0000 mg | ORAL_TABLET | Freq: Every day | ORAL | 3 refills | Status: DC

## 2016-05-05 MED ORDER — HYDROCHLOROTHIAZIDE 25 MG PO TABS: 25.0000 mg | ORAL_TABLET | Freq: Every day | ORAL | 3 refills | Status: DC

## 2016-05-05 MED ORDER — AMLODIPINE BESYLATE 10 MG PO TABS: 10.0000 mg | ORAL_TABLET | Freq: Every day | ORAL | 3 refills | Status: DC

## 2016-05-05 MED ORDER — ATORVASTATIN CALCIUM 80 MG PO TABS
ORAL_TABLET | ORAL | 3 refills | Status: DC
Start: 2016-05-05 — End: 2016-12-09

## 2016-05-05 MED ORDER — LISINOPRIL 40 MG PO TABS: 40.0000 mg | ORAL_TABLET | Freq: Every day | ORAL | 3 refills | Status: DC

## 2016-05-05 NOTE — Progress Notes (Signed)
05/05/2016 [name]   [birth year]  [id number]  Primary Physician Jeffery SohoWharton, [name], PA-C Primary Cardiologist: Dr. Delton SeeNelson  Reason for Visit/CC: [facility] F/u for NSTEMI  HPI:  [age] y/o male, followed by Dr. Delton SeeNelson, with newly diagnosed CAD s/p recent CABG x 4 04/18/2015 by Dr. Tyrone SageGerhardt (LIMA to LAD, Left radial artery to OM, SVG to Intermediate, and SVG to PD), post operative atrial fibrillation managed with amiodarone, HTN, depression and anxiety. He was discharged home 04/22/2015 by CT Surgery.   He presented [facility] to [facility] on [facility]15 with recurrent CP and hypertensive urgency and enzyme elevation c/w NSTEMI. Patient was admitted to telemetry. Cardiac enzymes were cycled and peaked at 33. He was placed on IV heparin and nitro. BP improved. He underwent a LHC which demonstrated occlusion of the SVG to the ramus intermediate. The native vessel does have moderate ostial stenosis but has TIMI 3 flow without other obstructive disease. It is possible that with occlusion of the SVG there may have been some thrombus showered downstream. His remaining 3 grafts were patent. Medical management was recommended. He was continued on ASA, a statin, BB and ARB. Plavix and a long acting nitrate were added to his regimen. He was also prescribed PRN SL NTG. He did well with medical therapy. He denied any recurrent CP. No dyspnea. He had no post cath complications. His renal function and cath sight remained stable. He had slightly elevated BPs, thus low dose amlodipine was also added to his regimen. He had no difficulties ambulating with cardiac rehab. EF was assessed by echo and was was normal at 50-55%.  08/06/2015 - 2 months follow up, at the last visit his BP was elevated and BP meds increased.  He then called complaining of feeling lightheaded and nauseated with the new medications. His lisinopril was changed to be taken at night. His symptoms have resolved. He now feels well, denies CP, DOE, he works as a  Hospital doctordriver and carries heavy objects without any symptoms.  Denies palpitations or syncope. No a-fib since post surgery.   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 He also denies any palpitations claudications or syncope. No lower extremity edema orthopnea or paroxysmal nocturnal dyspnea. In the interim he has been dealing with high blood pressures and we added hydrochlorothiazide and increase amlodipine to 10 mg daily. He has been compliant with his medications and has no problems [name] to bleeding on dual antiplatelet therapy and no muscle pain with atorvastatin.  05/05/2016 - the patient is coming after 6 months, the patient denies any chest pain shortness of breath no lower extremity edema orthopnea or proximal nocturnal dyspnea. He is tolerating Lipitor and all of his other medicines without any side effects. He works as a Naval architecttruck driver and is not very active but motivated to start walking again. Shortly after his surgery he was walking up to 1 mile a day and felt really well.  Current Outpatient Prescriptions  Medication Sig Dispense Refill  . amLODipine (NORVASC) 10 MG tablet Take 1 tablet by mouth daily.    Marland Kitchen. aspirin EC 81 MG EC tablet Take 1 tablet (81 mg total) by mouth daily.    Marland Kitchen. atorvastatin (LIPITOR) 80 MG tablet TAKE 1 TABLET EVERY DAY AT 6PM 30 tablet 10  . clopidogrel (PLAVIX) 75 MG tablet Take 1 tablet (  75 mg total) by mouth daily. 30 tablet 11  . FLUoxetine (PROZAC) 20 MG capsule Take 30 mg by mouth daily.     . isosorbide mononitrate (IMDUR) 60 MG 24 hr tablet TAKE 1 TABLET (60 MG TOTAL) BY MOUTH DAILY. 90 tablet 3  . lisinopril (PRINIVIL,ZESTRIL) 40 MG tablet TAKE 1 TABLET (40 MG TOTAL) BY MOUTH DAILY. 30 tablet 5  . nebivolol (BYSTOLIC) 10 MG tablet Take 10 mg by mouth daily.    . nitroGLYCERIN (NITROSTAT) 0.4 MG SL tablet  Place 1 tablet (0.4 mg total) under the tongue every 5 (five) minutes x 3 doses as needed for chest pain. 25 tablet 2  . pantoprazole (PROTONIX) 40 MG tablet TAKE 1 TABLET (40 MG TOTAL) BY MOUTH DAILY. 90 tablet 3  . tamsulosin (FLOMAX) 0.4 MG CAPS capsule Take 0.4 mg by mouth daily.  5  . hydrochlorothiazide (HYDRODIURIL) 25 MG tablet Take 1 tablet (25 mg total) by mouth daily. 90 tablet 3   No current facility-administered medications for this visit.     Allergies  Allergen Reactions  . Claritin [Loratadine] Other (See Comments)    Makes allergies worse-"a lot worse"  . Codeine Nausea And Vomiting  . Vicodin [Hydrocodone-Acetaminophen] Nausea And Vomiting    "pretty much any codeine"    Social History   Social History  . Marital status: Married    Spouse name: [name]  . Number of children: [name]  . Years of education: [name]   Occupational History  . Not on file.   Social History Main Topics  . Smoking status: Former Smoker    Packs/day: 1.50    Years: 33.00    Types: Cigarettes    Quit date: 04/15/2015  . Smokeless tobacco: Never Used  . Alcohol use 0.0 oz/week     Comment: 04/15/2015 "stopped drinking in ~ 11/2014"  . Drug use: Yes    Types: Marijuana, Cocaine     Comment: "in my teens"  . Sexual activity: Not on file   Other Topics Concern  . Not on file   Social History Narrative  . No narrative on file    Review of Systems: General: negative for chills, fever, night sweats or weight changes.  Cardiovascular: negative for chest pain, dyspnea on exertion, edema, orthopnea, palpitations, paroxysmal nocturnal dyspnea or shortness of breath Dermatological: negative for rash Respiratory: negative for cough or wheezing Urologic: negative for hematuria Abdominal: negative for nausea, vomiting, diarrhea, bright red blood per rectum, melena, or hematemesis Neurologic: negative for visual changes, syncope, or dizziness All other systems reviewed and are otherwise negative  except as noted above.  Blood pressure 132/72, pulse 88, height 6' (1.829 m), weight 255 lb (115.7 kg).  General appearance: alert, cooperative and no distress Neck: no carotid bruit and no JVD Lungs: clear to auscultation bilaterally Heart: regular rate and rhythm, S1, S2 normal, no murmur, click, rub or gallop Extremities: no LEE Pulses: 2+ and symmetric Skin: warm and dry Neurologic: Grossly normal  EKG not performed    ASSESSMENT AND PLAN:   1. CAD: s/p NSTEMI in 03/2014 treated with CABG x 4. He had a second NSTEMI 04/2014 with cath revealing a failed SVG to RI. His other 3 grafts were patent. EF normal. Medical therapy elected. ASA, Plavix, Imdur, Lipitor, Bystolic, Lisinopril and Amlodipine. Asymptomatic, no ischemic workup needed right now.  2. Post operative atrial fibrillation: occurred post CABG. no recurrent CP. On aspirin only.  3. HTN: Controlled on current regimen.   4. Hyperlipidemia:  All lipids at goal, we will recheck today and possibly decrease the dose of Lipitor to 40 milligrams daily.  5. Tobacco Abuse: patient reports that he quit smoking completely. He was congratulated on his efforts and instructed to continue to refrain from further use.   PLAN  F/u with in 6 months.   Tobias Alexander , MD 05/05/2016 8:54 AM

## 2016-05-05 NOTE — Patient Instructions (Signed)
Medication Instructions:   Your physician recommends that you continue on your current medications as directed. Please refer to the Current Medication list given to you today.    Labwork:  TODAY--CMET, CBC W DIFF, TSH, AND LIPIDS      Follow-Up:  Your physician wants you to follow-up in: 6 MONTHS WITH DR [name] You will receive a reminder letter in the mail two months in advance. If you don't receive a letter, please call our office to schedule the follow-up appointment.        If you need a refill on your cardiac medications before your next appointment, please call your pharmacy.   

## 2016-05-11 ENCOUNTER — Other Ambulatory Visit: Payer: Self-pay | Admitting: Cardiology

## 2016-07-01 IMAGING — CR DG CHEST 2V
2 series · 2 of 2 positions shown · non-contrast
Comparison: PA and lateral chest x-ray May 19, 2015

CLINICAL DATA: Status post CABG April 18, 2015, follow-up study,
no chest complaints, former smoker.

EXAM:
CHEST  2 VIEW

[w chest pa]
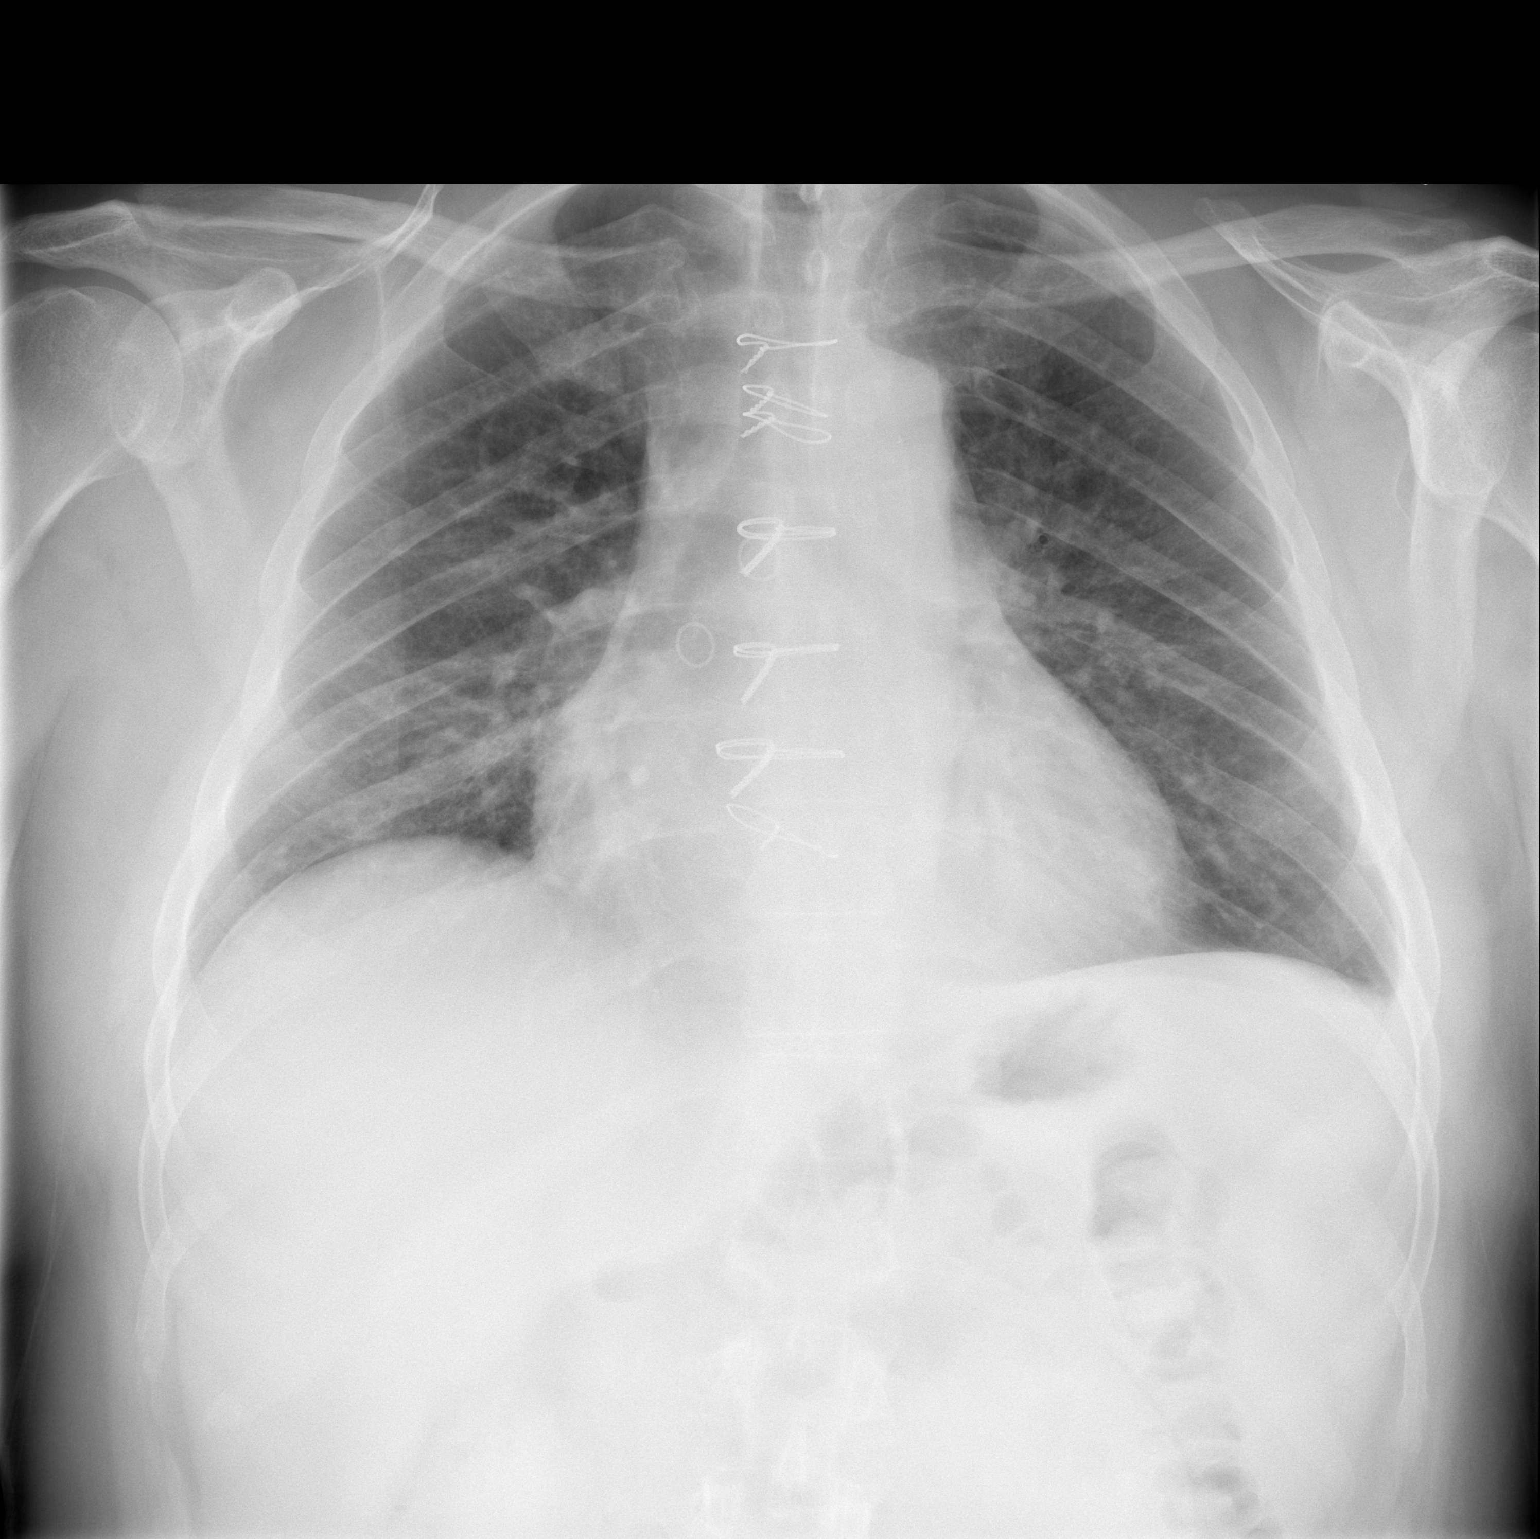

[w chest lat]
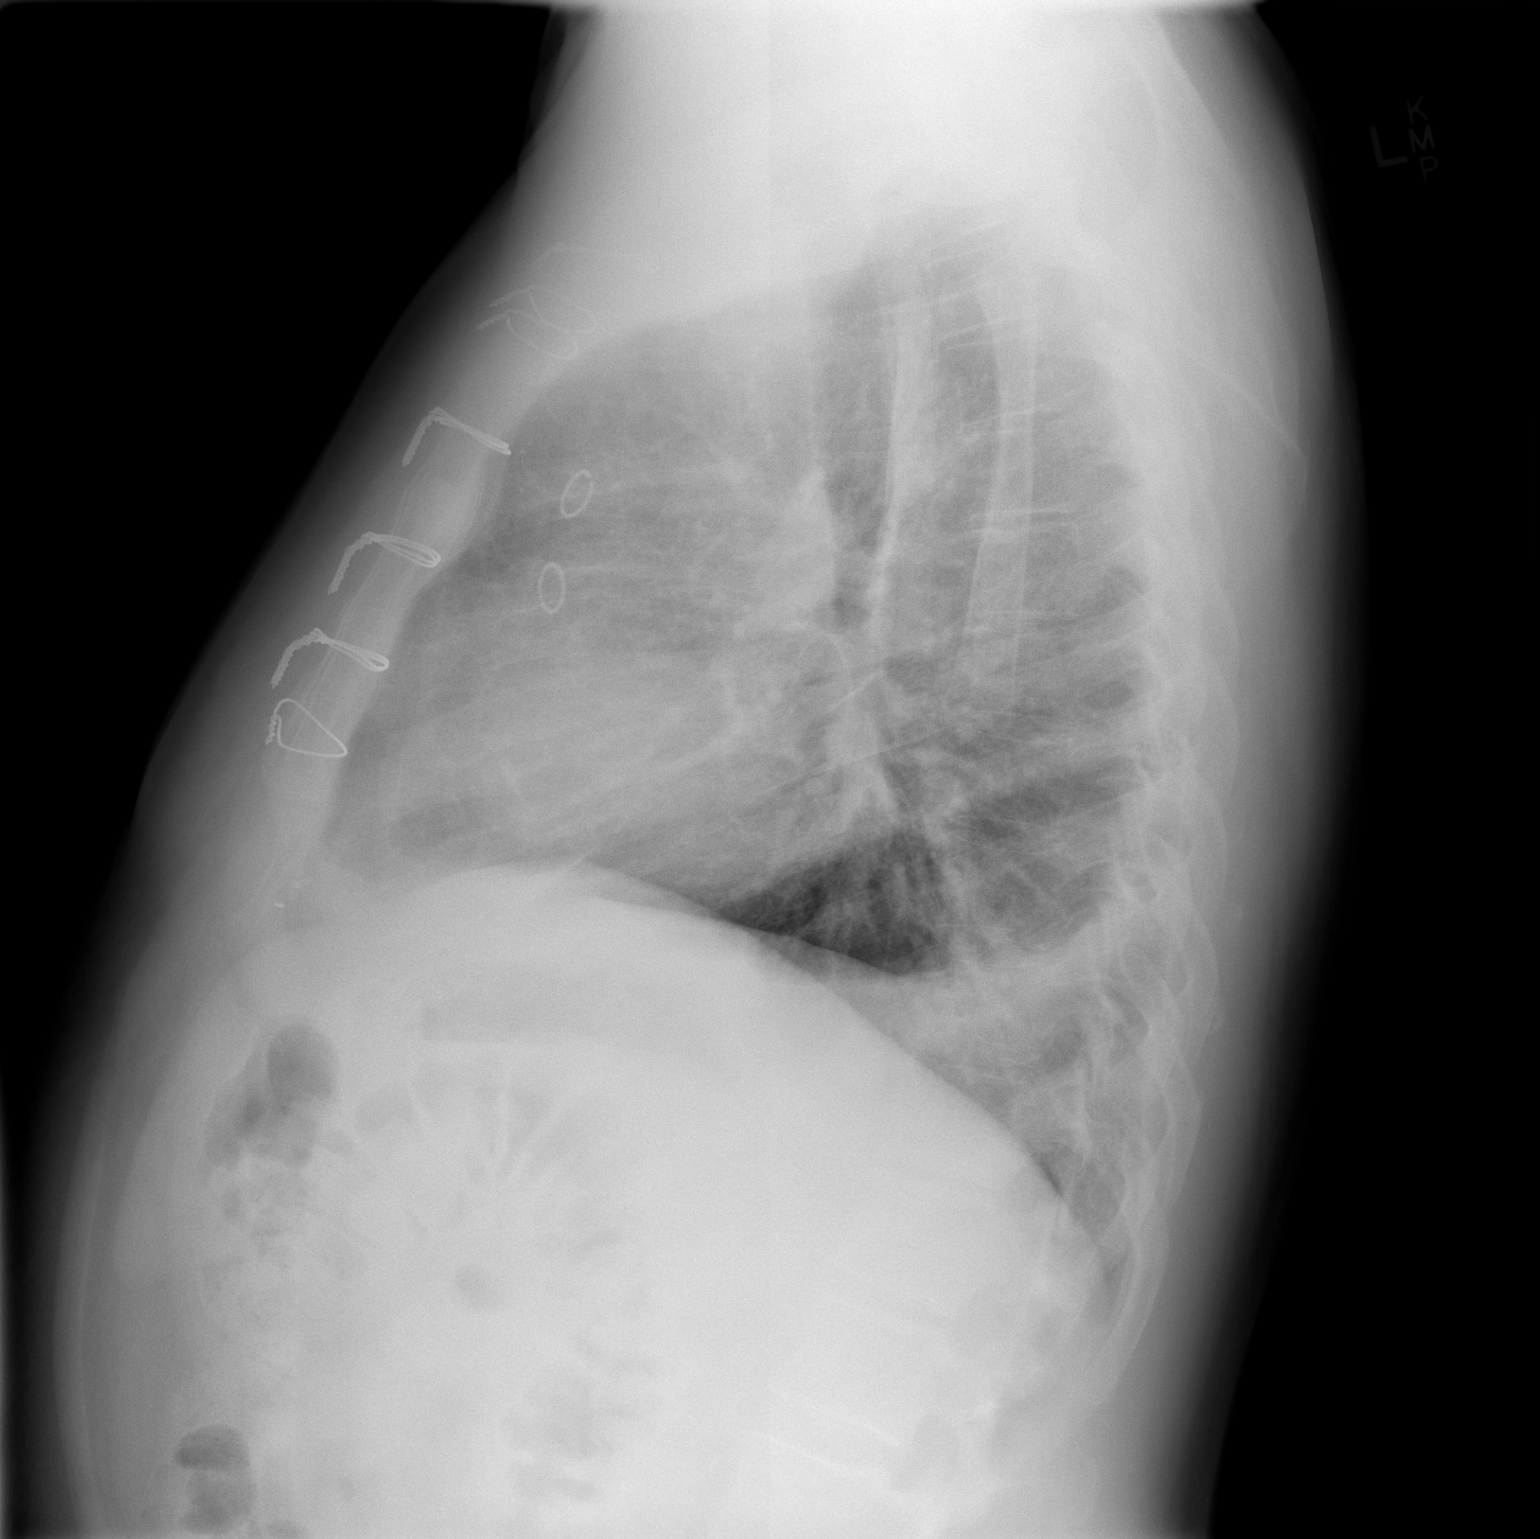

[2 of 2 positions shown; findings below may reference images not displayed]

FINDINGS: The lungs are adequately inflated. There is persistent increased
density in the retrocardiac region on the left consistent with
pleural thickening and/or residual atelectasis. The lung markings
are coarse in the perihilar regions but stable. The heart is mildly
enlarged but also stable. The pulmonary vascularity is normal. The
sternal wires are intact.
IMPRESSION: Postsurgical pleural thickening, localized pleural effusion, or
atelectasis in the retrocardiac region on the left little changed
from the previous study. There is stable mild cardiomegaly without
pulmonary edema.

## 2016-10-27 ENCOUNTER — Other Ambulatory Visit: Payer: Self-pay | Admitting: Cardiology

## 2016-11-01 ENCOUNTER — Telehealth: Payer: Self-pay | Admitting: *Deleted

## 2016-11-01 NOTE — Telephone Encounter (Signed)
Patient called and requested a refill on potassium however this is not listed on his current med list. He stated that he was not aware that he was supposed to stop taking it but it was removed from his med list at his office visit on 05/05/16 with a reason of completed course. Please advise. Thanks, MI

## 2016-11-01 NOTE — Telephone Encounter (Signed)
According to pt's records Potasium 10 meq was prescribed on 11/10/15 and discontinued on 05/05/16. Left pt a message to call back.

## 2016-11-02 MED ORDER — POTASSIUM CHLORIDE ER 10 MEQ PO TBCR: 10.0000 meq | EXTENDED_RELEASE_TABLET | Freq: Every day | ORAL | 1 refills | Status: DC

## 2016-11-02 NOTE — Telephone Encounter (Signed)
[name] is returning a call about his medication . Please call

## 2016-11-02 NOTE — Telephone Encounter (Signed)
Pt was never discontinued from K-DUR by Dr. Delton SeeNelson.  Appears that the CMA stopped this at the pts last OV with Dr Delton SeeNelson, when reconciling meds.  There are no discontinue orders from Dr Delton SeeNelson.  Pt should be taking K-Dur 10 mEq po daily.  Spoke with the pt and he states he was never discontinued from this med by Dr Delton SeeNelson, and will soon run out of refills.  Resent the pts KDUR 10 mEq po daily, as previously ordered by Dr Delton SeeNelson on 11/10/15.  Pt verbalized understanding and gracious for all the assistance provided.

## 2016-11-12 ENCOUNTER — Other Ambulatory Visit: Payer: Self-pay | Admitting: Cardiology

## 2016-11-30 ENCOUNTER — Other Ambulatory Visit: Payer: Self-pay | Admitting: Cardiology

## 2016-11-30 NOTE — Telephone Encounter (Signed)
Medication Detail    Disp Refills Start End   isosorbide mononitrate (IMDUR) 60 MG 24 hr tablet 90 tablet 3 05/05/2016    Sig - Route: Take 1 tablet (60 mg total) by mouth daily. - Oral   Sent to pharmacy as: isosorbide mononitrate (IMDUR) 60 MG 24 hr tablet   E-Prescribing Status: Receipt confirmed by pharmacy (05/05/2016 9:09 AM EST)   Associated Diagnoses   Essential (primary) hypertension - Primary     S/P CABG x 4     S/P CABG (coronary artery bypass graft)     Coronary artery disease due to lipid rich plaque     Encounter for long-term (current) use of high-risk medication     Hyperlipidemia, unspecified hyperlipidemia type     Pharmacy   [id number] - [place], [place] - [address]

## 2016-12-02 ENCOUNTER — Encounter: Payer: Self-pay | Admitting: Cardiology

## 2016-12-02 ENCOUNTER — Ambulatory Visit (INDEPENDENT_AMBULATORY_CARE_PROVIDER_SITE_OTHER): Payer: BLUE CROSS/BLUE SHIELD | Admitting: Cardiology

## 2016-12-02 VITALS — BP 132/80 | HR 60 | Ht 72.0 in | Wt 272.0 lb

## 2016-12-02 DIAGNOSIS — Z79899 Other long term (current) drug therapy: Secondary | ICD-10-CM | POA: Diagnosis not present

## 2016-12-02 DIAGNOSIS — I10 Essential (primary) hypertension: Secondary | ICD-10-CM

## 2016-12-02 DIAGNOSIS — I2583 Coronary atherosclerosis due to lipid rich plaque: Secondary | ICD-10-CM

## 2016-12-02 DIAGNOSIS — I2510 Atherosclerotic heart disease of native coronary artery without angina pectoris: Secondary | ICD-10-CM | POA: Diagnosis not present

## 2016-12-02 DIAGNOSIS — E785 Hyperlipidemia, unspecified: Secondary | ICD-10-CM

## 2016-12-02 DIAGNOSIS — Z951 Presence of aortocoronary bypass graft: Secondary | ICD-10-CM | POA: Diagnosis not present

## 2016-12-02 MED ORDER — ISOSORBIDE MONONITRATE ER 60 MG PO TB24: 60.0000 mg | ORAL_TABLET | Freq: Every day | ORAL | 3 refills | Status: DC

## 2016-12-02 MED ORDER — NITROGLYCERIN 0.4 MG SL SUBL: 0.4000 mg | SUBLINGUAL_TABLET | SUBLINGUAL | 2 refills | Status: DC | PRN

## 2016-12-02 NOTE — Progress Notes (Signed)
12/02/2016 [name]   [birth year]  [id number]  Primary Physician Jarrett SohoWharton, [name], PA-C Primary Cardiologist: Dr. Delton SeeNelson    Reason for Visit/CC: f/u for CAD   HPI:  51 y/o male, followed by Dr. Delton SeeNelson, with h/o CAD s/p CABG x 4 04/18/2015 by Dr. Tyrone SageGerhardt (LIMA to LAD, Left radial artery to OM, SVG to Intermediate, and SVG to PD), post operative atrial fibrillation managed with amiodarone, HTN, depression and anxiety. 1 month after his CABG, he was readmitted 04/2015 for recurrent CP and hypertensive urgency. Enzyme elevation was c/w NSTEMI. Subseuqnently, he underwent repeat [facility] which demonstrated occlusion of the SVG to the ramus intermediate. The native vessel does have moderate ostial stenosis but has TIMI 3 flow without other obstructive disease. It is possible that with occlusion of the SVG there may have been some thrombus showered downstream. His remaining 3 grafts were patent. Medical management was recommended. He was continued on ASA, a statin, BB and ARB. Plavix and a long acting nitrate were added to his regimen. He was also prescribed PRN SL NTG. He did well with medical therapy.  He presents back for routine f/u. He has done well. He denies any anginal symptoms. No chest pain or dyspnea. He denies exertional symptoms. He mows his lawn with a push mower w/o limitations. He notes full med compliance. BP is well controlled. Resting HR in the 60s.   Current Meds  Medication Sig  . alfuzosin (UROXATRAL) 10 MG 24 hr tablet Take 10 mg by mouth daily.   Marland Kitchen. amLODipine (NORVASC) 10 MG tablet Take 1 tablet (10 mg total) by mouth daily.  Marland Kitchen. aspirin EC 81 MG EC tablet Take 1 tablet (81 mg total) by mouth daily.  Marland Kitchen. atorvastatin (LIPITOR) 80 MG tablet TAKE 1 TABLET EVERY DAY AT 6PM  . clopidogrel (PLAVIX) 75 MG tablet Take 1 tablet (75 mg total) by mouth daily.  Marland Kitchen. FLUoxetine (PROZAC) 20 MG capsule Take 30 mg by mouth daily.   . hydrochlorothiazide (HYDRODIURIL) 25 MG tablet Take 1 tablet  (25 mg total) by mouth daily.  . isosorbide mononitrate (IMDUR) 60 MG 24 hr tablet Take 1 tablet (60 mg total) by mouth daily.  Marland Kitchen. lisinopril (PRINIVIL,ZESTRIL) 40 MG tablet Take 1 tablet (40 mg total) by mouth daily.  . nebivolol (BYSTOLIC) 10 MG tablet Take 1 tablet (10 mg total) by mouth daily.  . nitroGLYCERIN (NITROSTAT) 0.4 MG SL tablet Place 1 tablet (0.4 mg total) under the tongue every 5 (five) minutes x 3 doses as needed for chest pain.  . pantoprazole (PROTONIX) 40 MG tablet TAKE 1 TABLET BY MOUTH EVERY DAY  . potassium chloride (K-DUR) 10 MEQ tablet Take 1 tablet (10 mEq total) by mouth daily.   Allergies  Allergen Reactions  . Claritin [Loratadine] Other (See Comments)    Makes allergies worse-"a lot worse"  . Codeine Nausea And Vomiting  . Vicodin [Hydrocodone-Acetaminophen] Nausea And Vomiting    "pretty much any codeine"   Past Medical History:  Diagnosis Date  . Anxiety   . Depression   . Hyperlipidemia   . Hypertension   . Lumbar herniated disc dx'd 03/2015  . NSTEMI (non-ST elevated myocardial infarction) (HCC) 04/15/2015   Family History  Problem Relation Age of Onset  . Heart attack Father 1651       Deceased at this age  . Cancer Father        Lung  . Diabetes Sister    Past Surgical History:  Procedure Laterality Date  .  BACK SURGERY    . CARDIAC CATHETERIZATION  04/15/2015  . CARDIAC CATHETERIZATION [name] 04/15/2015   Procedure: Left Heart Cath and Coronary Angiography;  Surgeon: Runell Gess, MD;  Location[facility] INVASIVE CV LAB;  Service: Cardiovascular;  Laterality: [name];  . CARDIAC CATHETERIZATION [name] 05/20/2015   Procedure: Left Heart Cath and Cors/Grafts Angiography;  Surgeon: Peter M Swaziland, MD;  Location: [facility] INVASIVE CV LAB;  Service: Cardiovascular;  Laterality: [name];  . CORONARY ARTERY BYPASS GRAFT [name] 04/18/2015   Procedure: CORONARY ARTERY BYPASS GRAFTING times four with LIMA  to LAD, LEFT RADIAL ARTERY to OM, Right saphenous vein harvesting  andutilized to Interm and to PD ENDOSCOPIC GREATER SAPHENOUS VEIN HARVEST (EVH) RIGHT THIGH;  Surgeon: Delight Ovens, MD;  Location: [facility] OR;  Service: Open Heart Surgery;  Laterality: [name];  . LUMBAR LAMINECTOMY  2002  . RADIAL ARTERY HARVEST Left 04/18/2015   Procedure:  LEFT Arm RADIAL ARTERY HARVEST;  Surgeon: Delight Ovens, MD;  Location: [facility] OR;  Service: Open Heart Surgery;  Laterality: Left;  . TEE WITHOUT CARDIOVERSION [name] 04/18/2015   Procedure: TRANSESOPHAGEAL ECHOCARDIOGRAM (TEE);  Surgeon: Delight Ovens, MD;  Location: [facility] OR;  Service: Open Heart Surgery;  Laterality: [name];  . TONSILLECTOMY  1960s   Social History   Social History  . Marital status: Married    Spouse name: [name]  . Number of children: [name]  . Years of education: [name]   Occupational History  . Not on file.   Social History Main Topics  . Smoking status: Former Smoker    Packs/day: 1.50    Years: 33.00    Types: Cigarettes    Quit date: 04/15/2015  . Smokeless tobacco: Never Used  . Alcohol use 0.0 oz/week     Comment: 04/15/2015 "stopped drinking in ~ 11/2014"  . Drug use: Yes    Types: Marijuana, Cocaine     Comment: "in my teens"  . Sexual activity: Not on file   Other Topics Concern  . Not on file   Social History Narrative  . No narrative on file     Review of Systems: General: negative for chills, fever, night sweats or weight changes.  Cardiovascular: negative for chest pain, dyspnea on exertion, edema, orthopnea, palpitations, paroxysmal nocturnal dyspnea or shortness of breath Dermatological: negative for rash Respiratory: negative for cough or wheezing Urologic: negative for hematuria Abdominal: negative for nausea, vomiting, diarrhea, bright red blood per rectum, melena, or hematemesis Neurologic: negative for visual changes, syncope, or dizziness All other systems reviewed and are otherwise negative except as noted above.   Physical Exam:  Blood pressure 132/80, pulse 60,  height 6' (1.829 m), weight 272 lb (123.4 kg), SpO2 93 %.  General appearance: alert, cooperative and no distress Neck: no carotid bruit and no JVD Lungs: clear to auscultation bilaterally Heart: regular rate and rhythm, S1, S2 normal, no murmur, click, rub or gallop Extremities: extremities normal, atraumatic, no cyanosis or edema Pulses: 2+ and symmetric Skin: Skin color, texture, turgor normal. No rashes or lesions Neurologic: Grossly normal  EKG not preformed -- personally reviewed   ASSESSMENT AND PLAN:   1. CAD: s/p NSTEMI in 03/2014 treated with CABG x 4. He had a second NSTEMI 04/2014 with cath revealing a failed SVG to RI. His other 3 grafts were patent. EF normal. Medical therapy elected. ASA, Plavix, Imdur, Lipitor, Bystolic, Lisinopril and Amlodipine. He has done well with this. No anginal symptoms. BP and HR well controlled. Continue current regimen.  2. Post operative atrial fibrillation: occurred post CABG and managed with short term amio. He denies any symptoms of recurrent afib. RRR on exam today. Resting HR is well controlled in the 60s on BB.   3. HTN: Controlled on current regimen. No changes made.   4. Hyperlipidemia: Last lipid panel 04/2016 showed LDL at 92 mg/dL. Lipitor was increased from 40>>80 mg. We will recheck FLP and HFTs.   5. Tobacco Abuse: Pt stopped but has returned to smoking. Smoking cessation was advised.   Follow-Up w/ Dr. Delton See in 6 months.   [name] Delmer Islam, MHS [facility] 12/02/2016 2:32 PM

## 2016-12-02 NOTE — Patient Instructions (Signed)
Medication Instructions:  1. Your physician recommends that you continue on your current medications as directed. Please refer to the Current Medication list given to you today.   Labwork: Tuesday 12/07/16 FASTING LIPID AND LIVER PANEL TO BE DONE; LAB OPENS AT 7:30 AM  Testing/Procedures: NONE ORDERED TODAY  Follow-Up: Your physician wants you to follow-up in: 3 MONTHS DR. Johnell ComingsNELSON  You will receive a reminder letter in the mail two months in advance. If you don't receive a letter, please call our office to schedule the follow-up appointment.   Any Other Special Instructions Will Be Listed Below (If Applicable).     If you need a refill on your cardiac medications before your next appointment, please call your pharmacy.

## 2016-12-07 ENCOUNTER — Other Ambulatory Visit: Payer: BLUE CROSS/BLUE SHIELD

## 2016-12-07 DIAGNOSIS — I2510 Atherosclerotic heart disease of native coronary artery without angina pectoris: Secondary | ICD-10-CM

## 2016-12-07 LAB — HEPATIC FUNCTION PANEL
ALBUMIN: 4.5 g/dL (ref 3.5–5.5)
ALT: 19 IU/L (ref 0–44)
AST: 20 IU/L (ref 0–40)
Alkaline Phosphatase: 89 IU/L (ref 39–117)
BILIRUBIN TOTAL: 0.5 mg/dL (ref 0.0–1.2)
BILIRUBIN, DIRECT: 0.12 mg/dL (ref 0.00–0.40)
TOTAL PROTEIN: 7.3 g/dL (ref 6.0–8.5)

## 2016-12-07 LAB — LIPID PANEL
CHOL/HDL RATIO: 7.9 ratio — AB (ref 0.0–5.0)
Cholesterol, Total: 245 mg/dL — ABNORMAL HIGH (ref 100–199)
HDL: 31 mg/dL — AB (ref >39)
LDL CALC: 148 mg/dL — AB (ref 0–99)
Triglycerides: 329 mg/dL — ABNORMAL HIGH (ref 0–149)
VLDL CHOLESTEROL CAL: 66 mg/dL — AB (ref 5–40)

## 2016-12-08 ENCOUNTER — Telehealth: Payer: Self-pay | Admitting: Cardiology

## 2016-12-08 NOTE — Telephone Encounter (Signed)
This has already been addressed in the lab result note.

## 2016-12-08 NOTE — Telephone Encounter (Signed)
Follow Up: ° ° °Returning your call,concerning his lab results. °

## 2016-12-08 NOTE — Telephone Encounter (Signed)
Returned pts wife, Fannie Knee, [place] on file call. We discussed pts lab results. See result note.

## 2016-12-08 NOTE — Telephone Encounter (Signed)
New message    Pt stopped taking his cholesterol medication back in April of this year, due to having muscle cramps, he tried to different kinds and had the same reaction , the last one he took was atorvastatin

## 2016-12-09 ENCOUNTER — Telehealth: Payer: Self-pay | Admitting: *Deleted

## 2016-12-09 DIAGNOSIS — Z79899 Other long term (current) drug therapy: Secondary | ICD-10-CM

## 2016-12-09 MED ORDER — ATORVASTATIN CALCIUM 40 MG PO TABS: 40.0000 mg | ORAL_TABLET | Freq: Every day | ORAL | 3 refills | Status: DC

## 2016-12-09 NOTE — Telephone Encounter (Signed)
-----   Message from Abelino Derrick, [place] sent at 12/08/2016  5:21 PM EDT ----- Have pt try lipitor 40 mg  LUKE KILROY PA-C 12/08/2016 5:21 PM

## 2017-01-31 ENCOUNTER — Encounter (INDEPENDENT_AMBULATORY_CARE_PROVIDER_SITE_OTHER): Payer: Self-pay

## 2017-01-31 ENCOUNTER — Other Ambulatory Visit: Payer: BLUE CROSS/BLUE SHIELD | Admitting: *Deleted

## 2017-01-31 DIAGNOSIS — Z79899 Other long term (current) drug therapy: Secondary | ICD-10-CM

## 2017-01-31 LAB — HEPATIC FUNCTION PANEL
ALT: 21 IU/L (ref 0–44)
AST: 14 IU/L (ref 0–40)
Albumin: 4.2 g/dL (ref 3.5–5.5)
Alkaline Phosphatase: 103 IU/L (ref 39–117)
BILIRUBIN, DIRECT: 0.12 mg/dL (ref 0.00–0.40)
Bilirubin Total: 0.5 mg/dL (ref 0.0–1.2)
TOTAL PROTEIN: 6.8 g/dL (ref 6.0–8.5)

## 2017-01-31 LAB — LIPID PANEL
CHOL/HDL RATIO: 5.1 ratio — AB (ref 0.0–5.0)
Cholesterol, Total: 158 mg/dL (ref 100–199)
HDL: 31 mg/dL — AB (ref >39)
LDL Calculated: 88 mg/dL (ref 0–99)
Triglycerides: 193 mg/dL — ABNORMAL HIGH (ref 0–149)
VLDL CHOLESTEROL CAL: 39 mg/dL (ref 5–40)

## 2017-02-23 ENCOUNTER — Telehealth: Payer: Self-pay

## 2017-02-23 DIAGNOSIS — E785 Hyperlipidemia, unspecified: Secondary | ICD-10-CM

## 2017-02-23 MED ORDER — EZETIMIBE 10 MG PO TABS: 10.0000 mg | ORAL_TABLET | Freq: Every day | ORAL | 3 refills | Status: DC

## 2017-02-23 NOTE — Telephone Encounter (Signed)
-----   Message from Allayne ButcherBrittainy M Simmons, [place] sent at 02/23/2017  4:52 PM EST ----- Can try adding Zetia 10 mg daily. Repeat lipids and HFTs in 8 weeks. If unable to tolerate, place referral to lipid clinic for PCSK 9 inhibitor therapy.

## 2017-02-23 NOTE — Telephone Encounter (Signed)
Reviewed results with patient, he is instructed to start Zetia 10 mg once a day, and patient is scheduled for repeat lipids and liver test on 04/26/16. Patient verbalized understanding and thanked me for the call.   Notes recorded by Allayne ButcherSimmons, Brittainy M, PA-C on 02/23/2017 at 4:52 PM EST Can try adding Zetia 10 mg daily. Repeat lipids and HFTs in 8 weeks. If unable to tolerate, place referral to lipid clinic for PCSK 9 inhibitor therapy.

## 2017-03-04 ENCOUNTER — Other Ambulatory Visit: Payer: Self-pay | Admitting: Cardiology

## 2017-03-04 DIAGNOSIS — I10 Essential (primary) hypertension: Secondary | ICD-10-CM

## 2017-03-04 DIAGNOSIS — I2583 Coronary atherosclerosis due to lipid rich plaque: Secondary | ICD-10-CM

## 2017-03-04 DIAGNOSIS — Z79899 Other long term (current) drug therapy: Secondary | ICD-10-CM

## 2017-03-04 DIAGNOSIS — I2510 Atherosclerotic heart disease of native coronary artery without angina pectoris: Secondary | ICD-10-CM

## 2017-03-04 DIAGNOSIS — E785 Hyperlipidemia, unspecified: Secondary | ICD-10-CM

## 2017-03-04 DIAGNOSIS — Z951 Presence of aortocoronary bypass graft: Secondary | ICD-10-CM

## 2017-03-09 ENCOUNTER — Encounter: Payer: Self-pay | Admitting: Cardiology

## 2017-03-09 ENCOUNTER — Ambulatory Visit: Payer: BLUE CROSS/BLUE SHIELD | Admitting: Cardiology

## 2017-03-09 VITALS — BP 138/82 | HR 59 | Resp 16 | Ht 71.0 in | Wt 262.2 lb

## 2017-03-09 DIAGNOSIS — E785 Hyperlipidemia, unspecified: Secondary | ICD-10-CM | POA: Diagnosis not present

## 2017-03-09 DIAGNOSIS — I214 Non-ST elevation (NSTEMI) myocardial infarction: Secondary | ICD-10-CM | POA: Diagnosis not present

## 2017-03-09 DIAGNOSIS — Z951 Presence of aortocoronary bypass graft: Secondary | ICD-10-CM

## 2017-03-09 DIAGNOSIS — I2510 Atherosclerotic heart disease of native coronary artery without angina pectoris: Secondary | ICD-10-CM

## 2017-03-09 DIAGNOSIS — E782 Mixed hyperlipidemia: Secondary | ICD-10-CM | POA: Diagnosis not present

## 2017-03-09 DIAGNOSIS — I480 Paroxysmal atrial fibrillation: Secondary | ICD-10-CM | POA: Diagnosis not present

## 2017-03-09 DIAGNOSIS — I10 Essential (primary) hypertension: Secondary | ICD-10-CM

## 2017-03-09 MED ORDER — FISH OIL 1000 MG PO CAPS: 1000.0000 mg | ORAL_CAPSULE | Freq: Every day | ORAL | 0 refills | Status: DC

## 2017-03-09 NOTE — Progress Notes (Signed)
03/09/2017 [name]   05/22/1965  [id number]  Primary Physician [name], [name], PA-C Primary Cardiologist: Dr. Delton Griffin    Reason for Visit/CC: f/u for CAD   HPI:  51 y/o male, followed by Dr. Delton Griffin, with h/o CAD s/p CABG x 4 04/18/2015 by Dr. Tyrone Griffin (LIMA to LAD, Left radial artery to OM, SVG to Intermediate, and SVG to PD), post operative atrial fibrillation managed with amiodarone, HTN, depression and anxiety. 1 month after his CABG, he was readmitted 04/2015 for recurrent CP and hypertensive urgency. Enzyme elevation was c/w NSTEMI. Subse[facility]qnently, [name] occlusion of the SVG to the ramus intermediate. The native vessel does have moderate ostial stenosis but has TIMI 3 flow without other obstructive disease. It is possible that with occlusion of the SVG there may have been some thrombus showered downstream. His remaining 3 grafts were patent. Medical management was recommended. He was continued on ASA, a statin, BB and ARB. Plavix and a long acting nitrate were added to his regimen. He was also prescribed PRN SL NTG. He did well with medical therapy.  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 No lower extremity edema orthopnea Griffin proximal nocturnal dyspnea. He had blood work done in November with LDL of 88 triglycerides 193, Zetia was added to his regimen he is scheduled to repeat his labs in February again. He's been compliant with his medication and has no side effects.  Current Meds  Medication Sig  . alfuzosin (UROXATRAL) 10 MG 24 hr tablet Take 10 mg by mouth daily.   Marland Kitchen. amLODipine (NORVASC) 10 MG tablet Take 1 tablet (10 mg total) by mouth daily.  Marland Kitchen. aspirin EC 81 MG EC tablet Take 1 tablet (81 mg total) by mouth daily.  Marland Kitchen. atorvastatin (LIPITOR) 40 MG tablet Take 1 tablet (40 mg total) by  mouth daily.  . clopidogrel (PLAVIX) 75 MG tablet Take 1 tablet (75 mg total) by mouth daily.  Marland Kitchen. ezetimibe (ZETIA) 10 MG tablet Take 1 tablet (10 mg total) by mouth daily.  Marland Kitchen. FLUoxetine (PROZAC) 20 MG capsule Take 30 mg by mouth daily.   . hydrochlorothiazide (HYDRODIURIL) 25 MG tablet Take 1 tablet (25 mg total) by mouth daily.  . isosorbide mononitrate (IMDUR) 60 MG 24 hr tablet Take 1 tablet (60 mg total) by mouth daily.  Marland Kitchen. lisinopril (PRINIVIL,ZESTRIL) 40 MG tablet TAKE 1 TABLET (40 MG TOTAL) BY MOUTH DAILY.  . nebivolol (BYSTOLIC) 10 MG tablet Take 1 tablet (10 mg total) by mouth daily.  . nitroGLYCERIN (NITROSTAT) 0.4 MG SL tablet Place 1 tablet (0.4 mg total) under the tongue every 5 (five) minutes x 3 doses as needed for chest pain.  . pantoprazole (PROTONIX) 40 MG tablet TAKE 1 TABLET BY MOUTH EVERY DAY  . potassium chloride (K-DUR) 10 MEQ tablet Take 1 tablet (10 mEq total) by mouth daily.   Allergies  Allergen Reactions  . Atorvastatin Other (Griffin Comments)    80 mg dose legs cramps, tolerates 40 mg dose  . Claritin [Loratadine] Other (Griffin Comments)    Makes allergies worse-"a lot worse"  . Codeine Nausea And Vomiting  . Vicodin [Hydrocodone-Acetaminophen] Nausea And Vomiting    "pretty much any codeine"   Past Medical History:  Diagnosis Date  . Anxiety   . Depression   . Hyperlipidemia   . Hypertension   .  Lumbar herniated disc dx'd 03/2015  . NSTEMI (non-ST elevated myocardial infarction) (HCC) 04/15/2015   Family History  Problem Relation Age of Onset  . Heart attack Father 4051       Deceased at this age  . Cancer Father        Lung  . Diabetes Sister    Past Surgical History:  Procedure Laterality Date  . BACK SURGERY    . CARDIAC CATHETERIZATION  04/15/2015  . CARDIAC CATHETERIZATION [name] 04/15/2015   Procedure: Left Heart Cath and Coronary Angiography;  Surgeon: Jeffery GessJonathan J Berry, MD;  Location: [name];  Service: Cardiovascular;  Laterality: [name];  .  CARDIAC CATHETERIZATION [name] 05/20/2015   Procedure: Left Heart Cath and Cors/Grafts Angiography;  Surgeon: Jeffery M SwazilandJordan, MD;  Location: [name];  Service: Cardiovascular;  Laterality: [name];  . CORONARY ARTERY BYPASS GRAFT [name] 04/18/2015   Procedure: CORONARY ARTERY BYPASS GRAFTING times four with LIMA  to LAD, LEFT RADIAL ARTERY to OM, Right saphenous vein harvesting andutilized to Interm and to PD ENDOSCOPIC GREATER SAPHENOUS VEIN HARVEST (EVH) RIGHT THIGH;  Surgeon: Jeffery OvensEdward Griffin Gerhardt, MD;  Location: [name];  Service: Open Heart Surgery;  Laterality: [name];  . LUMBAR LAMINECTOMY  2002  . RADIAL ARTERY HARVEST Left 04/18/2015   Procedure:  LEFT Arm RADIAL ARTERY HARVEST;  Surgeon: Jeffery OvensEdward Griffin Gerhardt, MD;  Location: [name];  Service: Open Heart Surgery;  Laterality: Left;  . TEE WITHOUT CARDIOVERSION [name] 04/18/2015   Procedure: TRANSESOPHAGEAL ECHOCARDIOGRAM (TEE);  Surgeon: Jeffery OvensEdward Griffin Gerhardt, MD;  Location: [name];  Service: Open Heart Surgery;  Laterality: [name];  . TONSILLECTOMY  1960s   Social History   Socioeconomic History  . Marital status: Married    Spouse name: Not on file  . Number of children: Not on file  . Years of education: Not on file  . Highest education level: Not on file  Social Needs  . Financial resource strain: Not on file  . Food insecurity - worry: Not on file  . Food insecurity - inability: Not on file  . Transportation needs - medical: Not on file  . Transportation needs - non-medical: Not on file  Occupational History  . Not on file  Tobacco Use  . Smoking status: Former Smoker    Packs/day: 1.50    Years: 33.00    Pack years: 49.50    Types: Cigarettes    Last attempt to quit: 04/15/2015    Years since quitting: 1.9  . Smokeless tobacco: Never Used  Substance and Sexual Activity  . Alcohol use: Yes    Alcohol/week: 0.0 oz    Comment: 04/15/2015 "stopped drinking in ~ 11/2014"  . Drug use: Yes    Types: Marijuana, Cocaine    Comment: "in my teens"    . Sexual activity: Not on file  Other Topics Concern  . Not on file  Social History Narrative  . Not on file     Review of Systems: General: negative for chills, fever, night sweats Griffin weight changes.  Cardiovascular: negative for chest pain, dyspnea on exertion, edema, orthopnea, palpitations, paroxysmal nocturnal dyspnea Griffin shortness of breath Dermatological: negative for rash Respiratory: negative for cough Griffin wheezing Urologic: negative for hematuria Abdominal: negative for nausea, vomiting, diarrhea, bright red blood per rectum, melena, Griffin hematemesis Neurologic: negative for visual changes, syncope, Griffin dizziness All other systems reviewed and are otherwise negative except as noted above.   Physical Exam:  Blood pressure 138/82, pulse Marland Kitchen(!)  59, resp. rate 16, height 5\' 11"  (1.803 m), weight 262 lb 3.2 oz (118.9 kg), SpO2 97 %.  General appearance: alert, cooperative and no distress Neck: no carotid bruit and no JVD Lungs: clear to auscultation bilaterally Heart: regular rate and rhythm, S1, S2 normal, no murmur, click, rub Griffin gallop Extremities: extremities normal, atraumatic, no cyanosis Griffin edema Pulses: 2+ and symmetric Skin: Skin color, texture, turgor normal. No rashes Griffin lesions Neurologic: Grossly normal  EKG: Was performed today 03/09/2017 showed sinus bradycardia otherwise normal EKG and unchanged from prior.    ASSESSMENT AND PLAN:   1. CAD: s/p NSTEMI in 03/2014 treated with CABG x 4. He had a second NSTEMI 04/2014 with cath revealing a failed SVG to RI. His other 3 grafts were patent. EF normal. Medical therapy elected. ASA, Plavix, Imdur, Lipitor, Bystolic, Lisinopril and Amlodipine. The patient is asymptomatic, he is advised to start regular exercise with either walking Griffin treadmill Griffin stationary bike 30 minutes 5 times a week.  2. Post operative atrial fibrillation: occurred post CABG and managed with short term amio. He denies any symptoms of recurrent afib. RRR  on exam today. Resting HR is well controlled in the 60s on BB.   3. HTN: Controlled on current regimen. No changes made.   4. Hyperlipidemia: Last lipid panel in November 2018 showed LDL 88, triglycerides 193, Zetia was added to his regimen, he is advised to start using fish oil, he prefers over-the-counter. His lipids will be rechecked in February we will consider adding fenofibrate at a time if history glycerides are still elevated.  5. Tobacco Abuse: Pt stopped but has returned to smoking. Smoking cessation was advised.   Follow-Up w/ Dr. Delton Griffin in 3 months.   Tobias Alexander , MD [facility] 03/09/2017 9:10 AM

## 2017-03-09 NOTE — Patient Instructions (Signed)
Medication Instructions:   START TAKING FISH OIL 1000 MG ONCE DAILY     Follow-Up:  3 MONTHS WITH DR Delton SeeNELSON       If you need a refill on your cardiac medications before your next appointment, please call your pharmacy.

## 2017-03-09 NOTE — Addendum Note (Signed)
Addended by: Loa SocksMARTIN, IVY M on: 03/09/2017 09:42 AM   Modules accepted: Orders

## 2017-04-24 ENCOUNTER — Other Ambulatory Visit: Payer: Self-pay | Admitting: Cardiology

## 2017-04-24 DIAGNOSIS — Z79899 Other long term (current) drug therapy: Secondary | ICD-10-CM

## 2017-04-24 DIAGNOSIS — I2510 Atherosclerotic heart disease of native coronary artery without angina pectoris: Secondary | ICD-10-CM

## 2017-04-24 DIAGNOSIS — Z951 Presence of aortocoronary bypass graft: Secondary | ICD-10-CM

## 2017-04-24 DIAGNOSIS — I10 Essential (primary) hypertension: Secondary | ICD-10-CM

## 2017-04-24 DIAGNOSIS — I2583 Coronary atherosclerosis due to lipid rich plaque: Secondary | ICD-10-CM

## 2017-04-24 DIAGNOSIS — E785 Hyperlipidemia, unspecified: Secondary | ICD-10-CM

## 2017-04-26 ENCOUNTER — Other Ambulatory Visit: Payer: BLUE CROSS/BLUE SHIELD

## 2017-04-26 DIAGNOSIS — E785 Hyperlipidemia, unspecified: Secondary | ICD-10-CM

## 2017-04-26 LAB — HEPATIC FUNCTION PANEL
ALT: 23 IU/L (ref 0–44)
AST: 17 IU/L (ref 0–40)
Albumin: 4.2 g/dL (ref 3.5–5.5)
Alkaline Phosphatase: 101 IU/L (ref 39–117)
BILIRUBIN TOTAL: 0.5 mg/dL (ref 0.0–1.2)
BILIRUBIN, DIRECT: 0.14 mg/dL (ref 0.00–0.40)
TOTAL PROTEIN: 7 g/dL (ref 6.0–8.5)

## 2017-04-26 LAB — LIPID PANEL
CHOL/HDL RATIO: 4.2 ratio (ref 0.0–5.0)
Cholesterol, Total: 137 mg/dL (ref 100–199)
HDL: 33 mg/dL — ABNORMAL LOW (ref >39)
LDL Calculated: 61 mg/dL (ref 0–99)
Triglycerides: 214 mg/dL — ABNORMAL HIGH (ref 0–149)
VLDL Cholesterol Cal: 43 mg/dL — ABNORMAL HIGH (ref 5–40)

## 2017-05-11 ENCOUNTER — Other Ambulatory Visit: Payer: Self-pay | Admitting: Cardiology

## 2017-05-11 DIAGNOSIS — I2583 Coronary atherosclerosis due to lipid rich plaque: Secondary | ICD-10-CM

## 2017-05-11 DIAGNOSIS — I10 Essential (primary) hypertension: Secondary | ICD-10-CM

## 2017-05-11 DIAGNOSIS — I2510 Atherosclerotic heart disease of native coronary artery without angina pectoris: Secondary | ICD-10-CM

## 2017-05-11 DIAGNOSIS — Z79899 Other long term (current) drug therapy: Secondary | ICD-10-CM

## 2017-05-11 DIAGNOSIS — Z951 Presence of aortocoronary bypass graft: Secondary | ICD-10-CM

## 2017-05-11 DIAGNOSIS — E785 Hyperlipidemia, unspecified: Secondary | ICD-10-CM

## 2017-05-12 ENCOUNTER — Other Ambulatory Visit: Payer: Self-pay | Admitting: Cardiology

## 2017-05-12 DIAGNOSIS — Z951 Presence of aortocoronary bypass graft: Secondary | ICD-10-CM

## 2017-05-12 DIAGNOSIS — I2510 Atherosclerotic heart disease of native coronary artery without angina pectoris: Secondary | ICD-10-CM

## 2017-05-12 DIAGNOSIS — Z79899 Other long term (current) drug therapy: Secondary | ICD-10-CM

## 2017-05-12 DIAGNOSIS — E785 Hyperlipidemia, unspecified: Secondary | ICD-10-CM

## 2017-05-12 DIAGNOSIS — I10 Essential (primary) hypertension: Secondary | ICD-10-CM

## 2017-05-12 DIAGNOSIS — I2583 Coronary atherosclerosis due to lipid rich plaque: Secondary | ICD-10-CM

## 2017-06-03 ENCOUNTER — Encounter: Payer: Self-pay | Admitting: Cardiology

## 2017-06-04 ENCOUNTER — Other Ambulatory Visit: Payer: Self-pay | Admitting: Cardiology

## 2017-06-04 DIAGNOSIS — I10 Essential (primary) hypertension: Secondary | ICD-10-CM

## 2017-06-04 DIAGNOSIS — I2510 Atherosclerotic heart disease of native coronary artery without angina pectoris: Secondary | ICD-10-CM

## 2017-06-04 DIAGNOSIS — Z951 Presence of aortocoronary bypass graft: Secondary | ICD-10-CM

## 2017-06-04 DIAGNOSIS — E785 Hyperlipidemia, unspecified: Secondary | ICD-10-CM

## 2017-06-04 DIAGNOSIS — I2583 Coronary atherosclerosis due to lipid rich plaque: Secondary | ICD-10-CM

## 2017-06-04 DIAGNOSIS — Z79899 Other long term (current) drug therapy: Secondary | ICD-10-CM

## 2017-06-13 ENCOUNTER — Ambulatory Visit: Payer: BLUE CROSS/BLUE SHIELD | Admitting: Cardiology

## 2017-06-13 ENCOUNTER — Encounter: Payer: Self-pay | Admitting: Cardiology

## 2017-06-13 DIAGNOSIS — E785 Hyperlipidemia, unspecified: Secondary | ICD-10-CM

## 2017-06-13 DIAGNOSIS — Z79899 Other long term (current) drug therapy: Secondary | ICD-10-CM | POA: Diagnosis not present

## 2017-06-13 DIAGNOSIS — Z951 Presence of aortocoronary bypass graft: Secondary | ICD-10-CM

## 2017-06-13 DIAGNOSIS — I2510 Atherosclerotic heart disease of native coronary artery without angina pectoris: Secondary | ICD-10-CM | POA: Diagnosis not present

## 2017-06-13 DIAGNOSIS — I2583 Coronary atherosclerosis due to lipid rich plaque: Secondary | ICD-10-CM

## 2017-06-13 DIAGNOSIS — I10 Essential (primary) hypertension: Secondary | ICD-10-CM | POA: Diagnosis not present

## 2017-06-13 MED ORDER — HYDROCHLOROTHIAZIDE 25 MG PO TABS: 25.0000 mg | ORAL_TABLET | Freq: Every day | ORAL | 3 refills | Status: DC

## 2017-06-13 MED ORDER — ATORVASTATIN CALCIUM 40 MG PO TABS: 40.0000 mg | ORAL_TABLET | Freq: Every day | ORAL | 3 refills | Status: DC

## 2017-06-13 MED ORDER — POTASSIUM CHLORIDE CRYS ER 10 MEQ PO TBCR: 10.0000 meq | EXTENDED_RELEASE_TABLET | Freq: Every day | ORAL | 2 refills | Status: DC

## 2017-06-13 MED ORDER — AMLODIPINE BESYLATE 10 MG PO TABS: 10.0000 mg | ORAL_TABLET | Freq: Every day | ORAL | 3 refills | Status: DC

## 2017-06-13 MED ORDER — CLOPIDOGREL BISULFATE 75 MG PO TABS
75.0000 mg | ORAL_TABLET | Freq: Every day | ORAL | 1 refills | Status: DC
Start: 2017-06-13 — End: 2018-08-02

## 2017-06-13 MED ORDER — CLOPIDOGREL BISULFATE 75 MG PO TABS: 75.0000 mg | ORAL_TABLET | Freq: Every day | ORAL | 1 refills | Status: DC

## 2017-06-13 MED ORDER — LISINOPRIL 40 MG PO TABS: 40.0000 mg | ORAL_TABLET | Freq: Every day | ORAL | 2 refills | Status: DC

## 2017-06-13 MED ORDER — EZETIMIBE 10 MG PO TABS: 10.0000 mg | ORAL_TABLET | Freq: Every day | ORAL | 3 refills | Status: DC

## 2017-06-13 MED ORDER — NEBIVOLOL HCL 10 MG PO TABS: 10.0000 mg | ORAL_TABLET | Freq: Every day | ORAL | 2 refills | Status: DC

## 2017-06-13 MED ORDER — ISOSORBIDE MONONITRATE ER 60 MG PO TB24
60.0000 mg | ORAL_TABLET | Freq: Every day | ORAL | 3 refills | Status: DC
Start: 2017-06-13 — End: 2018-08-17

## 2017-06-13 MED ORDER — ISOSORBIDE MONONITRATE ER 60 MG PO TB24: 60.0000 mg | ORAL_TABLET | Freq: Every day | ORAL | 3 refills | Status: DC

## 2017-06-13 NOTE — Patient Instructions (Signed)
Medication Instructions:   Your physician recommends that you continue on your current medications as directed. Please refer to the Current Medication list given to you today.     Testing/Procedures:  Your physician has requested that you have an exercise tolerance test. For further information please visit [email or web address] Please also follow instruction sheet, as given.  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 If you don't receive a letter, please call our office to schedule the follow-up appointment. SCHEDULE GXT AND THIS APPOINTMENT IN EARLY September PER DR [name] FOR DOT PHYSICAL AND WORK-UP        If you need a refill on your cardiac medications before your next appointment, please call your pharmacy.

## 2017-06-13 NOTE — Progress Notes (Signed)
06/13/2017 [name]   Nov 13, 1965  [id number]  Primary Physician Jarrett Soho, PA-C Primary Cardiologist: Dr. Delton See    Reason for Visit/CC: f/u for CAD   HPI:  52 y/o male, followed by Dr. Delton See, with h/o CAD s/p CABG x 4 04/18/2015 by Dr. Tyrone Sage (LIMA to LAD, Left radial artery to OM, SVG to Intermediate, and SVG to PD), post operative atrial fibrillation managed with amiodarone, HTN, depression and anxiety. 1 month after his CABG, he was readmitted 04/2015 for recurrent CP and hypertensive urgency. Enzyme elevation was c/w NSTEMI. Subseuqnently, he underwent repeat [facility] which demonstrated occlusion of the SVG to the ramus intermediate. The native vessel does have moderate ostial stenosis but has TIMI 3 flow without other obstructive disease. It is possible that with occlusion of the SVG there may have been some thrombus showered downstream. His remaining 3 grafts were patent. Medical management was recommended. He was continued on ASA, a statin, BB and ARB. Plavix and a long acting nitrate were added to his regimen. He was also prescribed PRN SL NTG. He did well with medical therapy.  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 No lower extremity edema orthopnea or proximal nocturnal dyspnea. He had blood work done in November with LDL of 88 triglycerides 193, Zetia was added to his regimen he is scheduled to repeat his labs in February again. He's been compliant with his medication and has no side effects.  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  Current Meds  Medication Sig  . alfuzosin (UROXATRAL) 10 MG 24 hr  tablet Take 10 mg by mouth daily.   Marland Kitchen amLODipine (NORVASC) 10 MG tablet TAKE 1 TABLET (10 MG TOTAL) BY MOUTH DAILY.  Marland Kitchen aspirin EC 81 MG EC tablet Take 1 tablet (81 mg total) by mouth daily.  Marland Kitchen atorvastatin (LIPITOR) 40 MG tablet Take 1 tablet (40 mg total) by mouth daily.  Marland Kitchen BYSTOLIC 10 MG tablet TAKE 1 TABLET BY MOUTH EVERY DAY  . clopidogrel (PLAVIX) 75 MG tablet TAKE 1 TABLET (75 MG TOTAL) BY MOUTH DAILY.  Marland Kitchen ezetimibe (ZETIA) 10 MG tablet Take 1 tablet (10 mg total) by mouth daily.  Marland Kitchen FLUoxetine (PROZAC) 20 MG capsule Take 30 mg by mouth daily.   . isosorbide mononitrate (IMDUR) 60 MG 24 hr tablet Take 1 tablet (60 mg total) by mouth daily.  Marland Kitchen KLOR-CON M10 10 MEQ tablet TAKE 1 TABLET BY MOUTH EVERY DAY  . lisinopril (PRINIVIL,ZESTRIL) 40 MG tablet TAKE 1 TABLET (40 MG TOTAL) BY MOUTH DAILY.  . nitroGLYCERIN (NITROSTAT) 0.4 MG SL tablet Place 1 tablet (0.4 mg total) under the tongue every 5 (five) minutes x 3 doses as needed for chest pain.  . Omega-3 Fatty Acids (FISH OIL) 1000 MG CAPS Take 1 capsule (1,000 mg total) by mouth daily.  . pantoprazole (PROTONIX) 40 MG tablet TAKE 1 TABLET BY MOUTH EVERY DAY   Allergies  Allergen Reactions  . Atorvastatin Other (See Comments)    80 mg dose legs cramps, tolerates 40 mg dose  . Claritin [Loratadine] Other (See Comments)    Makes allergies worse-"a lot worse"  . Codeine Nausea  And Vomiting  . Vicodin [Hydrocodone-Acetaminophen] Nausea And Vomiting    "pretty much any codeine"   Past Medical History:  Diagnosis Date  . Anxiety   . Depression   . Hyperlipidemia   . Hypertension   . Lumbar herniated disc dx'd 03/2015  . NSTEMI (non-ST elevated myocardial infarction) (HCC) 04/15/2015   Family History  Problem Relation Age of Onset  . Heart attack Father 30       Deceased at this age  . Cancer Father        Lung  . Diabetes Sister    Past Surgical History:  Procedure Laterality Date  . BACK SURGERY    . CARDIAC CATHETERIZATION   04/15/2015  . CARDIAC CATHETERIZATION [name] 04/15/2015   Procedure: Left Heart Cath and Coronary Angiography;  Surgeon: Runell Gess, MD;  [facility] INVASIVE CV LAB;  Service: Cardiovascular;  Laterality: [name];  . CARDIAC CATHETERIZATION [name] 05/20/2015   Procedure: Left Heart Cath and Cors/Grafts Angiography;  Surgeon: Peter M Swaziland, MD;  Location: [facility] INVASIVE CV LAB;  Service: Cardiovascular;  Laterality: [name];  . CORONARY ARTERY BYPASS GRAFT [name] 04/18/2015   Procedure: CORONARY ARTERY BYPASS GRAFTING times four with LIMA  to LAD, LEFT RADIAL ARTERY to OM, Right saphenous vein harvesting andutilized to Interm and to PD ENDOSCOPIC GREATER SAPHENOUS VEIN HARVEST (EVH) RIGHT THIGH;  Surgeon: Delight Ovens, MD;  Location: [facility] OR;  Service: Open Heart Surgery;  Laterality: [name];  . LUMBAR LAMINECTOMY  2002  . RADIAL ARTERY HARVEST Left 04/18/2015   Procedure:  LEFT Arm RADIAL ARTERY HARVEST;  Surgeon: Delight Ovens, MD;  Location: [facility];  Service: Open Heart Surgery;  Laterality: Left;  . TEE WITHOUT CARDIOVERSION [name] 04/18/2015   Procedure: TRANSESOPHAGEAL ECHOCARDIOGRAM (TEE);  Surgeon: Delight Ovens, MD;  Location: [facility] OR;  Service: Open Heart Surgery;  Laterality: [name];  . TONSILLECTOMY  1960s   Social History   Socioeconomic History  . Marital status: Married    Spouse name: Not on file  . Number of children: Not on file  . Years of education: Not on file  . Highest education level: Not on file  Occupational History  . Not on file  Social Needs  . Financial resource strain: Not on file  . Food insecurity:    Worry: Not on file    Inability: Not on file  . Transportation needs:    Medical: Not on file    Non-medical: Not on file  Tobacco Use  . Smoking status: Current Some Day Smoker    Packs/day: 1.50    Years: 33.00    Pack years: 49.50    Types: Cigarettes    Last attempt to quit: 04/15/2015    Years since quitting: 2.1  . Smokeless tobacco: Never Used  Substance and  Sexual Activity  . Alcohol use: Yes    Alcohol/week: 0.0 oz    Comment: 04/15/2015 "stopped drinking in ~ 11/2014"  . Drug use: Yes    Types: Marijuana, Cocaine    Comment: "in my teens"  . Sexual activity: Not on file  Lifestyle  . Physical activity:    Days per week: Not on file    Minutes per session: Not on file  . Stress: Not on file  Relationships  . Social connections:    Talks on phone: Not on file    Gets together: Not on file    Attends religious service: Not on file    Active member of club  or organization: Not on file    Attends meetings of clubs or organizations: Not on file    Relationship status: Not on file  . Intimate partner violence:    Fear of current or ex partner: Not on file    Emotionally abused: Not on file    Physically abused: Not on file    Forced sexual activity: Not on file  Other Topics Concern  . Not on file  Social History Narrative  . Not on file     Review of Systems: General: negative for chills, fever, night sweats or weight changes.  Cardiovascular: negative for chest pain, dyspnea on exertion, edema, orthopnea, palpitations, paroxysmal nocturnal dyspnea or shortness of breath Dermatological: negative for rash Respiratory: negative for cough or wheezing Urologic: negative for hematuria Abdominal: negative for nausea, vomiting, diarrhea, bright red blood per rectum, melena, or hematemesis Neurologic: negative for visual changes, syncope, or dizziness All other systems reviewed and are otherwise negative except as noted above.   Physical Exam:  Blood pressure 134/78, pulse (!) 59, height 5\' 11"  (1.803 m), weight 274 lb 12.8 oz (124.6 kg), SpO2 95 %.  General appearance: alert, cooperative and no distress Neck: no carotid bruit and no JVD Lungs: clear to auscultation bilaterally Heart: regular rate and rhythm, S1, S2 normal, no murmur, click, rub or gallop Extremities: extremities normal, atraumatic, no cyanosis or edema Pulses: 2+  and symmetric Skin: Skin color, texture, turgor normal. No rashes or lesions Neurologic: Grossly normal  EKG: Was performed today 03/09/2017 showed sinus bradycardia otherwise normal EKG and unchanged from prior.    ASSESSMENT AND PLAN:   1. CAD: s/p NSTEMI in 03/2014 treated with CABG x 4. He had a second NSTEMI 04/2015 with cath revealing a failed SVG to RI. His other 3 grafts were patent. EF normal. Medical therapy elected. ASA, Plavix, Imdur, Lipitor, Bystolic, Lisinopril and Amlodipine. The patient is asymptomatic, he is advised to start regular exercise with either walking or treadmill or stationary bike 30 minutes 5 times a week, currently twice a week. We will refill his medications today.  2. Post operative atrial fibrillation: occurred post CABG and managed with short term amio. He denies any symptoms of recurrent afib. RRR on exam today. Resting HR is well controlled in the 50s on BB. No dizziness.  3. HTN: Controlled on current regimen. No changes made.   4. Hyperlipidemia: on Lipitor, Zetia and fish oil, advised to limit sweets and carbs.  5. Tobacco Abuse: Pt stopped but has returned to smoking. Smoking cessation was advised.   Follow-Up w/ Dr. Delton SeeNelson in 6 months - early September, arrange for exercise treadmill stress test for purposes of duty physical prior to that.  Tobias AlexanderKatarina Matrice Herro , MD [facility] 06/13/2017 8:17 AM

## 2017-11-11 ENCOUNTER — Telehealth: Payer: Self-pay | Admitting: Cardiology

## 2017-11-11 NOTE — Telephone Encounter (Signed)
Pt dropped off paper from [facility]. Placed in Dr.[name] doc box.

## 2017-11-16 ENCOUNTER — Telehealth: Payer: Self-pay | Admitting: Cardiology

## 2017-11-16 NOTE — Telephone Encounter (Signed)
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  Informed the pt that I will fax this form to the contact information provided on the paperwork, and I will also mail a copy to his confirmed address, once paperwork is complete.  Pt verbalized understanding and agrees with this plan.  Pt gracious for all the assistance provided.

## 2017-11-16 NOTE — Telephone Encounter (Signed)
° °  Patient calling for status of paperwork , dropped off last week.

## 2017-11-17 NOTE — Telephone Encounter (Signed)
Left a detailed message on the pts VM, stating that his DOT paperwork was filled out by Dr Delton SeeNelson this morning, then faxed off to information provided on the paperwork, and a copy has been mailed to his current mailing address on file.  Advised the pt to call the office back with any questions or concerns regarding message left.

## 2017-12-02 ENCOUNTER — Encounter (HOSPITAL_COMMUNITY): Payer: Self-pay

## 2017-12-02 ENCOUNTER — Emergency Department (HOSPITAL_COMMUNITY): Payer: Worker's Compensation

## 2017-12-02 ENCOUNTER — Emergency Department (HOSPITAL_COMMUNITY)
Admission: EM | Admit: 2017-12-02 | Discharge: 2017-12-02 | Disposition: A | Discharge status: Home / Self Care | Payer: Worker's Compensation | Attending: Emergency Medicine | Admitting: Emergency Medicine

## 2017-12-02 ENCOUNTER — Other Ambulatory Visit: Payer: Self-pay

## 2017-12-02 DIAGNOSIS — Y9389 Activity, other specified: Secondary | ICD-10-CM | POA: Diagnosis not present

## 2017-12-02 DIAGNOSIS — Z7982 Long term (current) use of aspirin: Secondary | ICD-10-CM | POA: Insufficient documentation

## 2017-12-02 DIAGNOSIS — I252 Old myocardial infarction: Secondary | ICD-10-CM | POA: Diagnosis not present

## 2017-12-02 DIAGNOSIS — Y929 Unspecified place or not applicable: Secondary | ICD-10-CM | POA: Insufficient documentation

## 2017-12-02 DIAGNOSIS — Z951 Presence of aortocoronary bypass graft: Secondary | ICD-10-CM | POA: Insufficient documentation

## 2017-12-02 DIAGNOSIS — Z7902 Long term (current) use of antithrombotics/antiplatelets: Secondary | ICD-10-CM | POA: Insufficient documentation

## 2017-12-02 DIAGNOSIS — S46291A Other injury of muscle, fascia and tendon of other parts of biceps, right arm, initial encounter: Secondary | ICD-10-CM | POA: Insufficient documentation

## 2017-12-02 DIAGNOSIS — I2510 Atherosclerotic heart disease of native coronary artery without angina pectoris: Secondary | ICD-10-CM | POA: Insufficient documentation

## 2017-12-02 DIAGNOSIS — F17210 Nicotine dependence, cigarettes, uncomplicated: Secondary | ICD-10-CM | POA: Diagnosis not present

## 2017-12-02 DIAGNOSIS — Y33XXXA Other specified events, undetermined intent, initial encounter: Secondary | ICD-10-CM | POA: Diagnosis not present

## 2017-12-02 DIAGNOSIS — E785 Hyperlipidemia, unspecified: Secondary | ICD-10-CM | POA: Diagnosis not present

## 2017-12-02 DIAGNOSIS — I10 Essential (primary) hypertension: Secondary | ICD-10-CM | POA: Insufficient documentation

## 2017-12-02 DIAGNOSIS — S46211A Strain of muscle, fascia and tendon of other parts of biceps, right arm, initial encounter: Secondary | ICD-10-CM

## 2017-12-02 DIAGNOSIS — S4991XA Unspecified injury of right shoulder and upper arm, initial encounter: Secondary | ICD-10-CM | POA: Diagnosis present

## 2017-12-02 DIAGNOSIS — Y998 Other external cause status: Secondary | ICD-10-CM | POA: Diagnosis not present

## 2017-12-02 DIAGNOSIS — Z79899 Other long term (current) drug therapy: Secondary | ICD-10-CM | POA: Diagnosis not present

## 2017-12-02 DIAGNOSIS — I480 Paroxysmal atrial fibrillation: Secondary | ICD-10-CM | POA: Insufficient documentation

## 2017-12-02 MED ORDER — TRAMADOL HCL 50 MG PO TABS: 50.0000 mg | ORAL_TABLET | Freq: Four times a day (QID) | ORAL | 0 refills | Status: DC | PRN

## 2017-12-02 NOTE — ED Provider Notes (Signed)
[facility]-EMERGENCY DEPT Provider Note   CSN: [id number] Arrival date & time: 12/02/17  1042     History   Chief Complaint Chief Complaint  Patient presents with  . Arm Injury    HPI [name] is a 52 y.o. male.  Pt presents to the ED today with right arm pain.  The pt was pulling a wench cable and it caught his arm and pulled it.  The pt felt a tear and a pop in his arm.  The pt points to his ac fossa.  The pt is on plavix secondary to CAD and stents.     Past Medical History:  Diagnosis Date  . Anxiety   . Depression   . Hyperlipidemia   . Hypertension   . Lumbar herniated disc dx'd 03/2015  . NSTEMI (non-ST elevated myocardial infarction) (HCC) 04/15/2015    Patient Active Problem List   Diagnosis Date Noted  . Hyperlipidemia 05/05/2016  . Coronary artery disease due to lipid rich plaque 08/07/2015  . S/P CABG (coronary artery bypass graft) 08/07/2015  . Essential hypertension 08/07/2015  . PAF (paroxysmal atrial fibrillation) (HCC) 08/07/2015  . Encounter for long-term (current) use of high-risk medication 08/07/2015  . Hypertensive emergency 05/20/2015  . CAD- LIMA to LAD, Left radial artery to OM, SVG to Intermediate, and SVG to PD) 03/2015. LHC 04/2014 w/ occluded SVG-RI   . 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 Hypokalemia 04/15/2015  . Essential (primary) hypertension 04/15/2015  . NSTEMI (non-ST elevated myocardial infarction) (HCC) 04/15/2015  . Tobacco abuse 04/15/2015    Past Surgical History:  Procedure Laterality Date  . BACK SURGERY    . CARDIAC CATHETERIZATION  04/15/2015  . CARDIAC CATHETERIZATION [name] 04/15/2015   Procedure: Left Heart Cath and Coronary Angiography;  Surgeon: Runell GessJonathan J Berry, MD;  [facility];  Service: Cardiovascular;  Laterality: [name];  . CARDIAC CATHETERIZATION [name] 05/20/2015   Procedure: Left Heart Cath and Cors/Grafts Angiography;  Surgeon: Peter M SwazilandJordan, MD;  [facility];   Service: Cardiovascular;  Laterality: [name];  . CORONARY ARTERY BYPASS GRAFT [name] 04/18/2015   Procedure: CORONARY ARTERY BYPASS GRAFTING times four with LIMA  to LAD, LEFT RADIAL ARTERY to OM, Right saphenous vein harvesting andutilized to Interm and to PD ENDOSCOPIC GREATER SAPHENOUS VEIN HARVEST (EVH) RIGHT THIGH;  Surgeon: Delight OvensEdward B Gerhardt, MD;  Location: [facility] OR;  Service: Open Heart Surgery;  Laterality: [name];  . LUMBAR LAMINECTOMY  2002  . RADIAL ARTERY HARVEST Left 04/18/2015   Procedure:  LEFT Arm RADIAL ARTERY HARVEST;  Surgeon: Delight OvensEdward B Gerhardt, MD;  [facility];  Service: Open Heart Surgery;  Laterality: Left;  . TEE WITHOUT CARDIOVERSION [name] 04/18/2015   Procedure: TRANSESOPHAGEAL ECHOCARDIOGRAM (TEE);  Surgeon: Delight OvensEdward B Gerhardt, MD;  [facility];  Service: Open Heart Surgery;  Laterality: [name];  . TONSILLECTOMY  1960s        Home Medications    Prior to Admission medications   Medication Sig Start Date End Date Taking? Authorizing Provider  alfuzosin (UROXATRAL) 10 MG 24 hr tablet Take 10 mg by mouth every evening.  11/23/16  Yes [provider]  amLODipine (NORVASC) 10 MG tablet Take 1 tablet (10 mg total) by mouth daily. 06/13/17  Yes Lars MassonNelson, Katarina H, MD  aspirin EC 81 MG EC tablet Take 1 tablet (81 mg total) by mouth daily. 05/21/15  Yes Simmons, Brittainy M, PA-C  atorvastatin (LIPITOR) 40 MG tablet Take 1 tablet (  40 mg total) by mouth daily. Patient taking differently: Take 40 mg by mouth every evening.  06/13/17 12/02/17 Yes Lars Masson, MD  clopidogrel (PLAVIX) 75 MG tablet Take 1 tablet (75 mg total) by mouth daily. 06/13/17  Yes Lars Masson, MD  ezetimibe (ZETIA) 10 MG tablet Take 1 tablet (10 mg total) by mouth daily. Patient taking differently: Take 10 mg by mouth every evening.  06/13/17 06/13/18 Yes Lars Masson, MD  finasteride (PROSCAR) 5 MG tablet Take 5 mg by mouth every evening. 09/08/17  Yes [provider]  FLUoxetine  (PROZAC) 20 MG tablet Take 30 mg by mouth daily. 10/19/17  Yes [provider]  hydrochlorothiazide (HYDRODIURIL) 25 MG tablet Take 1 tablet (25 mg total) by mouth daily. 06/13/17 12/02/17 Yes Lars Masson, MD  isosorbide mononitrate (IMDUR) 60 MG 24 hr tablet Take 1 tablet (60 mg total) by mouth daily. 06/13/17  Yes Lars Masson, MD  lisinopril (PRINIVIL,ZESTRIL) 40 MG tablet Take 1 tablet (40 mg total) by mouth daily. 06/13/17  Yes Lars Masson, MD  meloxicam (MOBIC) 15 MG tablet Take 15 mg by mouth daily.   Yes [provider]  nebivolol (BYSTOLIC) 10 MG tablet Take 1 tablet (10 mg total) by mouth daily. 06/13/17  Yes Lars Masson, MD  nitroGLYCERIN (NITROSTAT) 0.4 MG SL tablet Place 1 tablet (0.4 mg total) under the tongue every 5 (five) minutes x 3 doses as needed for chest pain. 12/02/16  Yes Robbie Lis M, PA-C  Omega-3 Fatty Acids (FISH OIL) 1000 MG CAPS Take 1 capsule (1,000 mg total) by mouth daily. Patient taking differently: Take 1,000 mg by mouth 2 (two) times daily.  03/09/17  Yes Lars Masson, MD  pantoprazole (PROTONIX) 40 MG tablet TAKE 1 TABLET BY MOUTH EVERY DAY Patient taking differently: Take 40 mg by mouth daily.  11/12/16  Yes Lars Masson, MD  potassium chloride (KLOR-CON M10) 10 MEQ tablet Take 1 tablet (10 mEq total) by mouth daily. Patient taking differently: Take 10 mEq by mouth every evening.  06/13/17  Yes Lars Masson, MD  traMADol (ULTRAM) 50 MG tablet Take 1 tablet (50 mg total) by mouth every 6 (six) hours as needed. 12/02/17   Jacalyn Lefevre, MD    Family History Family History  Problem Relation Age of Onset  . Heart attack Father 89       Deceased at this age  . Cancer Father        Lung  . Diabetes Sister     Social History Social History   Tobacco Use  . Smoking status: Current Some Day Smoker    Packs/day: 1.50    Years: 33.00    Pack years: 49.50    Types: Cigarettes    Last attempt to  quit: 04/15/2015    Years since quitting: 2.6  . Smokeless tobacco: Never Used  Substance Use Topics  . Alcohol use: Yes    Alcohol/week: 0.0 standard drinks    Comment: occasionally  . Drug use: Not Currently    Types: Marijuana, Cocaine    Comment: "in my teens"     Allergies   Atorvastatin; Claritin [loratadine]; Codeine; and Vicodin [hydrocodone-acetaminophen]   Review of Systems Review of Systems  Musculoskeletal:       Right arm pain  All other systems reviewed and are negative.    Physical Exam Updated Vital Signs BP (!) 140/92 (BP Location: Left Arm)   Temp 98.1 F (36.7  C) (Oral)   Ht 5\' 11"  (1.803 m)   Wt 118.8 kg   SpO2 97%   BMI 36.54 kg/m   Physical Exam  Constitutional: He is oriented to person, place, and time. He appears well-developed and well-nourished.  HENT:  Head: Normocephalic and atraumatic.  Right Ear: External ear normal.  Left Ear: External ear normal.  Nose: Nose normal.  Mouth/Throat: Oropharynx is clear and moist.  Eyes: Pupils are equal, round, and reactive to light. Conjunctivae and EOM are normal.  Neck: Normal range of motion. Neck supple.  Cardiovascular: Normal rate, regular rhythm, normal heart sounds and intact distal pulses.  Pulmonary/Chest: Effort normal and breath sounds normal.  Abdominal: Soft. Bowel sounds are normal.  Musculoskeletal:       Arms: Pt able to flex and extend right arm.  He is able to supinate and pronate.  Medial muscle belly of bicep is rolled up.    Neurological: He is alert and oriented to person, place, and time.  Skin: Skin is warm. Capillary refill takes less than 2 seconds.  Psychiatric: He has a normal mood and affect. His behavior is normal. Judgment and thought content normal.  Nursing note and vitals reviewed.    ED Treatments / Results  Labs (all labs ordered are listed, but only abnormal results are displayed) Labs Reviewed - No data to display  EKG None  Radiology Dg Elbow  Complete Right  Result Date: 12/02/2017 CLINICAL DATA:  Right elbow pain after injury. EXAM: RIGHT ELBOW - COMPLETE 3+ VIEW COMPARISON:  None. FINDINGS: There is no evidence of fracture, dislocation, or joint effusion. There is no evidence of arthropathy or other focal bone abnormality. Soft tissues are unremarkable. IMPRESSION: No significant abnormality seen in the right elbow. Electronically Signed   By: Lupita Raider, M.D.   On: 12/02/2017 12:40    Procedures Procedures (including critical care time)  Medications Ordered in ED Medications - No data to display   Initial Impression / Assessment and Plan / ED Course  I have reviewed the triage vital signs and the nursing notes.  Pertinent labs & imaging results that were available during my care of the patient were reviewed by me and considered in my medical decision making (see chart for details).    Pt requests [facility] as his wife has been there multiple times.  The pt was d/w Dr. Shelle Iron ([facility]) who said for pt to call and make an appt.  They should be able to get him in soon.  Pt placed in a sling.  He has a note for work from the work doctor.  He is instructed to keep arm elevated and ice it.  Final Clinical Impressions(s) / ED Diagnoses   Final diagnoses:  Biceps tendon rupture, traumatic, right, initial encounter    ED Discharge Orders         Ordered    traMADol (ULTRAM) 50 MG tablet  Every 6 hours PRN     12/02/17 1330           Jacalyn Lefevre, MD 12/02/17 1332

## 2017-12-02 NOTE — ED Notes (Signed)
Pt is alert  And oriented x 4 and is verbally responsive. Pt reports that he has 7/10 sharp pain to Rt upper arm. Pt has PMs to rt arm has some noted twitching, some associated swelling is noted to the rt hand.

## 2017-12-02 NOTE — ED Triage Notes (Signed)
Patient states he was pulling a wench cable. patient states the cable caught his right arm and he "felt a tear and a pop."

## 2017-12-08 ENCOUNTER — Telehealth: Payer: Self-pay | Admitting: *Deleted

## 2017-12-08 NOTE — Telephone Encounter (Signed)
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

## 2017-12-12 ENCOUNTER — Ambulatory Visit: Payer: BLUE CROSS/BLUE SHIELD | Admitting: Cardiology

## 2017-12-12 ENCOUNTER — Encounter: Payer: Self-pay | Admitting: Cardiology

## 2017-12-12 ENCOUNTER — Telehealth: Payer: Self-pay | Admitting: *Deleted

## 2017-12-12 ENCOUNTER — Telehealth: Payer: Self-pay | Admitting: Cardiology

## 2017-12-12 VITALS — BP 124/76 | HR 54 | Ht 71.0 in | Wt 265.4 lb

## 2017-12-12 DIAGNOSIS — I10 Essential (primary) hypertension: Secondary | ICD-10-CM | POA: Diagnosis not present

## 2017-12-12 DIAGNOSIS — E785 Hyperlipidemia, unspecified: Secondary | ICD-10-CM | POA: Diagnosis not present

## 2017-12-12 DIAGNOSIS — I2510 Atherosclerotic heart disease of native coronary artery without angina pectoris: Secondary | ICD-10-CM

## 2017-12-12 DIAGNOSIS — I2583 Coronary atherosclerosis due to lipid rich plaque: Secondary | ICD-10-CM

## 2017-12-12 DIAGNOSIS — Z951 Presence of aortocoronary bypass graft: Secondary | ICD-10-CM

## 2017-12-12 NOTE — Progress Notes (Signed)
12/12/2017 [name]   Sep 18, 1965  [id number]  Primary Physician Jarrett Soho, PA-C Primary Cardiologist: Dr. Delton See   Reason for Visit/CC: f/u for CAD   HPI:  52 y/o male, followed by Dr. Delton See, with h/o CAD s/p CABG x 4 04/18/2015 by Dr. Tyrone Sage (LIMA to LAD, Left radial artery to OM, SVG to Intermediate, and SVG to PD), post operative atrial fibrillation managed with amiodarone, HTN, depression and anxiety. 1 month after his CABG, he was readmitted 04/2015 for recurrent CP and hypertensive urgency. Enzyme elevation was c/w NSTEMI. Subseuqnently, he underwent repeat [facility] which demonstrated occlusion of the SVG to the ramus intermediate. The native vessel does have moderate ostial stenosis but has TIMI 3 flow without other obstructive disease. It is possible that with occlusion of the SVG there may have been some thrombus showered downstream. His remaining 3 grafts were patent. Medical management was recommended. He was continued on ASA, a statin, BB and ARB. Plavix and a long acting nitrate were added to his regimen. He was also prescribed PRN SL NTG. He did well with medical therapy.  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  12/12/2017 -the patient is coming after 6 months, he has been doing well, he injured himself on 911 pulling stuff out of his truck resulting in his right upper extremity biceps and possible necessity of a surgery.  He was advised to hold aspirin and fish oil.  He denies any chest pain shortness of breath lower extremity edema no palpitations no claudications.  He is compliant with his medications and has no side effects.  He has been walking a lot with no symptoms.   Current Meds  Medication Sig  . alfuzosin (UROXATRAL) 10 MG 24 hr  tablet Take 10 mg by mouth every evening.   Marland Kitchen amLODipine (NORVASC) 10 MG tablet Take 1 tablet (10 mg total) by mouth daily.  Marland Kitchen atorvastatin (LIPITOR) 40 MG tablet Take 1 tablet (40 mg total) by mouth daily.  . clopidogrel (PLAVIX) 75 MG tablet Take 1 tablet (75 mg total) by mouth daily.  Marland Kitchen ezetimibe (ZETIA) 10 MG tablet Take 1 tablet (10 mg total) by mouth daily.  . finasteride (PROSCAR) 5 MG tablet Take 5 mg by mouth every evening.  Marland Kitchen FLUoxetine (PROZAC) 20 MG tablet Take 30 mg by mouth daily.  . isosorbide mononitrate (IMDUR) 60 MG 24 hr tablet Take 1 tablet (60 mg total) by mouth daily.  Marland Kitchen lisinopril (PRINIVIL,ZESTRIL) 40 MG tablet Take 1 tablet (40 mg total) by mouth daily.  . meloxicam (MOBIC) 15 MG tablet Take 15 mg by mouth daily.  . nebivolol (BYSTOLIC) 10 MG tablet Take 1 tablet (10 mg total) by mouth daily.  . nitroGLYCERIN (NITROSTAT) 0.4 MG SL tablet Place 1 tablet (0.4 mg total) under the tongue every 5 (five) minutes x 3 doses as needed for chest pain.  . pantoprazole (PROTONIX) 40 MG tablet TAKE 1 TABLET BY MOUTH EVERY DAY  . potassium chloride (KLOR-CON M10) 10 MEQ tablet Take 1 tablet (10 mEq total) by mouth daily.  . traMADol (ULTRAM) 50 MG tablet Take 1 tablet (50 mg total) by mouth every 6 (six) hours as needed.   Allergies  Allergen Reactions  . Atorvastatin Other (See Comments)    80 mg  dose legs cramps, tolerates 40 mg dose  . Claritin [Loratadine] Other (See Comments)    Makes allergies worse-"a lot worse"  . Codeine Nausea And Vomiting  . Vicodin [Hydrocodone-Acetaminophen] Nausea And Vomiting    "pretty much any codeine"   Past Medical History:  Diagnosis Date  . Anxiety   . Depression   . Hyperlipidemia   . Hypertension   . Lumbar herniated disc dx'd 03/2015  . NSTEMI (non-ST elevated myocardial infarction) (HCC) 04/15/2015   Family History  Problem Relation Age of Onset  . Heart attack Father [id number]       Deceased at this age  . Cancer Father        Lung    . Diabetes Sister    Past Surgical History:  Procedure Laterality Date  . BACK SURGERY    . CARDIAC CATHETERIZATION  04/15/2015  . CARDIAC CATHETERIZATION [name] 04/15/2015   Procedure: Left Heart Cath and Coronary Angiography;  Surgeon: Runell GessJonathan J Berry, MD;  [facility];  Service: Cardiovascular;  Laterality: [name];  . CARDIAC CATHETERIZATION [name] 05/20/2015   Procedure: Left Heart Cath and Cors/Grafts Angiography;  Surgeon: Peter M SwazilandJordan, MD;  [facility];  Service: Cardiovascular;  Laterality: [name];  . CORONARY ARTERY BYPASS GRAFT [name] 04/18/2015   Procedure: CORONARY ARTERY BYPASS GRAFTING times four with LIMA  to LAD, LEFT RADIAL ARTERY to OM, Right saphenous vein harvesting andutilized to Interm and to PD ENDOSCOPIC GREATER SAPHENOUS VEIN HARVEST (EVH) RIGHT THIGH;  Surgeon: Delight OvensEdward B Gerhardt, MD;  Location: [facility] OR;  Service: Open Heart Surgery;  Laterality: [name];  . LUMBAR LAMINECTOMY  2002  . RADIAL ARTERY HARVEST Left 04/18/2015   Procedure:  LEFT Arm RADIAL ARTERY HARVEST;  Surgeon: Delight OvensEdward B Gerhardt, MD;  [facility];  Service: Open Heart Surgery;  Laterality: Left;  . TEE WITHOUT CARDIOVERSION [name] 04/18/2015   Procedure: TRANSESOPHAGEAL ECHOCARDIOGRAM (TEE);  Surgeon: Delight OvensEdward B Gerhardt, MD;  [facility];  Service: Open Heart Surgery;  Laterality: [name];  . TONSILLECTOMY  1960s   Social History   Socioeconomic History  . Marital status: Married    Spouse name: Not on file  . Number of children: Not on file  . Years of education: Not on file  . Highest education level: Not on file  Occupational History  . Not on file  Social Needs  . Financial resource strain: Not on file  . Food insecurity:    Worry: Not on file    Inability: Not on file  . Transportation needs:    Medical: Not on file    Non-medical: Not on file  Tobacco Use  . Smoking status: Current Some Day Smoker    Packs/day: 1.50    Years: 33.00    Pack years: 49.50    Types:  Cigarettes    Last attempt to quit: 04/15/2015    Years since quitting: 2.6  . Smokeless tobacco: Never Used  Substance and Sexual Activity  . Alcohol use: Yes    Alcohol/week: 0.0 standard drinks    Comment: occasionally  . Drug use: Not Currently    Types: Marijuana, Cocaine    Comment: "in my teens"  . Sexual activity: Not on file  Lifestyle  . Physical activity:    Days per week: Not on file    Minutes per session: Not on file  . Stress: Not on file  Relationships  . Social connections:    Talks on phone: Not on file  Gets together: Not on file    Attends religious service: Not on file    Active member of club or organization: Not on file    Attends meetings of clubs or organizations: Not on file    Relationship status: Not on file  . Intimate partner violence:    Fear of current or ex partner: Not on file    Emotionally abused: Not on file    Physically abused: Not on file    Forced sexual activity: Not on file  Other Topics Concern  . Not on file  Social History Narrative  . Not on file    Review of Systems: General: negative for chills, fever, night sweats or weight changes.  Cardiovascular: negative for chest pain, dyspnea on exertion, edema, orthopnea, palpitations, paroxysmal nocturnal dyspnea or shortness of breath Dermatological: negative for rash Respiratory: negative for cough or wheezing Urologic: negative for hematuria Abdominal: negative for nausea, vomiting, diarrhea, bright red blood per rectum, melena, or hematemesis Neurologic: negative for visual changes, syncope, or dizziness All other systems reviewed and are otherwise negative except as noted above.  Physical Exam:  Blood pressure 124/76, pulse (!) 54, height 5\' 11"  (1.803 m), weight 265 lb 6.4 oz (120.4 kg).  General appearance: alert, cooperative and no distress Neck: no carotid bruit and no JVD Lungs: clear to auscultation bilaterally Heart: regular rate and rhythm, S1, S2 normal, no  murmur, click, rub or gallop Extremities: extremities normal, atraumatic, no cyanosis or edema Pulses: 2+ and symmetric Skin: Skin color, texture, turgor normal. No rashes or lesions Neurologic: Grossly normal  EKG: Was performed today 12/12/2017 shows sinus bradycardia otherwise normal EKG, unchanged from prior, this was personally reviewed.   ASSESSMENT AND PLAN:   1. CAD: s/p NSTEMI in 03/2014 treated with CABG x 4. He had a second NSTEMI 04/2015 with cath revealing a failed SVG to RI. His other 3 grafts were patent. EF normal.  He is asymptomatic, his EKG is normal and unchanged, he is tolerating his medications well.  Currently off aspirin, if he requires surgery it would be okay to hold Plavix a week prior to the surgery.  No ischemic work-up at this point.  We will obtain all of his labs today.  2. Post operative atrial fibrillation: occurred post CABG and managed with short term amio. He denies any symptoms of recurrent afib.  No anticoagulation as it was one isolated episode.  3. HTN: Controlled.  4. Hyperlipidemia: on Lipitor, Zetia and fish oil, advised to limit sweets and carbs.  5. Tobacco Abuse: Pt stopped but has returned to smoking. Smoking cessation was advised.   Follow-Up w/ Dr. Delton See in 6 months, we will obtain CMP, TSH, CBC, lipids today.   Tobias Alexander , MD [facility] 12/12/2017 8:09 AM

## 2017-12-12 NOTE — Telephone Encounter (Signed)
DOD call with Dr Thurston HoleWainer on this pt.  Dr Delton SeeNelson saw this pt this morning in the office for pre-op clearance.  Dr Delton SeeNelson spoke with Dr Thurston HoleWainer about this pt and clearing him.

## 2017-12-12 NOTE — Telephone Encounter (Signed)
   [facility] Pre-operative Risk Assessment    Request for surgical clearance:  1. What type of surgery is being performed? Right distal bicep repair   2. When is this surgery scheduled? 12/20/17   3. What type of clearance is required (medical clearance vs. Pharmacy clearance to hold med vs. Both)? both  4. Are there any medications that need to be held prior to surgery and how long? Plavix   5. Practice name and name of physician performing surgery?   Lorn Junes, MD  6. What is your office phone number [phone]    7.   What is your office fax number [phone], attn: Leeloo Silverthorne  8.   Anesthesia type (None, local, MAC, general) ?    *please send most recent notes, labs, ekg, or special studies back with this clearanceMarch Rummage, Rexanne Inocencio L 12/12/2017, 5:27 PM  _________________________________________________________________   (provider comments below)

## 2017-12-12 NOTE — Patient Instructions (Signed)
Medication Instructions:   Your physician recommends that you continue on your current medications as directed. Please refer to the Current Medication list given to you today.    Labwork:  TODAY--CMET, CBC W DIFF, TSH, AND LIPIDS      Follow-Up:  Your physician wants you to follow-up in: 6 MONTHS WITH DR [name] You will receive a reminder letter in the mail two months in advance. If you don't receive a letter, please call our office to schedule the follow-up appointment.        If you need a refill on your cardiac medications before your next appointment, please call your pharmacy.   

## 2017-12-13 LAB — COMPREHENSIVE METABOLIC PANEL
ALT: 25 IU/L (ref 0–44)
AST: 15 IU/L (ref 0–40)
Albumin/Globulin Ratio: 1.6 (ref 1.2–2.2)
Albumin: 4.4 g/dL (ref 3.5–5.5)
Alkaline Phosphatase: 108 IU/L (ref 39–117)
BUN/Creatinine Ratio: 16 (ref 9–20)
BUN: 15 mg/dL (ref 6–24)
Bilirubin Total: 0.6 mg/dL (ref 0.0–1.2)
CO2: 23 mmol/L (ref 20–29)
Calcium: 9 mg/dL (ref 8.7–10.2)
Chloride: 101 mmol/L (ref 96–106)
Creatinine, Ser: 0.91 mg/dL (ref 0.76–1.27)
GFR calc Af Amer: 110 mL/min/{1.73_m2} (ref >59)
GFR calc non Af Amer: 95 mL/min/{1.73_m2} (ref >59)
Globulin, Total: 2.7 g/dL (ref 1.5–4.5)
Glucose: 104 mg/dL — ABNORMAL HIGH (ref 65–99)
Potassium: 3.7 mmol/L (ref 3.5–5.2)
Sodium: 140 mmol/L (ref 134–144)
Total Protein: 7.1 g/dL (ref 6.0–8.5)

## 2017-12-13 LAB — CBC WITH DIFFERENTIAL/PLATELET
Basophils Absolute: 0 10*3/uL (ref 0.0–0.2)
Basos: 0 %
EOS (ABSOLUTE): 0.2 10*3/uL (ref 0.0–0.4)
Eos: 3 %
Hematocrit: 43.4 % (ref 37.5–51.0)
Hemoglobin: 15.2 g/dL (ref 13.0–17.7)
Immature Grans (Abs): 0 10*3/uL (ref 0.0–0.1)
Immature Granulocytes: 0 %
Lymphocytes Absolute: 1.4 10*3/uL (ref 0.7–3.1)
Lymphs: 19 %
MCH: 30.2 pg (ref 26.6–33.0)
MCHC: 35 g/dL (ref 31.5–35.7)
MCV: 86 fL (ref 79–97)
Monocytes Absolute: 0.6 10*3/uL (ref 0.1–0.9)
Monocytes: 8 %
Neutrophils Absolute: 5.1 10*3/uL (ref 1.4–7.0)
Neutrophils: 70 %
Platelets: 148 10*3/uL — ABNORMAL LOW (ref 150–450)
RBC: 5.04 x10E6/uL (ref 4.14–5.80)
RDW: 14.5 % (ref 12.3–15.4)
WBC: 7.3 10*3/uL (ref 3.4–10.8)

## 2017-12-13 LAB — LIPID PANEL
Chol/HDL Ratio: 3.9 ratio (ref 0.0–5.0)
Cholesterol, Total: 136 mg/dL (ref 100–199)
HDL: 35 mg/dL — ABNORMAL LOW (ref >39)
LDL Calculated: 63 mg/dL (ref 0–99)
Triglycerides: 189 mg/dL — ABNORMAL HIGH (ref 0–149)
VLDL Cholesterol Cal: 38 mg/dL (ref 5–40)

## 2017-12-13 LAB — TSH: TSH: 3.61 u[IU]/mL (ref 0.450–4.500)

## 2017-12-13 NOTE — Telephone Encounter (Signed)
   Primary Cardiologist: Jeffery AlexanderKatarina [name], MD  Chart reviewed as part of pre-operative protocol coverage. Patient was contacted 12/13/2017 in reference to pre-operative risk assessment for pending surgery as outlined below.  [name] was last seen on 12/12/17 by Dr. Delton Griffin. Pt has hx of CAD s/p CABG x 4 04/18/2015 with early graft failure X1 after 1 month, other 3 grafts patent. Now doing well, active without angina.   Therefore, based on ACC/AHA guidelines, the patient would be at acceptable risk for the planned procedure without further cardiovascular testing.   He has a slight increase in his cardiac risk with surgery, but he is acceptable to proceed.   Per Dr. Delton Griffin he is Currently off aspirin, if he requires surgery it would be okay to hold Plavix a week prior to the surgery.   I will route this recommendation to the requesting party via Epic fax function and remove from pre-op pool.  Please call with questions.  Jeffery BonJanine Kishana Battey, NP 12/13/2017, 2:12 PM

## 2017-12-15 ENCOUNTER — Telehealth: Payer: Self-pay

## 2017-12-15 NOTE — Telephone Encounter (Signed)
Opened phone note in error 

## 2018-06-19 ENCOUNTER — Other Ambulatory Visit: Payer: Self-pay | Admitting: Cardiology

## 2018-06-19 DIAGNOSIS — Z951 Presence of aortocoronary bypass graft: Secondary | ICD-10-CM

## 2018-06-19 DIAGNOSIS — I2510 Atherosclerotic heart disease of native coronary artery without angina pectoris: Secondary | ICD-10-CM

## 2018-06-19 DIAGNOSIS — Z79899 Other long term (current) drug therapy: Secondary | ICD-10-CM

## 2018-06-19 DIAGNOSIS — I10 Essential (primary) hypertension: Secondary | ICD-10-CM

## 2018-06-19 DIAGNOSIS — E785 Hyperlipidemia, unspecified: Secondary | ICD-10-CM

## 2018-06-19 DIAGNOSIS — I2583 Coronary atherosclerosis due to lipid rich plaque: Secondary | ICD-10-CM

## 2018-08-01 ENCOUNTER — Other Ambulatory Visit: Payer: Self-pay | Admitting: Cardiology

## 2018-08-02 ENCOUNTER — Other Ambulatory Visit: Payer: Self-pay | Admitting: Cardiology

## 2018-08-02 DIAGNOSIS — Z79899 Other long term (current) drug therapy: Secondary | ICD-10-CM

## 2018-08-02 DIAGNOSIS — I10 Essential (primary) hypertension: Secondary | ICD-10-CM

## 2018-08-02 DIAGNOSIS — Z951 Presence of aortocoronary bypass graft: Secondary | ICD-10-CM

## 2018-08-02 DIAGNOSIS — I2510 Atherosclerotic heart disease of native coronary artery without angina pectoris: Secondary | ICD-10-CM

## 2018-08-02 DIAGNOSIS — E785 Hyperlipidemia, unspecified: Secondary | ICD-10-CM

## 2018-08-07 ENCOUNTER — Other Ambulatory Visit: Payer: Self-pay | Admitting: Cardiology

## 2018-08-07 DIAGNOSIS — Z79899 Other long term (current) drug therapy: Secondary | ICD-10-CM

## 2018-08-07 DIAGNOSIS — I2510 Atherosclerotic heart disease of native coronary artery without angina pectoris: Secondary | ICD-10-CM

## 2018-08-07 DIAGNOSIS — E785 Hyperlipidemia, unspecified: Secondary | ICD-10-CM

## 2018-08-07 DIAGNOSIS — I10 Essential (primary) hypertension: Secondary | ICD-10-CM

## 2018-08-07 DIAGNOSIS — I2583 Coronary atherosclerosis due to lipid rich plaque: Secondary | ICD-10-CM

## 2018-08-07 DIAGNOSIS — Z951 Presence of aortocoronary bypass graft: Secondary | ICD-10-CM

## 2018-08-11 ENCOUNTER — Other Ambulatory Visit: Payer: Self-pay | Admitting: Cardiology

## 2018-08-16 ENCOUNTER — Other Ambulatory Visit: Payer: Self-pay | Admitting: Cardiology

## 2018-08-16 DIAGNOSIS — Z951 Presence of aortocoronary bypass graft: Secondary | ICD-10-CM

## 2018-08-16 DIAGNOSIS — I10 Essential (primary) hypertension: Secondary | ICD-10-CM

## 2018-08-16 DIAGNOSIS — I2510 Atherosclerotic heart disease of native coronary artery without angina pectoris: Secondary | ICD-10-CM

## 2018-08-16 DIAGNOSIS — Z79899 Other long term (current) drug therapy: Secondary | ICD-10-CM

## 2018-08-16 DIAGNOSIS — E785 Hyperlipidemia, unspecified: Secondary | ICD-10-CM

## 2018-08-16 DIAGNOSIS — I2583 Coronary atherosclerosis due to lipid rich plaque: Secondary | ICD-10-CM

## 2018-08-27 ENCOUNTER — Other Ambulatory Visit: Payer: Self-pay | Admitting: Cardiology

## 2018-08-28 DIAGNOSIS — F411 Generalized anxiety disorder: Secondary | ICD-10-CM | POA: Diagnosis not present

## 2018-08-28 DIAGNOSIS — Z720 Tobacco use: Secondary | ICD-10-CM | POA: Diagnosis not present

## 2018-08-28 DIAGNOSIS — I2510 Atherosclerotic heart disease of native coronary artery without angina pectoris: Secondary | ICD-10-CM | POA: Diagnosis not present

## 2018-08-28 DIAGNOSIS — I10 Essential (primary) hypertension: Secondary | ICD-10-CM | POA: Diagnosis not present

## 2018-08-28 DIAGNOSIS — N401 Enlarged prostate with lower urinary tract symptoms: Secondary | ICD-10-CM | POA: Diagnosis not present

## 2018-09-24 ENCOUNTER — Other Ambulatory Visit: Payer: Self-pay | Admitting: Cardiology

## 2018-10-13 DIAGNOSIS — N509 Disorder of male genital organs, unspecified: Secondary | ICD-10-CM | POA: Diagnosis not present

## 2018-10-19 DIAGNOSIS — H52223 Regular astigmatism, bilateral: Secondary | ICD-10-CM | POA: Diagnosis not present

## 2018-10-19 DIAGNOSIS — H524 Presbyopia: Secondary | ICD-10-CM | POA: Diagnosis not present

## 2018-10-25 DIAGNOSIS — R972 Elevated prostate specific antigen [PSA]: Secondary | ICD-10-CM | POA: Diagnosis not present

## 2018-11-03 ENCOUNTER — Other Ambulatory Visit: Payer: Self-pay | Admitting: Cardiology

## 2018-11-10 ENCOUNTER — Other Ambulatory Visit: Payer: Self-pay | Admitting: *Deleted

## 2018-11-10 ENCOUNTER — Telehealth: Payer: Self-pay | Admitting: *Deleted

## 2018-11-10 NOTE — Telephone Encounter (Signed)
..   Virtual Visit Pre-Appointment Phone Call  "(Name), I am calling you today to discuss your upcoming appointment. We are currently trying to limit exposure to the virus that causes COVID-19 by seeing patients at home rather than in the office."  1. "What is the BEST phone number to call the day of the visit?" - include this in appointment notes  2. "Do you have or have access to (through a family member/friend) a smartphone with video capability that we can use for your visit?" a. If yes - list this number in appt notes as "cell" (if different from BEST phone #) and list the appointment type as a VIDEO visit in appointment notes b. If no - list the appointment type as a PHONE visit in appointment notes  3. Confirm consent - "In the setting of the current Covid19 crisis, you are scheduled for a (phone or video) visit with your provider on (date) at (time).  Just as we do with many in-office visits, in order for you to participate in this visit, we must obtain consent.  If you'd like, I can send this to your mychart (if signed up) or email for you to review.  Otherwise, I can obtain your verbal consent now.  All virtual visits are billed to your insurance company just like a normal visit would be.  By agreeing to a virtual visit, we'd like you to understand that the technology does not allow for your provider to perform an examination, and thus may limit your provider's ability to fully assess your condition. If your provider identifies any concerns that need to be evaluated in person, we will make arrangements to do so.  Finally, though the technology is pretty good, we cannot assure that it will always work on either your or our end, and in the setting of a video visit, we may have to convert it to a phone-only visit.  In either situation, we cannot ensure that we have a secure connection.  Are you willing to proceed?" STAFF: Did the patient verbally acknowledge consent to telehealth visit? Document  YES/NO here: YES  4. Advise patient to be prepared - "Two hours prior to your appointment, go ahead and check your blood pressure, pulse, oxygen saturation, and your weight (if you have the equipment to check those) and write them all down. When your visit starts, your provider will ask you for this information. If you have an Apple Watch or Kardia device, please plan to have heart rate information ready on the day of your appointment. Please have a pen and paper handy nearby the day of the visit as well."  5. Give patient instructions for MyChart download to smartphone OR Doximity/Doxy.me as below if video visit (depending on what platform provider is using)  6. Inform patient they will receive a phone call 15 minutes prior to their appointment time (may be from unknown caller ID) so they should be prepared to answer    TELEPHONE CALL NOTE  [name] has been deemed a candidate for a follow-up tele-health visit to limit community exposure during the Covid-19 pandemic. I spoke with the patient via phone to ensure availability of phone/video source, confirm preferred email & phone number, and discuss instructions and expectations.  I reminded [name] to be prepared with any vital sign and/or heart rhythm information that could potentially be obtained via home monitoring, at the time of his visit. I reminded [name] to expect a phone call prior to his visit.  [name], [name] L, CMA 11/10/2018 1:37 PM   INSTRUCTIONS FOR DOWNLOADING THE MYCHART APP TO SMARTPHONE  - The patient must first make sure to have activated MyChart and know their login information - If Apple, go to [employer] Store and type in MyChart in the search bar and download the app. If Android, ask patient to go to [employer] Play Store and type in NewtonMyChart in the search bar and download the app. The app is free but as with any other app downloads, their phone may require them to verify saved payment information or  Apple/Android password.  - The patient will need to then log into the app with their MyChart username and password, and select [facility] as their healthcare provider to link the account. When it is time for your visit, go to the MyChart app, find appointments, and click Begin Video Visit. Be sure to Select Allow for your device to access the Microphone and Camera for your visit. You will then be connected, and your provider will be with you shortly.  **If they have any issues connecting, or need assistance please contact MyChart service desk [phone])**  **If using a computer, in order to ensure the best quality for their visit they will need to use either of the following Internet Browsers: [employer] Edge, or Google Chrome**  IF USING DOXIMITY or DOXY.ME - The patient will receive a link just prior to their visit by text.     FULL LENGTH CONSENT FOR TELE-HEALTH VISIT   I hereby voluntarily request, consent and authorize [facility] and its employed or contracted physicians, physician assistants, nurse practitioners or other licensed health care professionals (the Practitioner), to provide me with telemedicine health care services (the "Services") as deemed necessary by the treating Practitioner. I acknowledge and consent to receive the Services by the Practitioner via telemedicine. I understand that the telemedicine visit will involve communicating with the Practitioner through live audiovisual communication technology and the disclosure of certain medical information by electronic transmission. I acknowledge that I have been given the opportunity to request an in-person assessment or other available alternative prior to the telemedicine visit and am voluntarily participating in the telemedicine visit.  I understand that I have the right to withhold or withdraw my consent to the use of telemedicine in the course of my care at any time, without affecting my right to future care  or treatment, and that the Practitioner or I may terminate the telemedicine visit at any time. I understand that I have the right to inspect all information obtained and/or recorded in the course of the telemedicine visit and may receive copies of available information for a reasonable fee.  I understand that some of the potential risks of receiving the Services via telemedicine include:  Marland Kitchen. Delay or interruption in medical evaluation due to technological equipment failure or disruption; . Information transmitted may not be sufficient (e.g. poor resolution of images) to allow for appropriate medical decision making by the Practitioner; and/or  . In rare instances, security protocols could fail, causing a breach of personal health information.  Furthermore, I acknowledge that it is my responsibility to provide information about my medical history, conditions and care that is complete and accurate to the best of my ability. I acknowledge that Practitioner's advice, recommendations, and/or decision may be based on factors not within their control, such as incomplete or inaccurate data provided by me or distortions of diagnostic images or specimens that may result from electronic transmissions. I understand that  the practice of medicine is not an exact science and that Practitioner makes no warranties or guarantees regarding treatment outcomes. I acknowledge that I will receive a copy of this consent concurrently upon execution via email to the email address I last provided but may also request a printed copy by calling the office of [facility].    I understand that my insurance will be billed for this visit.   I have read or had this consent read to me. . I understand the contents of this consent, which adequately explains the benefits and risks of the Services being provided via telemedicine.  . I have been provided ample opportunity to ask questions regarding this consent and the Services and have had  my questions answered to my satisfaction. . I give my informed consent for the services to be provided through the use of telemedicine in my medical care  By participating in this telemedicine visit I agree to the above.

## 2018-11-12 NOTE — Progress Notes (Signed)
Virtual Visit via Telephone Note   This visit type was conducted due to national recommendations for restrictions regarding the COVID-19 Pandemic (e.g. social distancing) in an effort to limit this patient's exposure and mitigate transmission in our community.  Due to his co-morbid illnesses, this patient is at least at moderate risk for complications without adequate follow up.  This format is felt to be most appropriate for this patient at this time.  The patient did not have access to video technology/had technical difficulties with video requiring transitioning to audio format only (telephone).  All issues noted in this document were discussed and addressed.  No physical exam could be performed with this format.  Please refer to the patient's chart for his  consent to telehealth for [facility].   Date:  11/13/2018   ID:  [name], DOB [birth year], MRN [id number]  Patient Location: Home Provider Location: Home  PCP:  Marda Stalker, PA-C  Cardiologist:  Ena Dawley, MD  Electrophysiologist:  None   Evaluation Performed:  Follow-Up Visit  Reason for Visit/CC: f/u for CAD   History of Present Illness:    [name] is a 53 y.o. male with h/o CAD s/p CABG x 4 04/18/2015 by Dr. Servando Snare (LIMA to LAD, Left radial artery to OM, SVG to Intermediate, and SVG to PD), post operative atrial fibrillation managed with amiodarone, HTN, depression and anxiety. 1 month after his CABG, he was readmitted 04/2015 for recurrent CP and hypertensive urgency. Enzyme elevation was c/w NSTEMI. Subsequently, he underwent repeat [facility] which demonstrated occlusion of the SVG to the ramus intermediate. The native vessel does have moderate ostial stenosis but has TIMI 3 flow without other obstructive disease. It is possible that with occlusion of the SVG there may have been some thrombus showered downstream. His remaining 3 grafts were patent. Medical management was recommended. He was continued on  ASA, a statin, BB and ARB. Plavix and a long acting nitrate were added to his regimen. He was also prescribed PRN SL NTG. He did well with medical therapy.  He was last seen in September 2019 when he was doing well, he injured himself on 911 pulling stuff out of his truck resulting in his right upper extremity biceps and possible necessity of a surgery.  He was advised to hold aspirin and fish oil.  He denies any chest pain shortness of breath lower extremity edema no palpitations no claudications.  He is compliant with his medications and has no side effects.  He has been walking a lot with no symptoms.  11/13/2018 - 1 year follow up, the patient is doing really well and has lost 40 pounds by cutting down food portions and changing his diet to a healthier 1.  He has absolutely no symptoms of chest pain or shortness of breath, he has been compliant with his medications and he tolerates them very well.  He had blood work done with his primary care physician in June and he had normal kidney and liver function, normal LDL and elevated triglycerides at 209 however since then he has lost significant amount of weight.  He is compliant with Zetia and atorvastatin.  The patient does not have symptoms concerning for COVID-19 infection (fever, chills, cough, or new shortness of breath).    Past Medical History:  Diagnosis Date   Anxiety    Depression    Hyperlipidemia    Hypertension    Lumbar herniated disc dx'd 03/2015   NSTEMI (non-ST elevated myocardial infarction) ([place]) 04/15/2015  Past Surgical History:  Procedure Laterality Date   BACK SURGERY     CARDIAC CATHETERIZATION  04/15/2015   CARDIAC CATHETERIZATION [name] 04/15/2015   Procedure: Left Heart Cath and Coronary Angiography;  Surgeon: Runell GessJonathan J Berry, MD;  [facility];  Service: Cardiovascular;  Laterality: [name];   CARDIAC CATHETERIZATION [name] 05/20/2015   Procedure: Left Heart Cath and Cors/Grafts Angiography;  Surgeon:  Peter M SwazilandJordan, MD;  [facility];  Service: Cardiovascular;  Laterality: [name];   CORONARY ARTERY BYPASS GRAFT [name] 04/18/2015   Procedure: CORONARY ARTERY BYPASS GRAFTING times four with LIMA  to LAD, LEFT RADIAL ARTERY to OM, Right saphenous vein harvesting andutilized to Interm and to PD ENDOSCOPIC GREATER SAPHENOUS VEIN HARVEST (EVH) RIGHT THIGH;  Surgeon: Delight OvensEdward B Gerhardt, MD;  Location: [facility] OR;  Service: Open Heart Surgery;  Laterality: [name];   LUMBAR LAMINECTOMY  2002   RADIAL ARTERY HARVEST Left 04/18/2015   Procedure:  LEFT Arm RADIAL ARTERY HARVEST;  Surgeon: Delight OvensEdward B Gerhardt, MD;  [facility];  Service: Open Heart Surgery;  Laterality: Left;   TEE WITHOUT CARDIOVERSION [name] 04/18/2015   Procedure: TRANSESOPHAGEAL ECHOCARDIOGRAM (TEE);  Surgeon: Delight OvensEdward B Gerhardt, MD;  [facility];  Service: Open Heart Surgery;  Laterality: [name];   TONSILLECTOMY  1960s     No outpatient medications have been marked as taking for the 11/13/18 encounter (Telemedicine) with Lars MassonNelson, Wayde Gopaul H, MD.     Allergies:   Atorvastatin, Claritin [loratadine], Codeine, and Vicodin [hydrocodone-acetaminophen]   Social History   Tobacco Use   Smoking status: Current Some Day Smoker    Packs/day: 1.50    Years: 33.00    Pack years: 49.50    Types: Cigarettes    Last attempt to quit: 04/15/2015    Years since quitting: 3.5   Smokeless tobacco: Never Used  Substance Use Topics   Alcohol use: Yes    Alcohol/week: 0.0 standard drinks    Comment: occasionally   Drug use: Not Currently    Types: Marijuana, Cocaine    Comment: "in my teens"     Family Hx: The patient's family history includes Cancer in his father; Diabetes in his sister; Heart attack (age of onset: 2951) in his father.  ROS:   Please see the history of present illness.    All other systems reviewed and are negative.   Prior CV studies:   The following studies were reviewed today:  TTE: 04/2015  - Left  ventricle: The cavity size was normal. Wall thickness was   increased in a pattern of mild LVH. Basal inferior severe   hypokinesis, inferolateral hypokinesis. Systolic function was low   normal to mildly reduced. The estimated ejection fraction was in   the range of 50% to 55%. Doppler parameters are consistent with   abnormal left ventricular relaxation (grade 1 diastolic   dysfunction). - Aortic valve: There was no stenosis. - Ascending aorta: The ascending aorta was mildly dilated at 3.7   cm. - Mitral valve: There was no significant regurgitation. - Left atrium: The atrium was mildly dilated. - Right ventricle: Poorly visualized. - Pulmonary arteries: No complete TR doppler jet so unable to   estimate PA systolic pressure. - Inferior vena cava: The vessel was normal in size. The   respirophasic diameter changes were in the normal range (>= 50%),   consistent with normal central venous pressure. - Pericardium, extracardiac: A trivial pericardial effusion was   identified.  Labs/Other Tests and  Data Reviewed:    EKG:  No ECG reviewed.  Recent Labs: 12/12/2017: ALT 25; BUN 15; Creatinine, Ser 0.91; Hemoglobin 15.2; Platelets 148; Potassium 3.7; Sodium 140; TSH 3.610   Recent Lipid Panel Lab Results  Component Value Date/Time   CHOL 136 12/12/2017 08:21 AM   TRIG 189 (H) 12/12/2017 08:21 AM   HDL 35 (L) 12/12/2017 08:21 AM   CHOLHDL 3.9 12/12/2017 08:21 AM   CHOLHDL 3.8 05/20/2015 05:14 AM   LDLCALC 63 12/12/2017 08:21 AM    Wt Readings from Last 3 Encounters:  11/13/18 250 lb (113.4 kg)  12/12/17 265 lb 6.4 oz (120.4 kg)  12/02/17 262 lb (118.8 kg)     Objective:    Vital Signs:  BP 131/80    Pulse (!) 57    Wt 250 lb (113.4 kg)    BMI 34.87 kg/m    VITAL SIGNS:  reviewed     ASSESSMENT & PLAN:    1. CAD: s/p NSTEMI in 03/2014 treated with CABG x 4. He had a second NSTEMI 04/2015 with cath revealing a failed SVG to RI. His other 3 grafts were patent. EF normal.    The patient is completely asymptomatic, compliant with his medications and able to tolerate them, he has changed his diet lost a lot of weight and feels great, he works as a Corporate investment bankerconstruction worker and has no symptoms.  No ischemic work-up is required right now.  Continue the same meds.  No bleeding with dual antiplatelet therapy.  2. Post operative atrial fibrillation: occurred post CABG and managed with short term amio.  No recurrence since his bypass surgery.  3. HTN: Controlled.  4. Hyperlipidemia: on Lipitor, Zetia and fish oil, he has changed diet completely his LDL is excellent, mildly elevated triglycerides however he has changed diet since then.  5. Tobacco Abuse:  He has occasional cigarette.   COVID-19 Education: The signs and symptoms of COVID-19 were discussed with the patient and how to seek care for testing (follow up with PCP or arrange E-visit).  The importance of social distancing was discussed today.  Time:   Today, I have spent 25 minutes with the patient with telehealth technology discussing the above problems.    Medication Adjustments/Labs and Tests Ordered: Current medicines are reviewed at length with the patient today.  Concerns regarding medicines are outlined above.   Tests Ordered: No orders of the defined types were placed in this encounter.  Medication Changes: No orders of the defined types were placed in this encounter.  Follow Up:  In Person in 6 month(s)  Signed, Tobias AlexanderKatarina Sipriano Fendley, MD  11/13/2018 8:23 AM    [facility]

## 2018-11-13 ENCOUNTER — Telehealth (INDEPENDENT_AMBULATORY_CARE_PROVIDER_SITE_OTHER): Payer: BLUE CROSS/BLUE SHIELD | Admitting: Cardiology

## 2018-11-13 ENCOUNTER — Other Ambulatory Visit: Payer: Self-pay

## 2018-11-13 ENCOUNTER — Encounter: Payer: Self-pay | Admitting: Cardiology

## 2018-11-13 VITALS — BP 131/80 | HR 57 | Wt 250.0 lb

## 2018-11-13 DIAGNOSIS — I10 Essential (primary) hypertension: Secondary | ICD-10-CM

## 2018-11-13 DIAGNOSIS — I25110 Atherosclerotic heart disease of native coronary artery with unstable angina pectoris: Secondary | ICD-10-CM

## 2018-11-13 DIAGNOSIS — I2583 Coronary atherosclerosis due to lipid rich plaque: Secondary | ICD-10-CM

## 2018-11-13 DIAGNOSIS — I2510 Atherosclerotic heart disease of native coronary artery without angina pectoris: Secondary | ICD-10-CM | POA: Diagnosis not present

## 2018-11-13 DIAGNOSIS — Z79899 Other long term (current) drug therapy: Secondary | ICD-10-CM

## 2018-11-13 DIAGNOSIS — Z951 Presence of aortocoronary bypass graft: Secondary | ICD-10-CM | POA: Diagnosis not present

## 2018-11-13 DIAGNOSIS — E785 Hyperlipidemia, unspecified: Secondary | ICD-10-CM

## 2018-11-13 MED ORDER — NEBIVOLOL HCL 10 MG PO TABS: 10.0000 mg | ORAL_TABLET | Freq: Every day | ORAL | 2 refills | Status: DC

## 2018-11-13 MED ORDER — EZETIMIBE 10 MG PO TABS: 10.0000 mg | ORAL_TABLET | Freq: Every day | ORAL | 2 refills | Status: DC

## 2018-11-13 MED ORDER — CLOPIDOGREL BISULFATE 75 MG PO TABS: 75.0000 mg | ORAL_TABLET | Freq: Every day | ORAL | 2 refills | Status: DC

## 2018-11-13 MED ORDER — LISINOPRIL 40 MG PO TABS: 40.0000 mg | ORAL_TABLET | Freq: Every day | ORAL | 2 refills | Status: DC

## 2018-11-13 MED ORDER — PANTOPRAZOLE SODIUM 40 MG PO TBEC: 40.0000 mg | DELAYED_RELEASE_TABLET | Freq: Every day | ORAL | 2 refills | Status: DC

## 2018-11-13 MED ORDER — HYDROCHLOROTHIAZIDE 25 MG PO TABS: 25.0000 mg | ORAL_TABLET | Freq: Every day | ORAL | 2 refills | Status: DC

## 2018-11-13 MED ORDER — POTASSIUM CHLORIDE CRYS ER 10 MEQ PO TBCR: 10.0000 meq | EXTENDED_RELEASE_TABLET | Freq: Every day | ORAL | 2 refills | Status: DC

## 2018-11-13 MED ORDER — AMLODIPINE BESYLATE 10 MG PO TABS: 10.0000 mg | ORAL_TABLET | Freq: Every day | ORAL | 2 refills | Status: DC

## 2018-11-13 MED ORDER — ATORVASTATIN CALCIUM 40 MG PO TABS: 40.0000 mg | ORAL_TABLET | Freq: Every day | ORAL | 2 refills | Status: DC

## 2018-11-13 MED ORDER — ISOSORBIDE MONONITRATE ER 60 MG PO TB24: 60.0000 mg | ORAL_TABLET | Freq: Every day | ORAL | 2 refills | Status: DC

## 2018-11-13 NOTE — Patient Instructions (Signed)
Medication Instructions:   Your physician recommends that you continue on your current medications as directed. Please refer to the Current Medication list given to you today.   If you need a refill on your cardiac medications before your next appointment, please call your pharmacy.     Follow-Up: At [facility], you and your health needs are our priority.  As part of our continuing mission to provide you with exceptional heart care, we have created designated Provider Care Teams.  These Care Teams include your primary Cardiologist (physician) and Advanced Practice Providers (APPs -  Physician Assistants and Nurse Practitioners) who all work together to provide you with the care you need, when you need it.  Your physician wants you to follow-up in: 6 MONTHS WITH DR [name] You will receive a reminder letter in the mail two months in advance. If you don't receive a letter, please call our office to schedule the follow-up appointment.    

## 2018-11-13 NOTE — Addendum Note (Signed)
Addended by: Nuala Alpha on: 11/13/2018 08:45 AM   Modules accepted: Orders

## 2018-11-23 DIAGNOSIS — R972 Elevated prostate specific antigen [PSA]: Secondary | ICD-10-CM | POA: Diagnosis not present

## 2018-12-05 DIAGNOSIS — N401 Enlarged prostate with lower urinary tract symptoms: Secondary | ICD-10-CM | POA: Diagnosis not present

## 2018-12-05 DIAGNOSIS — R972 Elevated prostate specific antigen [PSA]: Secondary | ICD-10-CM | POA: Diagnosis not present

## 2018-12-05 DIAGNOSIS — R351 Nocturia: Secondary | ICD-10-CM | POA: Diagnosis not present

## 2018-12-18 ENCOUNTER — Ambulatory Visit: Payer: BLUE CROSS/BLUE SHIELD | Admitting: Family Medicine

## 2018-12-18 ENCOUNTER — Other Ambulatory Visit: Payer: Self-pay

## 2018-12-18 ENCOUNTER — Encounter: Payer: Self-pay | Admitting: Family Medicine

## 2018-12-18 DIAGNOSIS — M5416 Radiculopathy, lumbar region: Secondary | ICD-10-CM | POA: Diagnosis not present

## 2018-12-18 MED ORDER — PREDNISONE 5 MG PO TABS: ORAL_TABLET | ORAL | 0 refills | Status: DC

## 2018-12-18 NOTE — Patient Instructions (Signed)
Nice to meet you Please try the medicine  Please try the exercises and stretches. Try to perform these during work.  Keep up the good work with the weight loss   Please send me a message in [place] with any questions or updates.  Please see me back in 4-6 weeks.   --Dr. Raeford Razor

## 2018-12-18 NOTE — Assessment & Plan Note (Signed)
Symptoms seem most consistent with radiculopathy.  History of surgery with prior symptoms on the same side. -Prednisone. -Counseled on home exercise therapy and supportive care. - If no improvement consider physical therapy or gabapentin.

## 2018-12-18 NOTE — Progress Notes (Signed)
[name] - 53 y.o. male MRN [id number]  Date of birth: 1965/09/21  SUBJECTIVE:  Including CC & ROS.  Chief Complaint  Patient presents with  . Back Pain    left-sided low back    [name] is a 53 y.o. male that is presenting with low back pain and left-sided leg pain.  The pain started after he had a sneeze.  It is been going on for a couple of days.  The pain is moderate to severe.  It seems to be worse if he puts pressure on that side.  He has a history of a laminectomy from 2002.  He has had a flare previously.  Pain is occurring on the left lateral leg.  Mild pain on the left lower back.  No saddle anesthesia or urinary incontinence.   Review of Systems  Constitutional: Negative for fever.  HENT: Negative for congestion.   Respiratory: Negative for cough.   Cardiovascular: Negative for chest pain.  Gastrointestinal: Negative for abdominal pain.  Musculoskeletal: Positive for back pain.  Skin: Negative for color change.  Neurological: Negative for weakness.  Hematological: Negative for adenopathy.    HISTORY: Past Medical, Surgical, Social, and Family History Reviewed & Updated per EMR.   Pertinent Historical Findings include:  Past Medical History:  Diagnosis Date  . Anxiety   . Depression   . Hyperlipidemia   . Hypertension   . Lumbar herniated disc dx'd 03/2015  . NSTEMI (non-ST elevated myocardial infarction) (HCC) 04/15/2015    Past Surgical History:  Procedure Laterality Date  . BACK SURGERY    . CARDIAC CATHETERIZATION  04/15/2015  . CARDIAC CATHETERIZATION [name] 04/15/2015   Procedure: Left Heart Cath and Coronary Angiography;  Surgeon: Runell Gess, MD;  Location: [facility] INVASIVE CV LAB;  Service: Cardiovascular;  Laterality: [name];  . CARDIAC CATHETERIZATION [name] 05/20/2015   Procedure: Left Heart Cath and Cors/Grafts Angiography;  Surgeon: Peter M Swaziland, MD;  Location: [facility] INVASIVE CV LAB;  Service: Cardiovascular;  Laterality: [name];  . CORONARY ARTERY  BYPASS GRAFT [name] 04/18/2015   Procedure: CORONARY ARTERY BYPASS GRAFTING times four with LIMA  to LAD, LEFT RADIAL ARTERY to OM, Right saphenous vein harvesting andutilized to Interm and to PD ENDOSCOPIC GREATER SAPHENOUS VEIN HARVEST (EVH) RIGHT THIGH;  Surgeon: Delight Ovens, MD;  Location: [facility] OR;  Service: Open Heart Surgery;  Laterality: [name];  . LUMBAR LAMINECTOMY  2002  . RADIAL ARTERY HARVEST Left 04/18/2015   Procedure:  LEFT Arm RADIAL ARTERY HARVEST;  Surgeon: Delight Ovens, MD;  Location: [facility] OR;  Service: Open Heart Surgery;  Laterality: Left;  . TEE WITHOUT CARDIOVERSION [name] 04/18/2015   Procedure: TRANSESOPHAGEAL ECHOCARDIOGRAM (TEE);  Surgeon: Delight Ovens, MD;  Location: [facility] OR;  Service: Open Heart Surgery;  Laterality: [name];  . TONSILLECTOMY  1960s    Allergies  Allergen Reactions  . Atorvastatin Other (See Comments)    80 mg dose legs cramps, tolerates 40 mg dose  . Claritin [Loratadine] Other (See Comments)    Makes allergies worse-"a lot worse"  . Codeine Nausea And Vomiting  . Vicodin [Hydrocodone-Acetaminophen] Nausea And Vomiting    "pretty much any codeine"    Family History  Problem Relation Age of Onset  . Heart attack Father 44       Deceased at this age  . Cancer Father        Lung  . Diabetes Sister      Social History   Socioeconomic History  .  Marital status: Married    Spouse name: Not on file  . Number of children: Not on file  . Years of education: Not on file  . Highest education level: Not on file  Occupational History  . Not on file  Social Needs  . Financial resource strain: Not on file  . Food insecurity    Worry: Not on file    Inability: Not on file  . Transportation needs    Medical: Not on file    Non-medical: Not on file  Tobacco Use  . Smoking status: Current Some Day Smoker    Packs/day: 1.50    Years: 33.00    Pack years: 49.50    Types: Cigarettes    Last attempt to quit: 04/15/2015    Years since quitting:  3.6  . Smokeless tobacco: Never Used  Substance and Sexual Activity  . Alcohol use: Yes    Alcohol/week: 0.0 standard drinks    Comment: occasionally  . Drug use: Not Currently    Types: Marijuana, Cocaine    Comment: "in my teens"  . Sexual activity: Not on file  Lifestyle  . Physical activity    Days per week: Not on file    Minutes per session: Not on file  . Stress: Not on file  Relationships  . Social Herbalist on phone: Not on file    Gets together: Not on file    Attends religious service: Not on file    Active member of club or organization: Not on file    Attends meetings of clubs or organizations: Not on file    Relationship status: Not on file  . Intimate partner violence    Fear of current or ex partner: Not on file    Emotionally abused: Not on file    Physically abused: Not on file    Forced sexual activity: Not on file  Other Topics Concern  . Not on file  Social History Narrative  . Not on file     PHYSICAL EXAM:  VS: BP (!) 149/87   Ht 5\' 11"  (1.803 m)   Wt 250 lb (113.4 kg)   BMI 34.87 kg/m  Physical Exam Gen: NAD, alert, cooperative with exam, well-appearing ENT: normal lips, normal nasal mucosa,  Eye: normal EOM, normal conjunctiva and lids CV:  no edema, +2 pedal pulses   Resp: no accessory muscle use, non-labored,  Skin: no rashes, no areas of induration  Neuro: normal tone, normal sensation to touch Psych:  normal insight, alert and oriented MSK:  Back/left hip: No tenderness to palpation of the greater trochanter. No tenderness palpation over the left paraspinal muscles. Normal internal and external rotation of the hip. Normal strength resistance with hip flexion, knee flexion extension. Positive straight leg raise. Neurovascular intact     ASSESSMENT & PLAN:   Lumbar radiculopathy Symptoms seem most consistent with radiculopathy.  History of surgery with prior symptoms on the same side. -Prednisone. -Counseled on  home exercise therapy and supportive care. - If no improvement consider physical therapy or gabapentin.

## 2019-01-15 ENCOUNTER — Ambulatory Visit: Payer: BLUE CROSS/BLUE SHIELD | Admitting: Family Medicine

## 2019-02-24 ENCOUNTER — Other Ambulatory Visit: Payer: Self-pay | Admitting: Cardiology

## 2019-02-24 DIAGNOSIS — I10 Essential (primary) hypertension: Secondary | ICD-10-CM

## 2019-02-24 DIAGNOSIS — I2510 Atherosclerotic heart disease of native coronary artery without angina pectoris: Secondary | ICD-10-CM

## 2019-02-24 DIAGNOSIS — Z79899 Other long term (current) drug therapy: Secondary | ICD-10-CM

## 2019-02-24 DIAGNOSIS — I2583 Coronary atherosclerosis due to lipid rich plaque: Secondary | ICD-10-CM

## 2019-02-24 DIAGNOSIS — E785 Hyperlipidemia, unspecified: Secondary | ICD-10-CM

## 2019-02-24 DIAGNOSIS — Z951 Presence of aortocoronary bypass graft: Secondary | ICD-10-CM

## 2019-03-31 ENCOUNTER — Other Ambulatory Visit: Payer: Self-pay | Admitting: Urology

## 2019-05-01 ENCOUNTER — Other Ambulatory Visit: Payer: Self-pay | Admitting: Urology

## 2019-07-02 DIAGNOSIS — M25562 Pain in left knee: Secondary | ICD-10-CM | POA: Diagnosis not present

## 2019-07-02 DIAGNOSIS — M170 Bilateral primary osteoarthritis of knee: Secondary | ICD-10-CM | POA: Diagnosis not present

## 2019-07-02 DIAGNOSIS — M25552 Pain in left hip: Secondary | ICD-10-CM | POA: Diagnosis not present

## 2019-07-06 ENCOUNTER — Other Ambulatory Visit: Payer: Self-pay

## 2019-07-06 ENCOUNTER — Ambulatory Visit: Payer: BLUE CROSS/BLUE SHIELD | Admitting: Cardiology

## 2019-07-06 ENCOUNTER — Encounter: Payer: Self-pay | Admitting: Cardiology

## 2019-07-06 VITALS — BP 144/84 | HR 61 | Ht 71.0 in | Wt 250.6 lb

## 2019-07-06 DIAGNOSIS — Z720 Tobacco use: Secondary | ICD-10-CM | POA: Diagnosis not present

## 2019-07-06 DIAGNOSIS — I2510 Atherosclerotic heart disease of native coronary artery without angina pectoris: Secondary | ICD-10-CM

## 2019-07-06 DIAGNOSIS — I25110 Atherosclerotic heart disease of native coronary artery with unstable angina pectoris: Secondary | ICD-10-CM

## 2019-07-06 DIAGNOSIS — I480 Paroxysmal atrial fibrillation: Secondary | ICD-10-CM | POA: Diagnosis not present

## 2019-07-06 DIAGNOSIS — I10 Essential (primary) hypertension: Secondary | ICD-10-CM | POA: Diagnosis not present

## 2019-07-06 DIAGNOSIS — E785 Hyperlipidemia, unspecified: Secondary | ICD-10-CM

## 2019-07-06 DIAGNOSIS — F411 Generalized anxiety disorder: Secondary | ICD-10-CM | POA: Diagnosis not present

## 2019-07-06 DIAGNOSIS — N401 Enlarged prostate with lower urinary tract symptoms: Secondary | ICD-10-CM | POA: Diagnosis not present

## 2019-07-06 DIAGNOSIS — I2583 Coronary atherosclerosis due to lipid rich plaque: Secondary | ICD-10-CM

## 2019-07-06 LAB — COMPREHENSIVE METABOLIC PANEL
ALT: 29 IU/L (ref 0–44)
AST: 22 IU/L (ref 0–40)
Albumin/Globulin Ratio: 1.5 (ref 1.2–2.2)
Albumin: 4.6 g/dL (ref 3.8–4.9)
Alkaline Phosphatase: 116 IU/L (ref 39–117)
BUN/Creatinine Ratio: 16 (ref 9–20)
BUN: 13 mg/dL (ref 6–24)
Bilirubin Total: 0.4 mg/dL (ref 0.0–1.2)
CO2: 26 mmol/L (ref 20–29)
Calcium: 9.4 mg/dL (ref 8.7–10.2)
Chloride: 99 mmol/L (ref 96–106)
Creatinine, Ser: 0.83 mg/dL (ref 0.76–1.27)
GFR calc Af Amer: 114 mL/min/{1.73_m2} (ref >59)
GFR calc non Af Amer: 98 mL/min/{1.73_m2} (ref >59)
Globulin, Total: 3 g/dL (ref 1.5–4.5)
Glucose: 105 mg/dL — ABNORMAL HIGH (ref 65–99)
Potassium: 3.7 mmol/L (ref 3.5–5.2)
Sodium: 140 mmol/L (ref 134–144)
Total Protein: 7.6 g/dL (ref 6.0–8.5)

## 2019-07-06 LAB — CBC
Hematocrit: 45.7 % (ref 37.5–51.0)
Hemoglobin: 16.2 g/dL (ref 13.0–17.7)
MCH: 30.5 pg (ref 26.6–33.0)
MCHC: 35.4 g/dL (ref 31.5–35.7)
MCV: 86 fL (ref 79–97)
Platelets: 169 10*3/uL (ref 150–450)
RBC: 5.32 x10E6/uL (ref 4.14–5.80)
RDW: 13.1 % (ref 11.6–15.4)
WBC: 9.4 10*3/uL (ref 3.4–10.8)

## 2019-07-06 LAB — HEPATIC FUNCTION PANEL: Bilirubin, Direct: 0.16 mg/dL (ref 0.00–0.40)

## 2019-07-06 LAB — TSH: TSH: 2.85 u[IU]/mL (ref 0.450–4.500)

## 2019-07-06 NOTE — Progress Notes (Signed)
Cardiology Clinic Note    Date:  07/06/2019   ID:  [name], DOB 05-Apr-1965, MRN [id number]  Patient Location: Home Provider Location: Home  PCP:  Marda Stalker, PA-C  Cardiologist:  Ena Dawley, MD  Electrophysiologist:  None   Evaluation Performed:  Follow-Up Visit  Reason for Visit/CC: f/u for CAD   History of Present Illness:    [name] is a 54 y.o. male with h/o CAD s/p CABG x 4 04/18/2015 by Dr. Servando Snare (LIMA to LAD, Left radial artery to OM, SVG to Intermediate, and SVG to PD), post operative atrial fibrillation managed with amiodarone, HTN, depression and anxiety. 1 month after his CABG, he was readmitted 04/2015 for recurrent CP and hypertensive urgency. Enzyme elevation was c/w NSTEMI. Subsequently, he underwent repeat [facility] which demonstrated occlusion of the SVG to the ramus intermediate. The native vessel does have moderate ostial stenosis but has TIMI 3 flow without other obstructive disease. It is possible that with occlusion of the SVG there may have been some thrombus showered downstream. His remaining 3 grafts were patent. Medical management was recommended. He was continued on ASA, a statin, BB and ARB. Plavix and a long acting nitrate were added to his regimen. He was also prescribed PRN SL NTG. He did well with medical therapy.  07/06/2019 -the patient is coming after 6 months, he has been doing great, he continues to work full-time and denies any symptoms of chest pain shortness of breath dizziness or syncope.  He has been compliant with his medications and has no side effects.  The patient does not have symptoms concerning for COVID-19 infection (fever, chills, cough, or new shortness of breath).    Past Medical History:  Diagnosis Date  . Anxiety   . Depression   . Hyperlipidemia   . Hypertension   . Lumbar herniated disc dx'd 03/2015  . NSTEMI (non-ST elevated myocardial infarction) ([place]) 04/15/2015   Past Surgical History:  Procedure  Laterality Date  . BACK SURGERY    . CARDIAC CATHETERIZATION  04/15/2015  . CARDIAC CATHETERIZATION [name] 04/15/2015   Procedure: Left Heart Cath and Coronary Angiography;  Surgeon: Lorretta Harp, MD;  Location: [place] CV LAB;  Service: Cardiovascular;  Laterality: [name];  . CARDIAC CATHETERIZATION [name] 05/20/2015   Procedure: Left Heart Cath and Cors/Grafts Angiography;  Surgeon: Peter M Martinique, MD;  Location: [place] CV LAB;  Service: Cardiovascular;  Laterality: [name];  . CORONARY ARTERY BYPASS GRAFT [name] 04/18/2015   Procedure: CORONARY ARTERY BYPASS GRAFTING times four with LIMA  to LAD, LEFT RADIAL ARTERY to OM, Right saphenous vein harvesting andutilized to Interm and to PD [place] RIGHT THIGH;  Surgeon: Grace Isaac, MD;  Location: [facility];  Service: Open Heart Surgery;  Laterality: [name];  . LUMBAR LAMINECTOMY  2002  . RADIAL ARTERY HARVEST Left 04/18/2015   Procedure:  LEFT Arm RADIAL ARTERY HARVEST;  Surgeon: Grace Isaac, MD;  Location: [place];  Service: Open Heart Surgery;  Laterality: Left;  . TEE WITHOUT CARDIOVERSION [name] 04/18/2015   Procedure: TRANSESOPHAGEAL ECHOCARDIOGRAM (TEE);  Surgeon: Grace Isaac, MD;  Location: [place];  Service: Open Heart Surgery;  Laterality: [name];  . TONSILLECTOMY  1960s     Current Meds  Medication Sig  . alfuzosin (UROXATRAL) 10 MG 24 hr tablet TAKE 1 TABLET BY MOUTH EVERYDAY AT BEDTIME  . amLODipine (NORVASC) 10 MG tablet Take 1 tablet (10 mg total) by mouth daily.  Marland Kitchen aspirin  EC 81 MG EC tablet Take 1 tablet (81 mg total) by mouth daily.  Marland Kitchen atorvastatin (LIPITOR) 40 MG tablet Take 1 tablet (40 mg total) by mouth daily. t  . clopidogrel (PLAVIX) 75 MG tablet TAKE 1 TABLET BY MOUTH EVERY DAY  . ezetimibe (ZETIA) 10 MG tablet Take 1 tablet (10 mg total) by mouth daily.  . finasteride (PROSCAR) 5 MG tablet TAKE 1 TABLET BY MOUTH EVERY DAY  . FLUoxetine (PROZAC) 20 MG tablet Take 30 mg by mouth daily.    . hydrochlorothiazide (HYDRODIURIL) 25 MG tablet Take 1 tablet (25 mg total) by mouth daily.  . isosorbide mononitrate (IMDUR) 60 MG 24 hr tablet Take 1 tablet (60 mg total) by mouth daily.  Marland Kitchen lisinopril (ZESTRIL) 40 MG tablet Take 1 tablet (40 mg total) by mouth daily.  Marland Kitchen MYRBETRIQ 25 MG TB24 tablet TAKE 1 TABLET BY MOUTH EVERY DAY  . nebivolol (BYSTOLIC) 10 MG tablet Take 1 tablet (10 mg total) by mouth daily.  . nitroGLYCERIN (NITROSTAT) 0.4 MG SL tablet Place 1 tablet (0.4 mg total) under the tongue every 5 (five) minutes x 3 doses as needed for chest pain.  . Omega-3 Fatty Acids (FISH OIL) 1000 MG CAPS Take 1,000 mg by mouth 2 (two) times daily.  . pantoprazole (PROTONIX) 40 MG tablet Take 1 tablet (40 mg total) by mouth daily.  . potassium chloride (KLOR-CON M10) 10 MEQ tablet Take 1 tablet (10 mEq total) by mouth daily.     Allergies:   Atorvastatin, Claritin [loratadine], Codeine, and Vicodin [hydrocodone-acetaminophen]   Social History   Tobacco Use  . Smoking status: Current Some Day Smoker    Packs/day: 1.50    Years: 33.00    Pack years: 49.50    Types: Cigarettes    Last attempt to quit: 04/15/2015    Years since quitting: 4.2  . Smokeless tobacco: Never Used  Substance Use Topics  . Alcohol use: Yes    Alcohol/week: 0.0 standard drinks    Comment: occasionally  . Drug use: Not Currently    Types: Marijuana, Cocaine    Comment: "in my teens"     Family Hx: The patient's family history includes Cancer in his father; Diabetes in his sister; Heart attack (age of onset: 47) in his father.  ROS:   Please see the history of present illness.    All other systems reviewed and are negative.   Prior CV studies:   The following studies were reviewed today:  TTE: 04/2015  - Left ventricle: The cavity size was normal. Wall thickness was   increased in a pattern of mild LVH. Basal inferior severe   hypokinesis, inferolateral hypokinesis. Systolic function was low    normal to mildly reduced. The estimated ejection fraction was in   the range of 50% to 55%. Doppler parameters are consistent with   abnormal left ventricular relaxation (grade 1 diastolic   dysfunction). - Aortic valve: There was no stenosis. - Ascending aorta: The ascending aorta was mildly dilated at 3.7   cm. - Mitral valve: There was no significant regurgitation. - Left atrium: The atrium was mildly dilated. - Right ventricle: Poorly visualized. - Pulmonary arteries: No complete TR doppler jet so unable to   estimate PA systolic pressure. - Inferior vena cava: The vessel was normal in size. The   respirophasic diameter changes were in the normal range (>= 50%),   consistent with normal central venous pressure. - Pericardium, extracardiac: A trivial pericardial effusion was  identified.  Labs/Other Tests and Data Reviewed:    EKG:  No ECG reviewed.  Recent Labs: No results found for requested labs within last 8760 hours.   Recent Lipid Panel Lab Results  Component Value Date/Time   CHOL 136 12/12/2017 08:21 AM   TRIG 189 (H) 12/12/2017 08:21 AM   HDL 35 (L) 12/12/2017 08:21 AM   CHOLHDL 3.9 12/12/2017 08:21 AM   CHOLHDL 3.8 05/20/2015 05:14 AM   LDLCALC 63 12/12/2017 08:21 AM    Wt Readings from Last 3 Encounters:  07/06/19 250 lb 9.6 oz (113.7 kg)  12/18/18 250 lb (113.4 kg)  11/13/18 250 lb (113.4 kg)     Objective:    Vital Signs:  BP (!) 144/84   Pulse 61   Ht 5' 11"  (1.803 m)   Wt 250 lb 9.6 oz (113.7 kg)   SpO2 97%   BMI 34.95 kg/m    VITAL SIGNS:  reviewed     ASSESSMENT & PLAN:    1. CAD: s/p NSTEMI in 03/2014 treated with CABG x 4. He had a second NSTEMI 04/2015 with cath revealing a failed SVG to RI. His other 3 grafts were patent.  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  The patient is completely asymptomatic, compliant with his medications and able to  tolerate them, he has changed his diet lost a lot of weight and feels great, he works as a Nature conservation officer and has no symptoms.  No ischemic work-up is required right now.  Continue the same meds.  No bleeding with dual antiplatelet therapy.  2. Post operative atrial fibrillation: occurred post CABG and managed with short term amio.  No recurrence since his bypass surgery.  3. HTN: Borderline controlled.  Advised to watch for excessive salt intake.  4. Hyperlipidemia: on Lipitor, Zetia and fish oil, he has changed diet completely his LDL is excellent, mildly elevated triglycerides however he has changed diet since then.  We will repeat lipids today.  5. Tobacco Abuse:  He has occasional cigarette.   COVID-19 Education: The signs and symptoms of COVID-19 were discussed with the patient and how to seek care for testing (follow up with PCP or arrange E-visit).  The importance of social distancing was discussed today.  Time:   Today, I have spent 25 minutes with the patient with telehealth technology discussing the above problems.    Medication Adjustments/Labs and Tests Ordered: Current medicines are reviewed at length with the patient today.  Concerns regarding medicines are outlined above.   Tests Ordered: Orders Placed This Encounter  Procedures  . CBC  . TSH  . Hepatic function panel  . Comp Met (CMET)  . EKG 12-Lead   Medication Changes: No orders of the defined types were placed in this encounter.  Follow Up:  In Person in 6 month(s)  Signed, Ena Dawley, MD  07/06/2019 8:44 AM    [place]

## 2019-07-06 NOTE — Patient Instructions (Signed)
Medication Instructions:  Your physician recommends that you continue on your current medications as directed. Please refer to the Current Medication list given to you today.  *If you need a refill on your cardiac medications before your next appointment, please call your pharmacy*   Lab Work: TODAY CBC,CMP,LIPID AND TSH  If you have labs (blood work) drawn today and your tests are completely normal, you will receive your results only by: Marland Kitchen MyChart Message (if you have MyChart) OR . A paper copy in the mail If you have any lab test that is abnormal or we need to change your treatment, we will call you to review the results.   Testing/Procedures:  Your physician has requested that you have an echocardiogram. Echocardiography is a painless test that uses sound waves to create images of your heart. It provides your doctor with information about the size and shape of your heart and how well your heart's chambers and valves are working. This procedure takes approximately one hour. There are no restrictions for this procedure.     Follow-Up: At [facility], you and your health needs are our priority.  As part of our continuing mission to provide you with exceptional heart care, we have created designated Provider Care Teams.  These Care Teams include your primary Cardiologist (physician) and Advanced Practice Providers (APPs -  Physician Assistants and Nurse Practitioners) who all work together to provide you with the care you need, when you need it.  We recommend signing up for the patient portal called "MyChart".  Sign up information is provided on this After Visit Summary.  MyChart is used to connect with patients for Virtual Visits (Telemedicine).  Patients are able to view lab/test results, encounter notes, upcoming appointments, etc.  Non-urgent messages can be sent to your provider as well.   To learn more about what you can do with MyChart, go to [email or web address].    Your next  appointment:   6 month(s)  The format for your next appointment:   In Person  Provider:   You may see Tobias Alexander, MD  6 MONTHS  or one of the following Advanced Practice Providers on your designated Care Team:    Ronie Spies, PA-C  Jacolyn Reedy, PA-C

## 2019-07-18 ENCOUNTER — Ambulatory Visit (HOSPITAL_COMMUNITY): Payer: BLUE CROSS/BLUE SHIELD | Attending: Cardiovascular Disease

## 2019-07-18 ENCOUNTER — Other Ambulatory Visit: Payer: Self-pay

## 2019-07-18 DIAGNOSIS — I2510 Atherosclerotic heart disease of native coronary artery without angina pectoris: Secondary | ICD-10-CM | POA: Diagnosis not present

## 2019-07-18 DIAGNOSIS — I2583 Coronary atherosclerosis due to lipid rich plaque: Secondary | ICD-10-CM | POA: Diagnosis not present

## 2019-07-18 DIAGNOSIS — I480 Paroxysmal atrial fibrillation: Secondary | ICD-10-CM | POA: Diagnosis not present

## 2019-08-01 ENCOUNTER — Other Ambulatory Visit: Payer: Self-pay | Admitting: Urology

## 2019-08-06 ENCOUNTER — Other Ambulatory Visit: Payer: Self-pay | Admitting: Cardiology

## 2019-08-06 DIAGNOSIS — E785 Hyperlipidemia, unspecified: Secondary | ICD-10-CM

## 2019-08-06 DIAGNOSIS — I2583 Coronary atherosclerosis due to lipid rich plaque: Secondary | ICD-10-CM

## 2019-08-06 DIAGNOSIS — I10 Essential (primary) hypertension: Secondary | ICD-10-CM

## 2019-08-06 DIAGNOSIS — Z79899 Other long term (current) drug therapy: Secondary | ICD-10-CM

## 2019-08-06 DIAGNOSIS — Z951 Presence of aortocoronary bypass graft: Secondary | ICD-10-CM

## 2019-08-13 DIAGNOSIS — M222X2 Patellofemoral disorders, left knee: Secondary | ICD-10-CM | POA: Diagnosis not present

## 2019-08-13 DIAGNOSIS — M222X1 Patellofemoral disorders, right knee: Secondary | ICD-10-CM | POA: Diagnosis not present

## 2019-08-13 DIAGNOSIS — M1612 Unilateral primary osteoarthritis, left hip: Secondary | ICD-10-CM | POA: Diagnosis not present

## 2019-08-18 ENCOUNTER — Other Ambulatory Visit: Payer: Self-pay | Admitting: Cardiology

## 2019-08-18 DIAGNOSIS — E785 Hyperlipidemia, unspecified: Secondary | ICD-10-CM

## 2019-08-18 DIAGNOSIS — I2583 Coronary atherosclerosis due to lipid rich plaque: Secondary | ICD-10-CM

## 2019-08-18 DIAGNOSIS — Z79899 Other long term (current) drug therapy: Secondary | ICD-10-CM

## 2019-08-18 DIAGNOSIS — I10 Essential (primary) hypertension: Secondary | ICD-10-CM

## 2019-08-18 DIAGNOSIS — Z951 Presence of aortocoronary bypass graft: Secondary | ICD-10-CM

## 2019-08-29 DIAGNOSIS — M1612 Unilateral primary osteoarthritis, left hip: Secondary | ICD-10-CM | POA: Diagnosis not present

## 2019-10-03 ENCOUNTER — Other Ambulatory Visit: Payer: Self-pay | Admitting: Cardiology

## 2019-10-03 DIAGNOSIS — Z951 Presence of aortocoronary bypass graft: Secondary | ICD-10-CM

## 2019-10-03 DIAGNOSIS — Z79899 Other long term (current) drug therapy: Secondary | ICD-10-CM

## 2019-10-03 DIAGNOSIS — I2583 Coronary atherosclerosis due to lipid rich plaque: Secondary | ICD-10-CM

## 2019-10-03 DIAGNOSIS — I10 Essential (primary) hypertension: Secondary | ICD-10-CM

## 2019-10-03 DIAGNOSIS — E785 Hyperlipidemia, unspecified: Secondary | ICD-10-CM

## 2019-10-03 MED ORDER — NEBIVOLOL HCL 10 MG PO TABS: 10.0000 mg | ORAL_TABLET | Freq: Every day | ORAL | 2 refills | Status: DC

## 2019-10-30 ENCOUNTER — Telehealth: Payer: Self-pay

## 2019-10-30 NOTE — Progress Notes (Deleted)
Cardiology Office Note    Date:  10/30/2019   ID:  [name], DOB 1965/07/31, MRN [id number]  PCP:  Jarrett Soho, PA-C  Cardiologist: Tobias Alexander, MD EPS: None  No chief complaint on file.   History of Present Illness:  [name] is a 54 y.o. male with h/o CAD s/p CABG x 4 04/18/2015 by Dr. Tyrone Sage (LIMA to LAD, Left radial artery to OM, SVG to Intermediate, and SVG to PD), post operative atrial fibrillation managed with amiodarone, HTN, depression and anxiety. 1 month after his CABG, he was readmitted 04/2015 for recurrent CP and hypertensive urgency. Enzyme elevation was c/w NSTEMI. Subsequently, he underwent repeat [facility] which demonstrated occlusion of the SVG to the ramus intermediate. The native vessel does have moderate ostial stenosis but has TIMI 3 flow without other obstructive disease. It is possible that with occlusion of the SVG there may have been some thrombus showered downstream. His remaining 3 grafts were patent. Medical management was recommended. He was continued on ASA, a statin, BB and ARB. Plavix and a long acting nitrate were added to his regimen.   Patient last seen by Dr. Delton See 07/06/2019 and was doing well     Past Medical History:  Diagnosis Date  . Anxiety   . Depression   . Hyperlipidemia   . Hypertension   . Lumbar herniated disc dx'd 03/2015  . NSTEMI (non-ST elevated myocardial infarction) (HCC) 04/15/2015    Past Surgical History:  Procedure Laterality Date  . BACK SURGERY    . CARDIAC CATHETERIZATION  04/15/2015  . CARDIAC CATHETERIZATION [name] 04/15/2015   Procedure: Left Heart Cath and Coronary Angiography;  Surgeon: Runell Gess, MD;  Location: [facility] INVASIVE CV LAB;  Service: Cardiovascular;  Laterality: [name];  . CARDIAC CATHETERIZATION [name] 05/20/2015   Procedure: Left Heart Cath and Cors/Grafts Angiography;  Surgeon: Peter M Swaziland, MD;  Location: [facility] INVASIVE CV LAB;  Service: Cardiovascular;  Laterality: [name];  . CORONARY  ARTERY BYPASS GRAFT [name] 04/18/2015   Procedure: CORONARY ARTERY BYPASS GRAFTING times four with LIMA  to LAD, LEFT RADIAL ARTERY to OM, Right saphenous vein harvesting andutilized to Interm and to PD ENDOSCOPIC GREATER SAPHENOUS VEIN HARVEST (EVH) RIGHT THIGH;  Surgeon: Delight Ovens, MD;  Location: [facility] OR;  Service: Open Heart Surgery;  Laterality: [name];  . LUMBAR LAMINECTOMY  2002  . RADIAL ARTERY HARVEST Left 04/18/2015   Procedure:  LEFT Arm RADIAL ARTERY HARVEST;  Surgeon: Delight Ovens, MD;  Location: [facility] OR;  Service: Open Heart Surgery;  Laterality: Left;  . TEE WITHOUT CARDIOVERSION [name] 04/18/2015   Procedure: TRANSESOPHAGEAL ECHOCARDIOGRAM (TEE);  Surgeon: Delight Ovens, MD;  Location: [facility] OR;  Service: Open Heart Surgery;  Laterality: [name];  . TONSILLECTOMY  1960s    Current Medications: No outpatient medications have been marked as taking for the 10/31/19 encounter (Appointment) with Dyann Kief, PA-C.     Allergies:   Atorvastatin, Claritin [loratadine], Codeine, and Vicodin [hydrocodone-acetaminophen]   Social History   Socioeconomic History  . Marital status: Married    Spouse name: Not on file  . Number of children: Not on file  . Years of education: Not on file  . Highest education level: Not on file  Occupational History  . Not on file  Tobacco Use  . Smoking status: Current Some Day Smoker    Packs/day: 1.50    Years: 33.00    Pack years: 49.50    Types: Cigarettes    Last attempt  to quit: 04/15/2015    Years since quitting: 4.5  . Smokeless tobacco: Never Used  Vaping Use  . Vaping Use: Never used  Substance and Sexual Activity  . Alcohol use: Yes    Alcohol/week: 0.0 standard drinks    Comment: occasionally  . Drug use: Not Currently    Types: Marijuana, Cocaine    Comment: "in my teens"  . Sexual activity: Not on file  Other Topics Concern  . Not on file  Social History Narrative  . Not on file   Social Determinants of Health    Financial Resource Strain:   . Difficulty of Paying Living Expenses:   Food Insecurity:   . Worried About Programme researcher, broadcasting/film/video in the Last Year:   . Barista in the Last Year:   Transportation Needs:   . Freight forwarder (Medical):   Marland Kitchen Lack of Transportation (Non-Medical):   Physical Activity:   . Days of Exercise per Week:   . Minutes of Exercise per Session:   Stress:   . Feeling of Stress :   Social Connections:   . Frequency of Communication with Friends and Family:   . Frequency of Social Gatherings with Friends and Family:   . Attends Religious Services:   . Active Member of Clubs or Organizations:   . Attends Banker Meetings:   Marland Kitchen Marital Status:      Family History:  The patient's ***family history includes Cancer in his father; Diabetes in his sister; Heart attack (age of onset: 17) in his father.   ROS:   Please see the history of present illness.    ROS All other systems reviewed and are negative.   PHYSICAL EXAM:   VS:  There were no vitals taken for this visit.  Physical Exam  GEN: Well nourished, well developed, in no acute distress  HEENT: normal  Neck: no JVD, carotid bruits, or masses Cardiac:RRR; no murmurs, rubs, or gallops  Respiratory:  clear to auscultation bilaterally, normal work of breathing GI: soft, nontender, nondistended, + BS Ext: without cyanosis, clubbing, or edema, Good distal pulses bilaterally MS: no deformity or atrophy  Skin: warm and dry, no rash Neuro:  Alert and Oriented x 3, Strength and sensation are intact Psych: euthymic mood, full affect  Wt Readings from Last 3 Encounters:  07/06/19 250 lb 9.6 oz (113.7 kg)  12/18/18 250 lb (113.4 kg)  11/13/18 250 lb (113.4 kg)      Studies/Labs Reviewed:   EKG:  EKG is*** ordered today.  The ekg ordered today demonstrates ***  Recent Labs: 07/06/2019: ALT 29; BUN 13; Creatinine, Ser 0.83; Hemoglobin 16.2; Platelets 169; Potassium 3.7; Sodium 140; TSH  2.850   Lipid Panel    Component Value Date/Time   CHOL 136 12/12/2017 0821   TRIG 189 (H) 12/12/2017 0821   HDL 35 (L) 12/12/2017 0821   CHOLHDL 3.9 12/12/2017 0821   CHOLHDL 3.8 05/20/2015 0514   VLDL 22 05/20/2015 0514   LDLCALC 63 12/12/2017 0821    Additional studies/ records that were reviewed today include:   2D echo 07/18/2019 IMPRESSIONS     1. Inferior basal hypokinesis . Left ventricular ejection fraction, by  estimation, is 60%. The left ventricle has normal function. The left  ventricle has no regional wall motion abnormalities. Left ventricular  diastolic parameters were normal.   2. Right ventricular systolic function is normal. The right ventricular  size is normal.   3. Left atrial size  was moderately dilated.   4. Right atrial size was moderately dilated.   5. The mitral valve is normal in structure. No evidence of mitral valve  regurgitation. No evidence of mitral stenosis.   6. The aortic valve is normal in structure. Aortic valve regurgitation is  not visualized. No aortic stenosis is present.   7. The inferior vena cava is normal in size with greater than 50%  respiratory variability, suggesting right atrial pressure of 3 mmHg.     TTE: 04/2015   - Left ventricle: The cavity size was normal. Wall thickness was   increased in a pattern of mild LVH. Basal inferior severe   hypokinesis, inferolateral hypokinesis. Systolic function was low   normal to mildly reduced. The estimated ejection fraction was in   the range of 50% to 55%. Doppler parameters are consistent with   abnormal left ventricular relaxation (grade 1 diastolic   dysfunction). - Aortic valve: There was no stenosis. - Ascending aorta: The ascending aorta was mildly dilated at 3.7   cm. - Mitral valve: There was no significant regurgitation. - Left atrium: The atrium was mildly dilated. - Right ventricle: Poorly visualized. - Pulmonary arteries: No complete TR doppler jet so unable  to   estimate PA systolic pressure. - Inferior vena cava: The vessel was normal in size. The   respirophasic diameter changes were in the normal range (>= 50%),   consistent with normal central venous pressure. - Pericardium, extracardiac: A trivial pericardial effusion was   identified.     ASSESSMENT:    No diagnosis found.   PLAN:  In order of problems listed above:   CAD: s/p NSTEMI in 03/2014 treated with CABG x 4. He had a second NSTEMI 04/2015 with cath revealing a failed SVG to RI. His other 3 grafts were patent.  LVEF low normal in 2017 with mild regional wall motion abnormalities, f/u echo 07/18/19 LVEF 50-55%   Post op Afib no recurrence  HTN  HLD   Tobacco abuse     Medication Adjustments/Labs and Tests Ordered: Current medicines are reviewed at length with the patient today.  Concerns regarding medicines are outlined above.  Medication changes, Labs and Tests ordered today are listed in the Patient Instructions below. There are no Patient Instructions on file for this visit.   Elson Clan, PA-C  10/30/2019 3:32 PM    [facility] [address] [place], [place], [place]  [address] Phone: [phone]; Fax: [phone]

## 2019-10-30 NOTE — Telephone Encounter (Signed)
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 He was just seen in [place] by Dr Delton See and thinks a copy of that office visit is all he needs to be cleared. I left a copy of the last o/v and his Echo at the front desk for him to pick up tomorrow morning. He will look over those and let me know if he needs to keep his appt or not.

## 2019-10-31 ENCOUNTER — Ambulatory Visit: Payer: BLUE CROSS/BLUE SHIELD | Admitting: Physician Assistant

## 2019-11-11 ENCOUNTER — Other Ambulatory Visit: Payer: Self-pay | Admitting: Cardiology

## 2019-11-18 ENCOUNTER — Other Ambulatory Visit: Payer: Self-pay | Admitting: Cardiology

## 2019-12-13 ENCOUNTER — Other Ambulatory Visit: Payer: Self-pay | Admitting: Cardiology

## 2019-12-13 DIAGNOSIS — I10 Essential (primary) hypertension: Secondary | ICD-10-CM

## 2019-12-13 DIAGNOSIS — Z79899 Other long term (current) drug therapy: Secondary | ICD-10-CM

## 2019-12-13 DIAGNOSIS — Z951 Presence of aortocoronary bypass graft: Secondary | ICD-10-CM

## 2019-12-13 DIAGNOSIS — E785 Hyperlipidemia, unspecified: Secondary | ICD-10-CM

## 2019-12-13 DIAGNOSIS — I2510 Atherosclerotic heart disease of native coronary artery without angina pectoris: Secondary | ICD-10-CM

## 2020-02-05 ENCOUNTER — Other Ambulatory Visit: Payer: Self-pay | Admitting: Cardiology

## 2020-02-05 DIAGNOSIS — E785 Hyperlipidemia, unspecified: Secondary | ICD-10-CM

## 2020-02-05 DIAGNOSIS — I2583 Coronary atherosclerosis due to lipid rich plaque: Secondary | ICD-10-CM

## 2020-02-05 DIAGNOSIS — I10 Essential (primary) hypertension: Secondary | ICD-10-CM

## 2020-02-05 DIAGNOSIS — H25813 Combined forms of age-related cataract, bilateral: Secondary | ICD-10-CM | POA: Diagnosis not present

## 2020-02-05 DIAGNOSIS — I2510 Atherosclerotic heart disease of native coronary artery without angina pectoris: Secondary | ICD-10-CM

## 2020-02-05 DIAGNOSIS — Z951 Presence of aortocoronary bypass graft: Secondary | ICD-10-CM

## 2020-02-05 DIAGNOSIS — Z79899 Other long term (current) drug therapy: Secondary | ICD-10-CM

## 2020-02-18 DIAGNOSIS — N401 Enlarged prostate with lower urinary tract symptoms: Secondary | ICD-10-CM | POA: Diagnosis not present

## 2020-02-18 DIAGNOSIS — Z720 Tobacco use: Secondary | ICD-10-CM | POA: Diagnosis not present

## 2020-02-18 DIAGNOSIS — I2510 Atherosclerotic heart disease of native coronary artery without angina pectoris: Secondary | ICD-10-CM | POA: Diagnosis not present

## 2020-02-18 DIAGNOSIS — I10 Essential (primary) hypertension: Secondary | ICD-10-CM | POA: Diagnosis not present

## 2020-02-18 DIAGNOSIS — F411 Generalized anxiety disorder: Secondary | ICD-10-CM | POA: Diagnosis not present

## 2020-02-19 DIAGNOSIS — H527 Unspecified disorder of refraction: Secondary | ICD-10-CM | POA: Diagnosis not present

## 2020-02-19 DIAGNOSIS — H47393 Other disorders of optic disc, bilateral: Secondary | ICD-10-CM | POA: Diagnosis not present

## 2020-02-19 DIAGNOSIS — H04129 Dry eye syndrome of unspecified lacrimal gland: Secondary | ICD-10-CM | POA: Diagnosis not present

## 2020-02-27 DIAGNOSIS — R3915 Urgency of urination: Secondary | ICD-10-CM | POA: Diagnosis not present

## 2020-02-27 DIAGNOSIS — N401 Enlarged prostate with lower urinary tract symptoms: Secondary | ICD-10-CM | POA: Diagnosis not present

## 2020-02-27 DIAGNOSIS — R3914 Feeling of incomplete bladder emptying: Secondary | ICD-10-CM | POA: Diagnosis not present

## 2020-05-03 ENCOUNTER — Other Ambulatory Visit: Payer: Self-pay | Admitting: Cardiology

## 2020-05-03 DIAGNOSIS — Z79899 Other long term (current) drug therapy: Secondary | ICD-10-CM

## 2020-05-03 DIAGNOSIS — Z951 Presence of aortocoronary bypass graft: Secondary | ICD-10-CM

## 2020-05-03 DIAGNOSIS — I10 Essential (primary) hypertension: Secondary | ICD-10-CM

## 2020-05-03 DIAGNOSIS — I2583 Coronary atherosclerosis due to lipid rich plaque: Secondary | ICD-10-CM

## 2020-05-03 DIAGNOSIS — E785 Hyperlipidemia, unspecified: Secondary | ICD-10-CM

## 2020-05-03 DIAGNOSIS — I2510 Atherosclerotic heart disease of native coronary artery without angina pectoris: Secondary | ICD-10-CM

## 2020-05-10 ENCOUNTER — Other Ambulatory Visit: Payer: Self-pay | Admitting: Cardiology

## 2020-05-10 DIAGNOSIS — I10 Essential (primary) hypertension: Secondary | ICD-10-CM

## 2020-05-10 DIAGNOSIS — Z79899 Other long term (current) drug therapy: Secondary | ICD-10-CM

## 2020-05-10 DIAGNOSIS — E785 Hyperlipidemia, unspecified: Secondary | ICD-10-CM

## 2020-05-10 DIAGNOSIS — I2510 Atherosclerotic heart disease of native coronary artery without angina pectoris: Secondary | ICD-10-CM

## 2020-05-10 DIAGNOSIS — Z951 Presence of aortocoronary bypass graft: Secondary | ICD-10-CM

## 2020-05-10 DIAGNOSIS — I2583 Coronary atherosclerosis due to lipid rich plaque: Secondary | ICD-10-CM

## 2020-05-11 ENCOUNTER — Other Ambulatory Visit: Payer: Self-pay | Admitting: Urology

## 2020-05-13 ENCOUNTER — Other Ambulatory Visit: Payer: Self-pay | Admitting: Urology

## 2020-05-13 ENCOUNTER — Other Ambulatory Visit: Payer: Self-pay | Admitting: Cardiology

## 2020-05-13 DIAGNOSIS — I10 Essential (primary) hypertension: Secondary | ICD-10-CM

## 2020-05-13 DIAGNOSIS — E785 Hyperlipidemia, unspecified: Secondary | ICD-10-CM

## 2020-05-13 DIAGNOSIS — Z79899 Other long term (current) drug therapy: Secondary | ICD-10-CM

## 2020-05-13 DIAGNOSIS — I2510 Atherosclerotic heart disease of native coronary artery without angina pectoris: Secondary | ICD-10-CM

## 2020-05-13 DIAGNOSIS — I2583 Coronary atherosclerosis due to lipid rich plaque: Secondary | ICD-10-CM

## 2020-05-13 DIAGNOSIS — Z951 Presence of aortocoronary bypass graft: Secondary | ICD-10-CM

## 2020-05-22 ENCOUNTER — Other Ambulatory Visit: Payer: Self-pay | Admitting: Cardiology

## 2020-05-22 DIAGNOSIS — E785 Hyperlipidemia, unspecified: Secondary | ICD-10-CM

## 2020-05-22 DIAGNOSIS — I10 Essential (primary) hypertension: Secondary | ICD-10-CM

## 2020-05-22 DIAGNOSIS — Z79899 Other long term (current) drug therapy: Secondary | ICD-10-CM

## 2020-05-22 DIAGNOSIS — I2583 Coronary atherosclerosis due to lipid rich plaque: Secondary | ICD-10-CM

## 2020-05-22 DIAGNOSIS — Z951 Presence of aortocoronary bypass graft: Secondary | ICD-10-CM

## 2020-05-22 DIAGNOSIS — I2510 Atherosclerotic heart disease of native coronary artery without angina pectoris: Secondary | ICD-10-CM

## 2020-05-23 DIAGNOSIS — N401 Enlarged prostate with lower urinary tract symptoms: Secondary | ICD-10-CM | POA: Diagnosis not present

## 2020-05-23 DIAGNOSIS — R3914 Feeling of incomplete bladder emptying: Secondary | ICD-10-CM | POA: Diagnosis not present

## 2020-05-23 DIAGNOSIS — R3915 Urgency of urination: Secondary | ICD-10-CM | POA: Diagnosis not present

## 2020-05-26 ENCOUNTER — Other Ambulatory Visit: Payer: Self-pay | Admitting: Nurse Practitioner

## 2020-05-26 DIAGNOSIS — S8991XD Unspecified injury of right lower leg, subsequent encounter: Secondary | ICD-10-CM

## 2020-05-26 DIAGNOSIS — T148XXA Other injury of unspecified body region, initial encounter: Secondary | ICD-10-CM

## 2020-05-26 DIAGNOSIS — W11XXXD Fall on and from ladder, subsequent encounter: Secondary | ICD-10-CM

## 2020-05-30 ENCOUNTER — Other Ambulatory Visit: Payer: BLUE CROSS/BLUE SHIELD

## 2020-06-30 NOTE — Progress Notes (Signed)
Cardiology Office Note:    Date:  07/07/2020   ID:  [name], DOB 1965/08/31, MRN [id number]  PCP:  Loreen Freud   [facility]  Cardiologist:  Tobias Alexander, MD  Advanced Practice Provider:  No care team member to display Electrophysiologist:  None   Referring MD: Jarrett Soho, PA-C    History of Present Illness:    [name] is a 55 y.o. male with a hx of CAD s/p CABG x 4 04/18/2015 by Dr. Tyrone Sage (LIMA to LAD, Left radial artery to OM, SVG to Intermediate, and SVG to PD), post operative atrial fibrillation managed with amiodarone, HTN, depression and anxiety who was previously followed by Dr. Delton See who now returns to clinic for follow-up.  Patient has history of CAD s/p CABG in 2017 as detailed above.  1 month after his CABG, he was readmitted 04/2015 for recurrent CP and hypertensive urgency. Enzyme elevation was c/w NSTEMI. Subsequently, he underwent repeat [facility] which demonstrated occlusion of the SVG to the ramus intermediate. The native vessel does have moderate ostial stenosis but has TIMI 3 flow without other obstructive disease. It is possible that with occlusion of the SVG there may have been some thrombus showered downstream. His remaining 3 grafts were patent. Medical management was recommended. He was continued on ASA, a statin, BB and ARB. Plavix and a long acting nitrate were added to his regimen. He was also prescribed PRN SL NTG. He did well with medical therapy.  Last saw Dr. Delton See in 06/2019 where he was doing well without anginal symptoms. Tolerating all medications.   The patient states that he has been feeling more dyspneic with exertion over the past couple of months. Occasional chest pressure with this that was relieved with rest. No lightheadedness, dizziness, palpitations, orthopnea or PND. No known sleep apnea or snoring. No recent COVID. Tolerating medications without issues. Blood pressures running 120/80s at  home. Admits to not being very active. Can walk a mile but would be winded. Admits to gaining about 20lbs over the past year.   Past Medical History:  Diagnosis Date  . Anxiety   . Depression   . Hyperlipidemia   . Hypertension   . Lumbar herniated disc dx'd 03/2015  . NSTEMI (non-ST elevated myocardial infarction) (HCC) 04/15/2015    Past Surgical History:  Procedure Laterality Date  . BACK SURGERY    . CARDIAC CATHETERIZATION  04/15/2015  . CARDIAC CATHETERIZATION [name] 04/15/2015   Procedure: Left Heart Cath and Coronary Angiography;  Surgeon: Runell Gess, MD;  Location: [facility] INVASIVE CV LAB;  Service: Cardiovascular;  Laterality: [name];  . CARDIAC CATHETERIZATION [name] 05/20/2015   Procedure: Left Heart Cath and Cors/Grafts Angiography;  Surgeon: Peter M Swaziland, MD;  Location: [facility] INVASIVE CV LAB;  Service: Cardiovascular;  Laterality: [name];  . CORONARY ARTERY BYPASS GRAFT [name] 04/18/2015   Procedure: CORONARY ARTERY BYPASS GRAFTING times four with LIMA  to LAD, LEFT RADIAL ARTERY to OM, Right saphenous vein harvesting andutilized to Interm and to PD ENDOSCOPIC GREATER SAPHENOUS VEIN HARVEST (EVH) RIGHT THIGH;  Surgeon: Delight Ovens, MD;  Location: [facility] OR;  Service: Open Heart Surgery;  Laterality: [name];  . LUMBAR LAMINECTOMY  2002  . RADIAL ARTERY HARVEST Left 04/18/2015   Procedure:  LEFT Arm RADIAL ARTERY HARVEST;  Surgeon: Delight Ovens, MD;  Location: [facility] OR;  Service: Open Heart Surgery;  Laterality: Left;  . TEE WITHOUT CARDIOVERSION [name] 04/18/2015   Procedure: TRANSESOPHAGEAL ECHOCARDIOGRAM (TEE);  Surgeon:  Delight OvensEdward B Gerhardt, MD;  [facility];  Service: Open Heart Surgery;  Laterality: [name];  . TONSILLECTOMY  1960s    Current Medications: Current Meds  Medication Sig  . alfuzosin (UROXATRAL) 10 MG 24 hr tablet TAKE 1 TABLET BY MOUTH EVERYDAY AT BEDTIME  . aspirin EC 81 MG EC tablet Take 1 tablet (81 mg total) by mouth daily.  . finasteride (PROSCAR) 5 MG tablet TAKE 1  TABLET BY MOUTH EVERY DAY  . FLUoxetine (PROZAC) 20 MG tablet Take 30 mg by mouth daily.  . isosorbide mononitrate (IMDUR) 60 MG 24 hr tablet Take 1.5 tablets (90 mg total) by mouth daily.  Marland Kitchen. levocetirizine (XYZAL) 5 MG tablet Take 5 mg by mouth every evening.  Marland Kitchen. MYRBETRIQ 25 MG TB24 tablet TAKE 1 TABLET BY MOUTH EVERY DAY  . Omega-3 Fatty Acids (FISH OIL) 1000 MG CAPS Take 1,000 mg by mouth 2 (two) times daily.  . pantoprazole (PROTONIX) 40 MG tablet Take 1 tablet (40 mg total) by mouth daily.  . [DISCONTINUED] amLODipine (NORVASC) 10 MG tablet Take 1 tablet (10 mg total) by mouth daily. Please keep upcoming appointment in April 2022 for future refills. Thank you  . [DISCONTINUED] atorvastatin (LIPITOR) 40 MG tablet TAKE 1 TABLET BY MOUTH EVERY DAY  . [DISCONTINUED] clopidogrel (PLAVIX) 75 MG tablet Take 1 tablet (75 mg total) by mouth daily. You need to make a follow up appointment with [facility] for further refills  . [DISCONTINUED] ezetimibe (ZETIA) 10 MG tablet Take 1 tablet (10 mg total) by mouth daily. Pl;ease keep upcoming appointment for future refills. Thank you  . [DISCONTINUED] hydrochlorothiazide (HYDRODIURIL) 25 MG tablet Take 1 tablet (25 mg total) by mouth daily. Please keep upcoming appointment in April 2022 for future refills. Thank you  . [DISCONTINUED] isosorbide mononitrate (IMDUR) 60 MG 24 hr tablet TAKE 1 TABLET BY MOUTH EVERY DAY  . [DISCONTINUED] lisinopril (ZESTRIL) 40 MG tablet TAKE 1 TABLET BY MOUTH EVERY DAY  . [DISCONTINUED] nebivolol (BYSTOLIC) 10 MG tablet TAKE 1 TABLET BY MOUTH EVERY DAY  . [DISCONTINUED] nitroGLYCERIN (NITROSTAT) 0.4 MG SL tablet Place 1 tablet (0.4 mg total) under the tongue every 5 (five) minutes x 3 doses as needed for chest pain.  . [DISCONTINUED] potassium chloride (KLOR-CON M10) 10 MEQ tablet Take 1 tablet (10 mEq total) by mouth daily. Please keep upcoming appointment in April 2022 for future refills. Thank you     Allergies:    Atorvastatin, Claritin [loratadine], Codeine, and Vicodin [hydrocodone-acetaminophen]   Social History   Socioeconomic History  . Marital status: Married    Spouse name: Not on file  . Number of children: Not on file  . Years of education: Not on file  . Highest education level: Not on file  Occupational History  . Not on file  Tobacco Use  . Smoking status: Current Some Day Smoker    Packs/day: 1.50    Years: 33.00    Pack years: 49.50    Types: Cigarettes    Last attempt to quit: 04/15/2015    Years since quitting: 5.2  . Smokeless tobacco: Never Used  Vaping Use  . Vaping Use: Never used  Substance and Sexual Activity  . Alcohol use: Yes    Alcohol/week: 0.0 standard drinks    Comment: occasionally  . Drug use: Not Currently    Types: Marijuana, Cocaine    Comment: "in my teens"  . Sexual activity: Not on file  Other Topics Concern  . Not on file  Social History Narrative  . Not on file   Social Determinants of Health   Financial Resource Strain: Not on file  Food Insecurity: Not on file  Transportation Needs: Not on file  Physical Activity: Not on file  Stress: Not on file  Social Connections: Not on file     Family History: The patient's family history includes Cancer in his father; Diabetes in his sister; Heart attack (age of onset: 23) in his father.  ROS:   Please see the history of present illness.    Review of Systems  Constitutional: Negative for chills and fever.  HENT: Negative for hearing loss.   Eyes: Negative for blurred vision and redness.  Respiratory: Positive for shortness of breath.   Cardiovascular: Positive for chest pain. Negative for palpitations, orthopnea, claudication, leg swelling and PND.  Gastrointestinal: Negative for melena, nausea and vomiting.  Genitourinary: Negative for dysuria.  Musculoskeletal: Negative for falls.  Neurological: Negative for dizziness and loss of consciousness.  Endo/Heme/Allergies: Negative for  polydipsia.  Psychiatric/Behavioral: Negative for depression.    EKGs/Labs/Other Studies Reviewed:    The following studies were reviewed today: TTE 08/16/19: 1. Inferior basal hypokinesis . Left ventricular ejection fraction, by  estimation, is 60%. The left ventricle has normal function. The left  ventricle has no regional wall motion abnormalities. Left ventricular  diastolic parameters were normal.  2. Right ventricular systolic function is normal. The right ventricular  size is normal.  3. Left atrial size was moderately dilated.  4. Right atrial size was moderately dilated.  5. The mitral valve is normal in structure. No evidence of mitral valve  regurgitation. No evidence of mitral stenosis.  6. The aortic valve is normal in structure. Aortic valve regurgitation is  not visualized. No aortic stenosis is present.  7. The inferior vena cava is normal in size with greater than 50%  respiratory variability, suggesting right atrial pressure of 3 mmHg.  TTE: 04/2015 - Left ventricle: The cavity size was normal. Wall thickness was increased in a pattern of mild LVH. Basal inferior severe hypokinesis, inferolateral hypokinesis. Systolic function was low normal to mildly reduced. The estimated ejection fraction was in the range of 50% to 55%. Doppler parameters are consistent with abnormal left ventricular relaxation (grade 1 diastolic dysfunction). - Aortic valve: There was no stenosis. - Ascending aorta: The ascending aorta was mildly dilated at 3.7 cm. - Mitral valve: There was no significant regurgitation. - Left atrium: The atrium was mildly dilated. - Right ventricle: Poorly visualized. - Pulmonary arteries: No complete TR doppler jet so unable to estimate PA systolic pressure. - Inferior vena cava: The vessel was normal in size. The respirophasic diameter changes were in the normal range (>= 50%), consistent with normal central venous  pressure. - Pericardium, extracardiac: A trivial pericardial effusion was identified.  Cath 2017:  Mid Cx lesion, 95% stenosed.  Mid Cx to Dist Cx lesion, 95% stenosed.  Prox LAD to Mid LAD lesion, 50% stenosed.  Ost Ramus lesion, 60% stenosed.  Prox Cx lesion, 95% stenosed.  Prox RCA-1 lesion, 75% stenosed.  Prox RCA-2 lesion, 80% stenosed.  Mid RCA lesion, 90% stenosed.  Dist RCA lesion, 60% stenosed.  RPDA lesion, 60% stenosed.  SVG to the PDA was injected is very large, and is anatomically normal.  There is competitive flow.  SVG to the ramus intermediate was injected is very large.  Mid Graft to Insertion lesion, 100% stenosed.  Radial artery graft to the OM was injected is normal in  caliber, and is anatomically normal.  There is competitive flow.  LIMA to the LAD was injected is normal in caliber, and is anatomically normal.  The left ventricular systolic function is normal.   1. Severe 3 vessel obstructive CAD 2. Patent LIMA to the LAD 3. Patent free radial graft to the OM. This arises from the hood of the SVG to the ramus intermediate branch 4. Occluded mid SVG to the ramus intermediate with thrombus. 5. Patent SVG to the PDA 6. Good LV function.  Plan: It appears that the culprit lesion is occlusion of the SVG to the ramus intermediate. The native vessel does have moderate ostial stenosis but has TIMI 3 flow without other obstructive disease. It is possible that with occlusion of the SVG there may have been some thrombus showered downstream. At this point would treat medically.    EKG:  EKG is  ordered today.  The ekg ordered today demonstrates sinus bradycardia with HR 59  Recent Labs: No results found for requested labs within last 8760 hours.  Recent Lipid Panel    Component Value Date/Time   CHOL 136 12/12/2017 0821   TRIG 189 (H) 12/12/2017 0821   HDL 35 (L) 12/12/2017 0821   CHOLHDL 3.9 12/12/2017 0821   CHOLHDL 3.8 05/20/2015 0514    VLDL 22 05/20/2015 0514   LDLCALC 63 12/12/2017 0821      Physical Exam:    VS:  BP (!) 144/88   Pulse (!) 59   Ht  (1.803 m)   Wt 269 lb 12.8 oz (122.4 kg)   SpO2 95%   BMI 37.63 kg/m     Wt Readings from Last 3 Encounters:  07/07/20 269 lb 12.8 oz (122.4 kg)  07/06/19 250 lb 9.6 oz (113.7 kg)  12/18/18 250 lb (113.4 kg)     GEN:  Well nourished, well developed in no acute distress HEENT: Normal NECK: No JVD; No carotid bruits CARDIAC: Bradycardic, regular, no murmurs, rubs, gallops RESPIRATORY:  Clear to auscultation without rales, wheezing or rhonchi  ABDOMEN: Soft, non-tender, non-distended MUSCULOSKELETAL:  No edema; No deformity  SKIN: Warm and dry NEUROLOGIC:  Alert and oriented x 3 PSYCHIATRIC:  Normal affect   ASSESSMENT:    1. Tobacco abuse disorder   2. Essential (primary) hypertension   3. S/P CABG x 4   4. S/P CABG (coronary artery bypass graft)   5. Coronary artery disease due to lipid rich plaque   6. Encounter for long-term (current) use of high-risk medication   7. Hyperlipidemia, unspecified hyperlipidemia type    PLAN:    In order of problems listed above:  #Multivessel CAD s/p CABG x4 in 2016: #DOE with concern for stable angina Patient with NSTEMI in 03/2014 treated with CABG x 4 as detailed above. He had a second NSTEMI 04/2015 with cath revealing a failed SVG to RI. His other 3 grafts were patent.  LVEF low normal in 2017 with mild regional wall motion abnormalities now with LVEF 60% on TTE 06/2019 with inferobasal hypokinesis. Now presenting with worsening DOE and chest pressure over the past several months concerning for anginal equivalent. -Check myoview given worsening DOE and chest pressure -Continue ASA  daily; plavix  daily -Continue lipitor  daily and zetia  daily -Continue lisinopril  daily -Continue bystolic  daily -Increase imdur to  daily  #Post operative atrial fibrillation: Occurred post  CABG and managed with short term amio. No recurrence since his bypass surgery. -Continue to monitor--off AC  #HTN: -  Continue lisinopril  daily -Continue bystolic  daily -Continue amlodipine  daily -Increase imdur to    #Mixed Hyperlipidemia: Last LDL 02/18/20 66. TG 273 -Continue lipitor  daily and zetia  daily -Repeat lipid panel today and if TG still elevated, will start vascepa -Continue healthy lifestyle modifications--discussed at length today  #Tobacco Abuse:  He has occasional cigarette.  -Encourage complete cessation -Wants to try nicotine patches -Will check screening lung cancer CT due to >40 pack year smoking history  #Obesity: BMI 37. -Discussed healthy diet and exercise at length -Will look into ozempic/wegivy  Exercise recommendations: Goal of exercising for at least 30 minutes a day, at least 5 times per week.  Please exercise to a moderate exertion.  This means that while exercising it is difficult to speak in full sentences, however you are not so short of breath that you feel you must stop, and not so comfortable that you can carry on a full conversation.  Exertion level should be approximately a 5/10, if 10 is the most exertion you can perform.  Diet recommendations: Recommend a heart healthy diet such as the Mediterranean diet.  This diet consists of plant based foods, healthy fats, lean meats, olive oil.  It suggests limiting the intake of simple carbohydrates such as white breads, pastries, and pastas.  It also limits the amount of red meat, wine, and dairy products such as cheese that one should consume on a daily basis.   Shared Decision Making/Informed Consent The risks [chest pain, shortness of breath, cardiac arrhythmias, dizziness, blood pressure fluctuations, myocardial infarction, stroke/transient ischemic attack, nausea, vomiting, allergic reaction, radiation exposure, metallic taste sensation and life-threatening  complications (estimated to be 1 in 10,000)], benefits (risk stratification, diagnosing coronary artery disease, treatment guidance) and alternatives of a nuclear stress test were discussed in detail with Mr. Morella and he agrees to proceed.   Medication Adjustments/Labs and Tests Ordered: Current medicines are reviewed at length with the patient today.  Concerns regarding medicines are outlined above.  Orders Placed This Encounter  Procedures  . CT CHEST LUNG CA SCREEN LOW DOSE W/O CM  . Lipid Profile  . MYOCARDIAL PERFUSION IMAGING  . EKG 12-Lead   Meds ordered this encounter  Medications  . isosorbide mononitrate (IMDUR) 60 MG 24 hr tablet    Sig: Take 1.5 tablets (90 mg total) by mouth daily.    Dispense:  135 tablet    Refill:  3  . potassium chloride (KLOR-CON M10) 10 MEQ tablet    Sig: Take 1 tablet (10 mEq total) by mouth daily. Please keep upcoming appointment in April 2022 for future refills. Thank you    Dispense:  90 tablet    Refill:  3  . nitroGLYCERIN (NITROSTAT) 0.4 MG SL tablet    Sig: Place 1 tablet (0.4 mg total) under the tongue every 5 (five) minutes x 3 doses as needed for chest pain.    Dispense:  25 tablet    Refill:  2  . nebivolol (BYSTOLIC) 10 MG tablet    Sig: Take 1 tablet (10 mg total) by mouth daily.    Dispense:  90 tablet    Refill:  3  . lisinopril (ZESTRIL) 40 MG tablet    Sig: Take 1 tablet (40 mg total) by mouth daily.    Dispense:  90 tablet    Refill:  3  . hydrochlorothiazide (HYDRODIURIL) 25 MG tablet    Sig: Take 1 tablet (25 mg total) by mouth daily.  Dispense:  90 tablet    Refill:  3  . ezetimibe (ZETIA) 10 MG tablet    Sig: Take 1 tablet (10 mg total) by mouth daily.    Dispense:  90 tablet    Refill:  3  . clopidogrel (PLAVIX) 75 MG tablet    Sig: Take 1 tablet (75 mg total) by mouth daily.    Dispense:  90 tablet    Refill:  3  . atorvastatin (LIPITOR) 40 MG tablet    Sig: Take 1 tablet (40 mg total) by mouth daily.     Dispense:  90 tablet    Refill:  3  . amLODipine (NORVASC) 10 MG tablet    Sig: Take 1 tablet (10 mg total) by mouth daily.    Dispense:  90 tablet    Refill:  3    Patient Instructions   Medication Instructions:  Your physician has recommended you make the following change in your medication:  INCREASE Isosorbide mononitrate (Imdur) to 90 mg daily  *If you need a refill on your cardiac medications before your next appointment, please call your pharmacy*   Lab Work: TODAY - Cholesterol (lipid profile) If you have labs (blood work) drawn today and your tests are completely normal, you will receive your results only by: Marland Kitchen MyChart Message (if you have MyChart) OR . A paper copy in the mail If you have any lab test that is abnormal or we need to change your treatment, we will call you to review the results.   Testing/Procedures: Your physician has requested that you have a lexiscan myoview. For further information please visit [email or web address] Please follow instruction sheet, as given.    Follow-Up: At [facility], you and your health needs are our priority.  As part of our continuing mission to provide you with exceptional heart care, we have created designated Provider Care Teams.  These Care Teams include your primary Cardiologist (physician) and Advanced Practice Providers (APPs -  Physician Assistants and Nurse Practitioners) who all work together to provide you with the care you need, when you need it.   Your next appointment:   3 month(s)  The format for your next appointment:   In Person  Provider:   Laurance Flatten, MD   Other Instructions  Mediterranean Diet A Mediterranean diet refers to food and lifestyle choices that are based on the traditions of countries located on the [place] Sea. This way of eating has been shown to help prevent certain conditions and improve outcomes for people who have chronic diseases, like kidney disease and heart  disease. What are tips for following this plan? Lifestyle  Cook and eat meals together with your family, when possible.  Drink enough fluid to keep your urine clear or pale yellow.  Be physically active every day. This includes: ? Aerobic exercise like running or swimming. ? Leisure activities like gardening, walking, or housework.  Get 7-8 hours of sleep each night.  If recommended by your health care provider, drink red wine in moderation. This means 1 glass a day for nonpregnant women and 2 glasses a day for men. A glass of wine equals 5 oz (150 mL). Reading food labels  Check the serving size of packaged foods. For foods such as rice and pasta, the serving size refers to the amount of cooked product, not dry.  Check the total fat in packaged foods. Avoid foods that have saturated fat or trans fats.  Check the ingredients list for added sugars, such as  corn syrup.   Shopping  At the grocery store, buy most of your food from the areas near the walls of the store. This includes: ? Fresh fruits and vegetables (produce). ? Grains, beans, nuts, and seeds. Some of these may be available in unpackaged forms or large amounts (in bulk). ? Fresh seafood. ? Poultry and eggs. ? Low-fat dairy products.  Buy whole ingredients instead of prepackaged foods.  Buy fresh fruits and vegetables in-season from local farmers markets.  Buy frozen fruits and vegetables in resealable bags.  If you do not have access to quality fresh seafood, buy precooked frozen shrimp or canned fish, such as tuna, salmon, or sardines.  Buy small amounts of raw or cooked vegetables, salads, or olives from the deli or salad bar at your store.  Stock your pantry so you always have certain foods on hand, such as olive oil, canned tuna, canned tomatoes, rice, pasta, and beans. Cooking  Cook foods with extra-virgin olive oil instead of using butter or other vegetable oils.  Have meat as a side dish, and have  vegetables or grains as your main dish. This means having meat in small portions or adding small amounts of meat to foods like pasta or stew.  Use beans or vegetables instead of meat in common dishes like chili or lasagna.  Experiment with different cooking methods. Try roasting or broiling vegetables instead of steaming or sauteing them.  Add frozen vegetables to soups, stews, pasta, or rice.  Add nuts or seeds for added healthy fat at each meal. You can add these to yogurt, salads, or vegetable dishes.  Marinate fish or vegetables using olive oil, lemon juice, garlic, and fresh herbs. Meal planning  Plan to eat 1 vegetarian meal one day each week. Try to work up to 2 vegetarian meals, if possible.  Eat seafood 2 or more times a week.  Have healthy snacks readily available, such as: ? Vegetable sticks with hummus. ? [place] yogurt. ? Fruit and nut trail mix.  Eat balanced meals throughout the week. This includes: ? Fruit: 2-3 servings a day ? Vegetables: 4-5 servings a day ? Low-fat dairy: 2 servings a day ? Fish, poultry, or lean meat: 1 serving a day ? Beans and legumes: 2 or more servings a week ? Nuts and seeds: 1-2 servings a day ? Whole grains: 6-8 servings a day ? Extra-virgin olive oil: 3-4 servings a day  Limit red meat and sweets to only a few servings a month   What are my food choices?  Mediterranean diet ? Recommended  Grains: Whole-grain pasta. Brown rice. Bulgar wheat. Polenta. Couscous. Whole-wheat bread. Orpah Cobb.  Vegetables: Artichokes. Beets. Broccoli. Cabbage. Carrots. Eggplant. Green beans. Chard. Kale. Spinach. Onions. Leeks. Peas. Squash. Tomatoes. Peppers. Radishes.  Fruits: Apples. Apricots. Avocado. Berries. Bananas. Cherries. Dates. Figs. Grapes. Lemons. Melon. Oranges. Peaches. Plums. Pomegranate.  Meats and other protein foods: Beans. Almonds. Sunflower seeds. Pine nuts. Peanuts. Cod. Salmon. Scallops. Shrimp. Tuna. Tilapia. Clams.  Oysters. Eggs.  Dairy: Low-fat milk. Cheese. Greek yogurt.  Beverages: Water. Red wine. Herbal tea.  Fats and oils: Extra virgin olive oil. Avocado oil. Grape seed oil.  Sweets and desserts: [place] yogurt with honey. Baked apples. Poached pears. Trail mix.  Seasoning and other foods: Basil. Cilantro. Coriander. Cumin. Mint. Parsley. Sage. Rosemary. Tarragon. Garlic. Oregano. Thyme. Pepper. Balsalmic vinegar. Tahini. Hummus. Tomato sauce. Olives. Mushrooms. ? Limit these  Grains: Prepackaged pasta or rice dishes. Prepackaged cereal with added sugar.  Vegetables: Deep fried  potatoes (french fries).  Fruits: Fruit canned in syrup.  Meats and other protein foods: Beef. Pork. Lamb. Poultry with skin. Hot dogs. Tomasa Blase.  Dairy: Ice cream. Sour cream. Whole milk.  Beverages: Juice. Sugar-sweetened soft drinks. Beer. Liquor and spirits.  Fats and oils: Butter. Canola oil. Vegetable oil. Beef fat (tallow). Lard.  Sweets and desserts: Cookies. Cakes. Pies. Candy.  Seasoning and other foods: Mayonnaise. Premade sauces and marinades. The items listed may not be a complete list. Talk with your dietitian about what dietary choices are right for you. Summary  The Mediterranean diet includes both food and lifestyle choices.  Eat a variety of fresh fruits and vegetables, beans, nuts, seeds, and whole grains.  Limit the amount of red meat and sweets that you eat.  Talk with your health care provider about whether it is safe for you to drink red wine in moderation. This means 1 glass a day for nonpregnant women and 2 glasses a day for men. A glass of wine equals 5 oz (150 mL). This information is not intended to replace advice given to you by your health care provider. Make sure you discuss any questions you have with your health care provider. Document Revised: 11/06/2015 Document Reviewed: 10/30/2015 Elsevier Patient Education  2020 [employer].      Signed, Meriam Sprague, MD   07/07/2020 8:54 AM    [facility]

## 2020-07-07 ENCOUNTER — Encounter: Payer: Self-pay | Admitting: Nurse Practitioner

## 2020-07-07 ENCOUNTER — Other Ambulatory Visit: Payer: Self-pay

## 2020-07-07 ENCOUNTER — Telehealth: Payer: Self-pay | Admitting: Pharmacist

## 2020-07-07 ENCOUNTER — Ambulatory Visit: Payer: BLUE CROSS/BLUE SHIELD | Admitting: Cardiology

## 2020-07-07 ENCOUNTER — Encounter: Payer: Self-pay | Admitting: Cardiology

## 2020-07-07 VITALS — BP 144/88 | HR 59 | Ht 71.0 in | Wt 269.8 lb

## 2020-07-07 DIAGNOSIS — Z79899 Other long term (current) drug therapy: Secondary | ICD-10-CM

## 2020-07-07 DIAGNOSIS — I2510 Atherosclerotic heart disease of native coronary artery without angina pectoris: Secondary | ICD-10-CM | POA: Diagnosis not present

## 2020-07-07 DIAGNOSIS — Z951 Presence of aortocoronary bypass graft: Secondary | ICD-10-CM

## 2020-07-07 DIAGNOSIS — I10 Essential (primary) hypertension: Secondary | ICD-10-CM

## 2020-07-07 DIAGNOSIS — Z720 Tobacco use: Secondary | ICD-10-CM | POA: Diagnosis not present

## 2020-07-07 DIAGNOSIS — E785 Hyperlipidemia, unspecified: Secondary | ICD-10-CM

## 2020-07-07 DIAGNOSIS — I2583 Coronary atherosclerosis due to lipid rich plaque: Secondary | ICD-10-CM

## 2020-07-07 DIAGNOSIS — R079 Chest pain, unspecified: Secondary | ICD-10-CM

## 2020-07-07 LAB — LIPID PANEL
Chol/HDL Ratio: 4.1 ratio (ref 0.0–5.0)
Cholesterol, Total: 148 mg/dL (ref 100–199)
HDL: 36 mg/dL — ABNORMAL LOW (ref >39)
LDL Chol Calc (NIH): 80 mg/dL (ref 0–99)
Triglycerides: 187 mg/dL — ABNORMAL HIGH (ref 0–149)
VLDL Cholesterol Cal: 32 mg/dL (ref 5–40)

## 2020-07-07 MED ORDER — POTASSIUM CHLORIDE CRYS ER 10 MEQ PO TBCR: 10.0000 meq | EXTENDED_RELEASE_TABLET | Freq: Every day | ORAL | 3 refills | Status: DC

## 2020-07-07 MED ORDER — ISOSORBIDE MONONITRATE ER 60 MG PO TB24: 90.0000 mg | ORAL_TABLET | Freq: Every day | ORAL | 3 refills | Status: DC

## 2020-07-07 MED ORDER — LISINOPRIL 40 MG PO TABS: 40.0000 mg | ORAL_TABLET | Freq: Every day | ORAL | 3 refills | Status: DC

## 2020-07-07 MED ORDER — NITROGLYCERIN 0.4 MG SL SUBL: 0.4000 mg | SUBLINGUAL_TABLET | SUBLINGUAL | 2 refills | Status: AC | PRN

## 2020-07-07 MED ORDER — ATORVASTATIN CALCIUM 40 MG PO TABS: 1.0000 | ORAL_TABLET | Freq: Every day | ORAL | 3 refills | Status: DC

## 2020-07-07 MED ORDER — HYDROCHLOROTHIAZIDE 25 MG PO TABS: 25.0000 mg | ORAL_TABLET | Freq: Every day | ORAL | 3 refills | Status: DC

## 2020-07-07 MED ORDER — CLOPIDOGREL BISULFATE 75 MG PO TABS: 75.0000 mg | ORAL_TABLET | Freq: Every day | ORAL | 3 refills | Status: DC

## 2020-07-07 MED ORDER — NEBIVOLOL HCL 10 MG PO TABS: 10.0000 mg | ORAL_TABLET | Freq: Every day | ORAL | 3 refills | Status: DC

## 2020-07-07 MED ORDER — EZETIMIBE 10 MG PO TABS: 10.0000 mg | ORAL_TABLET | Freq: Every day | ORAL | 3 refills | Status: DC

## 2020-07-07 MED ORDER — AMLODIPINE BESYLATE 10 MG PO TABS: 10.0000 mg | ORAL_TABLET | Freq: Every day | ORAL | 3 refills | Status: DC

## 2020-07-07 NOTE — Patient Instructions (Signed)
Medication Instructions:  Your physician has recommended you make the following change in your medication:  INCREASE Isosorbide mononitrate (Imdur) to 90 mg daily  *If you need a refill on your cardiac medications before your next appointment, please call your pharmacy*   Lab Work: TODAY - Cholesterol (lipid profile) If you have labs (blood work) drawn today and your tests are completely normal, you will receive your results only by: Marland Kitchen MyChart Message (if you have MyChart) OR . A paper copy in the mail If you have any lab test that is abnormal or we need to change your treatment, we will call you to review the results.   Testing/Procedures: Your physician has requested that you have a lexiscan myoview. For further information please visit [email or web address] Please follow instruction sheet, as given.    Follow-Up: At [facility], you and your health needs are our priority.  As part of our continuing mission to provide you with exceptional heart care, we have created designated Provider Care Teams.  These Care Teams include your primary Cardiologist (physician) and Advanced Practice Providers (APPs -  Physician Assistants and Nurse Practitioners) who all work together to provide you with the care you need, when you need it.   Your next appointment:   3 month(s)  The format for your next appointment:   In Person  Provider:   Laurance Flatten, MD   Other Instructions  Mediterranean Diet A Mediterranean diet refers to food and lifestyle choices that are based on the traditions of countries located on the [place]. This way of eating has been shown to help prevent certain conditions and improve outcomes for people who have chronic diseases, like kidney disease and heart disease. What are tips for following this plan? Lifestyle  Cook and eat meals together with your family, when possible.  Drink enough fluid to keep your urine clear or pale yellow.  Be physically  active every day. This includes: ? Aerobic exercise like running or swimming. ? Leisure activities like gardening, walking, or housework.  Get 7-8 hours of sleep each night.  If recommended by your health care provider, drink red wine in moderation. This means 1 glass a day for nonpregnant women and 2 glasses a day for men. A glass of wine equals 5 oz (150 mL). Reading food labels  Check the serving size of packaged foods. For foods such as rice and pasta, the serving size refers to the amount of cooked product, not dry.  Check the total fat in packaged foods. Avoid foods that have saturated fat or trans fats.  Check the ingredients list for added sugars, such as corn syrup.   Shopping  At the grocery store, buy most of your food from the areas near the walls of the store. This includes: ? Fresh fruits and vegetables (produce). ? Grains, beans, nuts, and seeds. Some of these may be available in unpackaged forms or large amounts (in bulk). ? Fresh seafood. ? Poultry and eggs. ? Low-fat dairy products.  Buy whole ingredients instead of prepackaged foods.  Buy fresh fruits and vegetables in-season from local farmers markets.  Buy frozen fruits and vegetables in resealable bags.  If you do not have access to quality fresh seafood, buy precooked frozen shrimp or canned fish, such as tuna, salmon, or sardines.  Buy small amounts of raw or cooked vegetables, salads, or olives from the deli or salad bar at your store.  Stock your pantry so you always have certain foods on hand, such as  olive oil, canned tuna, canned tomatoes, rice, pasta, and beans. Cooking  Cook foods with extra-virgin olive oil instead of using butter or other vegetable oils.  Have meat as a side dish, and have vegetables or grains as your main dish. This means having meat in small portions or adding small amounts of meat to foods like pasta or stew.  Use beans or vegetables instead of meat in common dishes like  chili or lasagna.  Experiment with different cooking methods. Try roasting or broiling vegetables instead of steaming or sauteing them.  Add frozen vegetables to soups, stews, pasta, or rice.  Add nuts or seeds for added healthy fat at each meal. You can add these to yogurt, salads, or vegetable dishes.  Marinate fish or vegetables using olive oil, lemon juice, garlic, and fresh herbs. Meal planning  Plan to eat 1 vegetarian meal one day each week. Try to work up to 2 vegetarian meals, if possible.  Eat seafood 2 or more times a week.  Have healthy snacks readily available, such as: ? Vegetable sticks with hummus. ? [place] yogurt. ? Fruit and nut trail mix.  Eat balanced meals throughout the week. This includes: ? Fruit: 2-3 servings a day ? Vegetables: 4-5 servings a day ? Low-fat dairy: 2 servings a day ? Fish, poultry, or lean meat: 1 serving a day ? Beans and legumes: 2 or more servings a week ? Nuts and seeds: 1-2 servings a day ? Whole grains: 6-8 servings a day ? Extra-virgin olive oil: 3-4 servings a day  Limit red meat and sweets to only a few servings a month   What are my food choices?  Mediterranean diet ? Recommended  Grains: Whole-grain pasta. Brown rice. Bulgar wheat. Polenta. Couscous. Whole-wheat bread. Orpah Cobb.  Vegetables: Artichokes. Beets. Broccoli. Cabbage. Carrots. Eggplant. Green beans. Chard. Kale. Spinach. Onions. Leeks. Peas. Squash. Tomatoes. Peppers. Radishes.  Fruits: Apples. Apricots. Avocado. Berries. Bananas. Cherries. Dates. Figs. Grapes. Lemons. Melon. Oranges. Peaches. Plums. Pomegranate.  Meats and other protein foods: Beans. Almonds. Sunflower seeds. Pine nuts. Peanuts. Cod. Salmon. Scallops. Shrimp. Tuna. Tilapia. Clams. Oysters. Eggs.  Dairy: Low-fat milk. Cheese. Greek yogurt.  Beverages: Water. Red wine. Herbal tea.  Fats and oils: Extra virgin olive oil. Avocado oil. Grape seed oil.  Sweets and desserts: [place] yogurt  with honey. Baked apples. Poached pears. Trail mix.  Seasoning and other foods: Basil. Cilantro. Coriander. Cumin. Mint. Parsley. Sage. Rosemary. Tarragon. Garlic. Oregano. Thyme. Pepper. Balsalmic vinegar. Tahini. Hummus. Tomato sauce. Olives. Mushrooms. ? Limit these  Grains: Prepackaged pasta or rice dishes. Prepackaged cereal with added sugar.  Vegetables: Deep fried potatoes (french fries).  Fruits: Fruit canned in syrup.  Meats and other protein foods: Beef. Pork. Lamb. Poultry with skin. Hot dogs. Tomasa Blase.  Dairy: Ice cream. Sour cream. Whole milk.  Beverages: Juice. Sugar-sweetened soft drinks. Beer. Liquor and spirits.  Fats and oils: Butter. Canola oil. Vegetable oil. Beef fat (tallow). Lard.  Sweets and desserts: Cookies. Cakes. Pies. Candy.  Seasoning and other foods: Mayonnaise. Premade sauces and marinades. The items listed may not be a complete list. Talk with your dietitian about what dietary choices are right for you. Summary  The Mediterranean diet includes both food and lifestyle choices.  Eat a variety of fresh fruits and vegetables, beans, nuts, seeds, and whole grains.  Limit the amount of red meat and sweets that you eat.  Talk with your health care provider about whether it is safe for you to drink red wine  in moderation. This means 1 glass a day for nonpregnant women and 2 glasses a day for men. A glass of wine equals 5 oz (150 mL). This information is not intended to replace advice given to you by your health care provider. Make sure you discuss any questions you have with your health care provider. Document Revised: 11/06/2015 Document Reviewed: 10/30/2015 Elsevier Patient Education  [place].

## 2020-07-07 NOTE — Telephone Encounter (Signed)
[facility] prior authorization approved through 07/07/21. Called pt to discuss details of therapy. He prefers to focus on lifestyle improvements first but is agreeable to reconsider Wegovy therapy if diet/exercise does not work. I'll call pt in 1 month to assess his progress and discuss Wegovy again at that time. Pt was appreciative for phone call.

## 2020-07-07 NOTE — Telephone Encounter (Signed)
Consulted by Dr Shari Prows to initiate ITG5QD therapy for weight loss. Will submit prior authorization for Wegovy to see if his plan covers therapy.

## 2020-07-07 NOTE — Telephone Encounter (Signed)
Thank you so much

## 2020-07-08 ENCOUNTER — Telehealth: Payer: Self-pay | Admitting: *Deleted

## 2020-07-08 MED ORDER — ICOSAPENT ETHYL 1 G PO CAPS: 2.0000 g | ORAL_CAPSULE | Freq: Two times a day (BID) | ORAL | 1 refills | Status: DC

## 2020-07-08 NOTE — Telephone Encounter (Signed)
-----   Message from Meriam Sprague, MD sent at 07/08/2020  1:21 PM EDT ----- Can we start him on vascepa for his triglycerides? We may need to increase his lipitor as well in the future as his LDL is a little above goal. He needs to continue to work on diet and exercise as this will help lower both his cholesterol and his triglycerides significantly.

## 2020-07-08 NOTE — Telephone Encounter (Addendum)
Pt made aware of lab results and recommendations per Dr. Shari Prows, for him to start Vascepa 2 grams po bid, and stop taking his regular OTC fish oil, for this will replace that.  Also endorsed to the pt that I will mail him a 90 day copay card for this medication to his mailing address on file.  Advised him to continue to work on diet and exercise, as this will help lower his cholesterol and triglycerides significantly. Confirmed the pharmacy of choice with the pt.  Pt verbalized understanding and agrees with this plan.

## 2020-07-09 ENCOUNTER — Telehealth (HOSPITAL_COMMUNITY): Payer: Self-pay | Admitting: *Deleted

## 2020-07-09 ENCOUNTER — Encounter (HOSPITAL_COMMUNITY): Payer: Self-pay | Admitting: *Deleted

## 2020-07-09 NOTE — Telephone Encounter (Signed)
Patient given detailed instructions per Myocardial Perfusion Study Information Sheet for the test on 07/16/20 at 0730. Patient notified to arrive 15 minutes early and that it is imperative to arrive on time for appointment to keep from having the test rescheduled.  If you need to cancel or reschedule your appointment, please call the office within 24 hours of your appointment. . Patient verbalized understanding.Sayda Grable, Adelene Idler Mychart letter sent with instructions.

## 2020-07-10 ENCOUNTER — Telehealth: Payer: Self-pay

## 2020-07-10 NOTE — Telephone Encounter (Signed)
**Note De-Identified Ryna Beckstrom Obfuscation** Following message received from Coverm[employer]eds: Arlys John Schnitker Key: [id number] - PA Case ID: [id number] - Rx #: [id number] Outcome: Approved today ICOSAPENT CAP 1GM is approved through 07/10/2021.  Your patient may now fill this prescription and it will be covered. Drug: Icosapent Ethyl 1GM capsules Form: [employer] Electronic Prior Authorization Form (2017 NCPDP)  I have notified [facility] of this approval.

## 2020-07-10 NOTE — Telephone Encounter (Signed)
**Note De-Identified [name] Obfuscation** Icosapent PA started through covermymeds. Key: [id number]

## 2020-07-16 ENCOUNTER — Ambulatory Visit (HOSPITAL_COMMUNITY): Payer: BLUE CROSS/BLUE SHIELD | Attending: Cardiovascular Disease

## 2020-07-16 ENCOUNTER — Ambulatory Visit (INDEPENDENT_AMBULATORY_CARE_PROVIDER_SITE_OTHER)
Admission: RE | Admit: 2020-07-16 | Discharge: 2020-07-16 | Disposition: A | Discharge status: Home / Self Care | Payer: BLUE CROSS/BLUE SHIELD | Source: Ambulatory Visit | Attending: Cardiology | Admitting: Cardiology

## 2020-07-16 ENCOUNTER — Other Ambulatory Visit: Payer: Self-pay

## 2020-07-16 DIAGNOSIS — Z951 Presence of aortocoronary bypass graft: Secondary | ICD-10-CM | POA: Diagnosis not present

## 2020-07-16 DIAGNOSIS — I2583 Coronary atherosclerosis due to lipid rich plaque: Secondary | ICD-10-CM | POA: Insufficient documentation

## 2020-07-16 DIAGNOSIS — I10 Essential (primary) hypertension: Secondary | ICD-10-CM | POA: Insufficient documentation

## 2020-07-16 DIAGNOSIS — F17210 Nicotine dependence, cigarettes, uncomplicated: Secondary | ICD-10-CM | POA: Diagnosis not present

## 2020-07-16 DIAGNOSIS — I2510 Atherosclerotic heart disease of native coronary artery without angina pectoris: Secondary | ICD-10-CM | POA: Insufficient documentation

## 2020-07-16 DIAGNOSIS — Z720 Tobacco use: Secondary | ICD-10-CM

## 2020-07-16 DIAGNOSIS — Z79899 Other long term (current) drug therapy: Secondary | ICD-10-CM | POA: Diagnosis not present

## 2020-07-16 DIAGNOSIS — Z87891 Personal history of nicotine dependence: Secondary | ICD-10-CM | POA: Diagnosis not present

## 2020-07-16 DIAGNOSIS — E785 Hyperlipidemia, unspecified: Secondary | ICD-10-CM | POA: Insufficient documentation

## 2020-07-16 LAB — MYOCARDIAL PERFUSION IMAGING
LV dias vol: 158 mL (ref 62–150)
LV sys vol: 83 mL
Peak HR: 75 {beats}/min
Rest HR: 49 {beats}/min
SDS: 3
SRS: 0
SSS: 3
TID: 1.14

## 2020-07-16 MED ORDER — TECHNETIUM TC 99M TETROFOSMIN IV KIT
10.5000 | PACK | Freq: Once | INTRAVENOUS | Status: AC | PRN
  Administered 2020-07-16: 10.5 via INTRAVENOUS
  Filled 2020-07-16: qty 11

## 2020-07-16 MED ORDER — REGADENOSON 0.4 MG/5ML IV SOLN
0.4000 mg | Freq: Once | INTRAVENOUS | Status: AC
  Administered 2020-07-16: 0.4 mg via INTRAVENOUS

## 2020-07-16 MED ORDER — TECHNETIUM TC 99M TETROFOSMIN IV KIT
31.1000 | PACK | Freq: Once | INTRAVENOUS | Status: AC | PRN
  Administered 2020-07-16: 31.1 via INTRAVENOUS
  Filled 2020-07-16: qty 32

## 2020-07-18 ENCOUNTER — Telehealth: Payer: Self-pay

## 2020-07-18 DIAGNOSIS — Z720 Tobacco use: Secondary | ICD-10-CM

## 2020-07-18 NOTE — Telephone Encounter (Signed)
-----   Message from Meriam Sprague, MD sent at 07/18/2020  3:40 AM EDT ----- His CT of his chest shows he has underlying COPD and disease in his heart arteries (known; he has had bypass surgery), but no concerning lung nodules/masses. We will continue to monitor his lungs annually with CT scans. He needs to continue to try and quit smoking.

## 2020-07-18 NOTE — Telephone Encounter (Signed)
The patient has been notified of the result and verbalized understanding.  All questions (if any) were answered. Theresia Majors, RN 07/18/2020 12:13 PM

## 2020-07-21 ENCOUNTER — Telehealth: Payer: Self-pay | Admitting: *Deleted

## 2020-07-21 DIAGNOSIS — Z720 Tobacco use: Secondary | ICD-10-CM

## 2020-07-21 NOTE — Telephone Encounter (Signed)
New order for CT Chest for Lung Screening placed under Dr. Devin Going name.  CT scheduler aware.

## 2020-07-21 NOTE — Telephone Encounter (Signed)
-----   Message from Gaspar Bidding sent at 07/21/2020  8:03 AM EDT ----- Regarding: RE: repeat CT in one year per Shara Blazing  I went to schedule him but the order is placed under Armanda Magic. Should that be changed first?  Thanks Misty Stanley ----- Message ----- From: Loa Socks, LPN Sent: 07/25/9792   7:28 AM EDT To: Gaspar Bidding Subject: repeat CT in one year per Shari Prows            This pt needs a repeat CT for cancer screening done in one year per [place]. Order is in, and can you please schedule and shoot me the date?  Thanks a ton, [employer]

## 2020-07-25 DIAGNOSIS — M222X1 Patellofemoral disorders, right knee: Secondary | ICD-10-CM | POA: Diagnosis not present

## 2020-07-25 DIAGNOSIS — M170 Bilateral primary osteoarthritis of knee: Secondary | ICD-10-CM | POA: Diagnosis not present

## 2020-07-25 DIAGNOSIS — M222X2 Patellofemoral disorders, left knee: Secondary | ICD-10-CM | POA: Diagnosis not present

## 2020-07-25 DIAGNOSIS — M25552 Pain in left hip: Secondary | ICD-10-CM | POA: Diagnosis not present

## 2020-08-05 ENCOUNTER — Telehealth: Payer: Self-pay | Admitting: Pharmacist

## 2020-08-05 MED ORDER — SEMAGLUTIDE-WEIGHT MANAGEMENT 0.5 MG/0.5ML ~~LOC~~ SOAJ: 0.5000 mg | SUBCUTANEOUS | 0 refills | Status: DC

## 2020-08-05 MED ORDER — SEMAGLUTIDE-WEIGHT MANAGEMENT 1.7 MG/0.75ML ~~LOC~~ SOAJ: 1.7000 mg | SUBCUTANEOUS | 0 refills | Status: AC

## 2020-08-05 MED ORDER — SEMAGLUTIDE-WEIGHT MANAGEMENT 1 MG/0.5ML ~~LOC~~ SOAJ: 1.0000 mg | SUBCUTANEOUS | 0 refills | Status: DC

## 2020-08-05 MED ORDER — SEMAGLUTIDE-WEIGHT MANAGEMENT 0.25 MG/0.5ML ~~LOC~~ SOAJ: 0.2500 mg | SUBCUTANEOUS | 0 refills | Status: DC

## 2020-08-05 MED ORDER — SEMAGLUTIDE-WEIGHT MANAGEMENT 2.4 MG/0.75ML ~~LOC~~ SOAJ: 2.4000 mg | SUBCUTANEOUS | 6 refills | Status: DC

## 2020-08-05 NOTE — Telephone Encounter (Signed)
Called pt to follow up with weight loss. Last spoke to pt about 1 month ago after his visit with Dr Shari Prows. He wanted to focus on lifestyle changes rather than start medication.  He reports he has lost 8-9 lbs in the past month by watching his portions. Congratulated pt on his success. He is now interested in trying [facility] as well. Rx sent to pharmacy and pt scheduled with PharmD 6/2 for first injection training and weight loss education.

## 2020-08-06 DIAGNOSIS — M25552 Pain in left hip: Secondary | ICD-10-CM | POA: Diagnosis not present

## 2020-08-06 DIAGNOSIS — M25551 Pain in right hip: Secondary | ICD-10-CM | POA: Diagnosis not present

## 2020-08-21 ENCOUNTER — Telehealth: Payer: Self-pay | Admitting: Pharmacist

## 2020-08-21 ENCOUNTER — Other Ambulatory Visit: Payer: Self-pay

## 2020-08-21 ENCOUNTER — Ambulatory Visit (INDEPENDENT_AMBULATORY_CARE_PROVIDER_SITE_OTHER): Payer: BLUE CROSS/BLUE SHIELD | Admitting: Pharmacist

## 2020-08-21 VITALS — Wt 266.0 lb

## 2020-08-21 DIAGNOSIS — E669 Obesity, unspecified: Secondary | ICD-10-CM | POA: Diagnosis not present

## 2020-08-21 DIAGNOSIS — Z6837 Body mass index (BMI) 37.0-37.9, adult: Secondary | ICD-10-CM

## 2020-08-21 DIAGNOSIS — Z720 Tobacco use: Secondary | ICD-10-CM | POA: Diagnosis not present

## 2020-08-21 MED ORDER — OZEMPIC (0.25 OR 0.5 MG/DOSE) 2 MG/1.5ML ~~LOC~~ SOPN: 0.2500 mg | PEN_INJECTOR | SUBCUTANEOUS | 1 refills | Status: DC

## 2020-08-21 NOTE — Progress Notes (Signed)
Patient ID: [name]                 DOB: July 17, 1965                    MRN: [id number]     HPI: [name] is a 55 y.o. male patient referred to pharmacy clinic by Dr. Johney Frame to initiate weight loss therapy with GLP1-RA. 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 Servando Snare (LIMA to LAD, Left radial artery to OM, SVG to Intermediate, and SVG to PD), post operative atrial fibrillation managed with amiodarone, HTN, depression and anxiety. Most recent BMI 37 kg/m2.   Patient has been trying to cut back on portions to lose weight. He has lost about 8-9lb himself. He is avoiding fried foods and red meat. I will get a baseline A1C on patient today as he hasnt had one since 2017. He is still smoking, but has cut back to 1/2 pack per day. He is wearing a nicotine 44m patch. He works long hours and gets up very early.   Current weight management medications: none  Previously tried meds: none  Current meds that may affect weight: none  Baseline weight/BMI: 266lb/ 37.1 kg/m2  Insurance payor: [employer] [place]  Diet:  -Breakfast:skips breakfast -Lunch: tKuwaitsandwich on rye bread, banana mid morning -Dinner: processed meal -Snacks: apple brown sugar bar, mandarin oranges -Drinks: 2 cups coffee, Gatorade   Exercise: not much   Family History: The patient's family history includes Cancer in his father; Diabetes in his sister; Heart attack (age of onset: 5102 in his father.  Social History: + tobacco 1/2 pack per day  Labs: Lab Results  Component Value Date   HGBA1C 5.1 05/20/2015    Wt Readings from Last 1 Encounters:  07/16/20 269 lb (122 kg)    BP Readings from Last 1 Encounters:  07/07/20 (!) 144/88   Pulse Readings from Last 1 Encounters:  07/07/20 (!) 59       Component Value Date/Time   CHOL 148 07/07/2020 0854   TRIG 187 (H) 07/07/2020 0854   HDL 36 (L) 07/07/2020 0854   CHOLHDL 4.1  07/07/2020 0854   CHOLHDL 3.8 05/20/2015 0514   VLDL 22 05/20/2015 0514   LDLCALC 80 07/07/2020 0854    Past Medical History:  Diagnosis Date  . Anxiety   . Depression   . Hyperlipidemia   . Hypertension   . Lumbar herniated disc dx'd 03/2015  . [place]ST [place]    Current Outpatient Medications on File Prior to Visit  Medication Sig Dispense Refill  . alfuzosin (UROXATRAL) 10 MG 24 hr tablet TAKE 1 TABLET BY MOUTH EVERYDAY AT BEDTIME 90 tablet 3  . amLODipine (NORVASC) 10 MG tablet Take 1 tablet (10 mg total) by mouth daily. 90 tablet 3  . aspirin EC 81 MG EC tablet Take 1 tablet (81 mg total) by mouth daily.    .Marland Kitchenatorvastatin (LIPITOR) 40 MG tablet Take 1 tablet (40 mg total) by mouth daily. 90 tablet 3  . clopidogrel (PLAVIX) 75 MG tablet Take 1 tablet (75 mg total) by mouth daily. 90 tablet 3  . ezetimibe (ZETIA) 10 MG tablet Take 1 tablet (10 mg total) by mouth daily. 90 tablet 3  . finasteride (PROSCAR) 5 MG tablet TAKE 1 TABLET BY MOUTH EVERY DAY 90 tablet 3  . FLUoxetine (PROZAC) 20 MG tablet Take 30 mg  by mouth daily.  1  . hydrochlorothiazide (HYDRODIURIL) 25 MG tablet Take 1 tablet (25 mg total) by mouth daily. 90 tablet 3  . icosapent Ethyl (VASCEPA) 1 g capsule Take 2 capsules (2 g total) by mouth 2 (two) times daily. 360 capsule 1  . isosorbide mononitrate (IMDUR) 60 MG 24 hr tablet Take 1.5 tablets (90 mg total) by mouth daily. 135 tablet 3  . levocetirizine (XYZAL) 5 MG tablet Take 5 mg by mouth every evening.    Marland Kitchen lisinopril (ZESTRIL) 40 MG tablet Take 1 tablet (40 mg total) by mouth daily. 90 tablet 3  . MYRBETRIQ 25 MG TB24 tablet TAKE 1 TABLET BY MOUTH EVERY DAY 30 tablet 3  . nebivolol (BYSTOLIC) 10 MG tablet Take 1 tablet (10 mg total) by mouth daily. 90 tablet 3  . nitroGLYCERIN (NITROSTAT) 0.4 MG SL tablet Place 1 tablet (0.4 mg total) under the tongue every 5 (five) minutes x 3 doses as needed for chest pain. 25  tablet 2  . pantoprazole (PROTONIX) 40 MG tablet Take 1 tablet (40 mg total) by mouth daily. 90 tablet 2  . potassium chloride (KLOR-CON M10) 10 MEQ tablet Take 1 tablet (10 mEq total) by mouth daily. Please keep upcoming appointment in April 2022 for future refills. Thank you 90 tablet 3  . Semaglutide-Weight Management 0.25 MG/0.5ML SOAJ Inject 0.25 mg into the skin once a week for 28 days. 2 mL 0  . [START ON 09/03/2020] Semaglutide-Weight Management 0.5 MG/0.5ML SOAJ Inject 0.5 mg into the skin once a week for 28 days. 2 mL 0  . [START ON 10/02/2020] Semaglutide-Weight Management 1 MG/0.5ML SOAJ Inject 1 mg into the skin once a week for 28 days. 2 mL 0  . [START ON 10/31/2020] Semaglutide-Weight Management 1.7 MG/0.75ML SOAJ Inject 1.7 mg into the skin once a week for 28 days. 3 mL 0  . [START ON 11/29/2020] Semaglutide-Weight Management 2.4 MG/0.75ML SOAJ Inject 2.4 mg into the skin once a week. 3 mL 6   No current facility-administered medications on file prior to visit.    Allergies  Allergen Reactions  . Atorvastatin Other (See Comments)    80 mg dose legs cramps, tolerates 40 mg dose  . Claritin [Loratadine] Other (See Comments)    Makes allergies worse-"a lot worse"  . Codeine Nausea And Vomiting  . Vicodin [Hydrocodone-Acetaminophen] Nausea And Vomiting    "pretty much any codeine"     Assessment/Plan:  1. Weight loss - Patient has not met goal of at least 5% of body weight loss with comprehensive lifestyle modifications alone in the past 3-6 months. Pharmacotherapy is appropriate to pursue as augmentation. Will start [facility]. Due to the fact that the manufacture temporarily stopped making the 0.25 and 0.64m doses of Wegovy due to supply issues, I will give the patient a free trial card of Ozempic to start his titration. This will give him 4 week so 0.263mdose and 2 weeks of 0.26m63mose. At that point, hopefully Wegovy 0.26mg31mll be available. Confirmed patient not pregnant and no  personal or family history of medullary thyroid carcinoma (MTC) or Multiple Endocrine Neoplasia syndrome type 2 (MEN 2). He denies a hx of pancreatitis or gallstones.  Advised patient on common side effects including nausea, diarrhea, dyspepsia, decreased appetite, and fatigue. Counseled patient on reducing meal size and how to titrate medication to minimize side effects. Counseled patient to call if intolerable side effects or if experiencing dehydration, abdominal pain, or dizziness. Patient will adhere  to dietary modifications and will target at least 150 minutes of moderate intensity exercise weekly.   We discussed the amount of sugar in Gatorade. Recommended changing to water. Suggested he either try to meal prep on the weekend or use a meal prep company for his dinner instead of eating [employer] frozen meals. This would provide him better fresh healthy options but still not require he cook when he gets home from work. Advised he watch for added sugars in foods. Try to increase his vegetable intake.  Talked about quitting smoking. Patient wants to quite. He states he feels better since he cut back. He quite for 5 months after his surgery but his wife smokes and he was peer pressured back into it. He has tried wellbutrin and chantix in the past and didn't like how it made him feel. We talked about the dangers (nausea) of smoking it patch on. We also discussed replacing a unhealthy habit with a good one. Suggesting he try having his coffee and chewing on straw or toothpick instead of smoking.   He works long hours and get up very early. He is too tired to exercise when he gets home. We talked about trying to go for a long walk on the weekends to make up for missed exercise during the week.  Patient was taught how to use the Ozempic pen and the [facility] pen.  Follow up in 1 month by phone.  Ramond Dial, Pharm.D, BCPS, CPP [place]  [address] [place], [place]  [address]  Phone: [phone]; Fax: [phone]

## 2020-08-21 NOTE — Patient Instructions (Addendum)
Call me at [phone] with any questions Give free trial card to pharmacist  GLP-1 Receptor Agonist Counseling Points 1. This medication reduces your appetite and may make you feel fuller longer.  2. Stop eating when your body tells you that you are full. This will likely happen sooner than you are used to. 3. Store your medication in the fridge until you are ready to use it. 4. Inject your medication in the fatty tissue of your lower abdominal area (2 inches away from belly button) or upper outer thigh. 5. Common side effects include: nausea, diarrhea/constipation, and heartburn, and are more likely to occur if you overeat.   Tips for living a healthier life  SUGAR  Sugar is a huge problem in the modern day diet. Sugar is a big contributor to heart disease, diabetes, high triglyceride levels, fatty liver disease and obesity. Sugar is hidden in almost all packaged foods/beverages. Added sugar is extra sugar that is added beyond what is naturally found and has no nutritional benefit for your body. The American Heart Association recommends limiting added sugars to no more than 25g for women and 36 grams for men per day. There are many names for sugar including maltose, sucrose (names ending in "ose"), high fructose corn syrup, molasses, cane sugar, corn sweetener, raw sugar, syrup, honey or fruit juice concentrate.   One of the best ways to limit your added sugars is to stop drinking sweetened beverages such as soda, sweet tea, and fruit juice. There is 65g of added sugars in one 20oz bottle of Coke! That is equal to 6 donuts.   Pay attention and read all nutrition facts labels. Below is an examples of a nutrition facts label. The #1 is showing you the total sugars where the # 2 is showing you the added sugars. This one serving has almost the max amount of added sugars per day!     EXERCISE  Exercise is good. We've all heard that. In an ideal world, we would all have time and resources to  get plenty of it. When you are active, your heart pumps more efficiently and you will feel better.  Multiple studies show that even walking regularly has benefits that include living a longer life. The American Heart Association recommends 150 minutes per week of exercise (30 minutes per day most days of the week). You can do this in any increment you wish. Nine or more 10-minute walks count. So does an hour-long exercise class. Break the time apart into what will work in your life. Some of the best things you can do include walking briskly, jogging, cycling or swimming laps. Not everyone is ready to "exercise." Sometimes we need to start with just getting active. Here are some easy ways to be more active throughout the day: Marland Kitchen Take the stairs instead of the elevator . Go for a 10-15 minute walk during your lunch break (find a friend to make it more enjoyable) . When shopping, park at the back of the parking lot . If you take public transportation, get off one stop early and walk the extra distance . Pace around while making phone calls  Check with your doctor if you aren't sure what your limitations may be. Always remember to drink plenty of water when doing any type of exercise. Don't feel like a failure if you're not getting the 90-150 minutes per week. If you started by being a couch potato, then just a 10-minute walk each day is a huge improvement. Start  with little victories and work your way up.   HEALTHY EATING TIPS  When looking to improve your eating habits, whether to lose weight, lower blood pressure or just be healthier, it helps to know what a serving size is.   Grains 1 slice of bread,  bagel,  cup pasta or rice  Vegetables 1 cup fresh or raw vegetables,  cup cooked or canned Fruits 1 piece of medium sized fruit,  cup canned,   Meats/Proteins  cup dried       1 oz meat, 1 egg,  cup cooked beans, nuts or seeds  Dairy        Fats Individual yogurt container, 1 cup (8oz)    1  teaspoon margarine/butter or vegetable  milk or milk alternative, 1 slice of cheese          oil; 1 tablespoon mayonnaise or salad dressing                  Plan ahead: make a menu of the meals for a week then create a grocery list to go with that menu. Consider meals that easily stretch into a night of leftovers, such as stews or casseroles. Or consider making two of your favorite meal and put one in the freezer for another night. Try a night or two each week that is "meatless" or "no cook" such as salads. When you get home from the grocery store wash and prepare your vegetables and fruits. Then when you need them they are ready to go.   Tips for going to the grocery store: . Buy store or generic brands . Check the weekly ad from your store on-line or in their in-store flyer . Look at the unit price on the shelf tag to compare/contrast the costs of different items . Buy fruits/vegetables in season . Carrots, bananas and apples are low-cost, naturally healthy items . If meats or frozen vegetables are on sale, buy some extras and put in your freezer . Limit buying prepared or "ready to eat" items, even if they are pre-made salads or fruit snacks . Do not shop when you're hungry . Foods at eye level tend to be more expensive. Look on the high and low shelves for deals. . Consider shopping at the farmer's market for fresh foods in season. Marland Kitchen Avoid the cookie and chip aisles (these are expensive, high in calories and low in nutritional value). Shop on the outside of the grocery store.  Healthy food preparations: . If you can't get lean hamburger, be sure to drain the fat when cooking . Steam, saut (in olive oil), grill or bake foods . Experiment with different seasonings to avoid adding salt to your foods. Kosher salt, sea salt and Himalayan salt are all still salt and should be avoided. Try seasoning food with onion, garlic, thyme, rosemary, basil ect. Onion powder or garlic powder is ok. Avoid if  it says salt (ie garlic salt).        Other resources: American Heart Association - MartiniMobile.it         Go to the Healthy Living tab to get more information American Diabetes Association - www.diabetes.org         You don't have to be diabetic - check out the Food and Fitness tab

## 2020-08-21 NOTE — Telephone Encounter (Signed)
Patient called and stated that the pharmacist told him that the [facility] will be in tomorrow. I stated that it would be ok to wait and see if it comes in tomorrow and use the [facility] instead.  I will leave the ozempic rx active for now until we know the [facility] comes in.

## 2020-08-22 LAB — HEMOGLOBIN A1C
Est. average glucose Bld gHb Est-mCnc: 126 mg/dL
Hgb A1c MFr Bld: 6 % — ABNORMAL HIGH (ref 4.8–5.6)

## 2020-08-25 ENCOUNTER — Telehealth: Payer: Self-pay | Admitting: Pharmacist

## 2020-08-25 NOTE — Telephone Encounter (Signed)
Patient called back.  Reports pharmacy is not able to get his strength of Wegovy after all.  Patient has free trial card for Ozempic.  Will start Ozempic first and after 6 weeks, patient can pick up a sample or maybe Wegovy will be back in stock.  Patient voiced understanding.

## 2020-09-15 ENCOUNTER — Telehealth: Payer: Self-pay | Admitting: Pharmacist

## 2020-09-15 NOTE — Telephone Encounter (Signed)
Called pt to see how he was going on [facility] His pharmacy was never able to get the [facility] 0.25mg  pen due to supply issues, but he was able to use the free coupon we gave him to the ozempic 0.25/0.5mg  pen. He started on 6/18. I will call him in 2 more weeks to see how he is doing and remind him to increase. Hoepfully we will be able to give him 1 more sample of Ozempic and then the pharmacy will be able to get the 1mg  Wegovy pens.

## 2020-10-01 ENCOUNTER — Telehealth: Payer: Self-pay | Admitting: Pharmacist

## 2020-10-01 NOTE — Telephone Encounter (Signed)
Having issues with supply of Wegovy. Will try to get Saxenda. PA submitted.  I called patient to review the plan of switching from Ozempic to [place]agi¼ez. PA pending- can give him a sample.  Bernie Covey is giving once a day. Can start at either the 1.2mg  dose since he would have started the 0.5mg  of ozempic on Saturday. Titrate every week 1.2mg > 1.8>2.4>3. At that point he can be transitioned to [facility].  Called pt and LVM for him to return call.

## 2020-10-06 NOTE — Telephone Encounter (Signed)
I spoke with patient. He states he is doing well on Ozempic. Increased to 0.5mg  on Saturday. Reviewed the switch to Bells with him. He can finish out the last dose in his Ozempic pen (2nd dose of 0.5mg ) and then he can pick up a sample of Saxenda. Will start at 1.8mg  x 1 week then 2.4mg  x 1 week then 3mg  x 1 week. He then can be transitioned to [facility] 1.7mg  weekly. He prefers to be on a weekly medicine but understand we need to get him on higher doses of GLP-1 before we can switch him to the doses of Wegovy available. Patient will let me know when he can stop by to pick up Saxenda and review instructions.

## 2020-10-07 NOTE — Telephone Encounter (Signed)
Patient will stop by in the AM on 7/27 to pick up his Saxenda. He knows that I will not be in the office but Aundra Millet will be available. I will leave the medication with a typed titration schedule for him. He already knows how to use the pen device bc he was on ozempic.  Starting Sat 7/30 inject Saxenda 1.2mg  daily x 1 week Then increase to 1.8mg  x 1 week Then increase to 2.4mg  x 1 week Then increase to 3.0mg  x 2 weeks Then transition to [facility] 1.7mg  weekly

## 2020-10-11 NOTE — Progress Notes (Signed)
Cardiology Office Note:    Date:  10/13/2020   ID:  [name], DOB 08/14/1965, MRN [id number]  PCP:  Jarrett Soho, PA-C   [facility] Providers Cardiologist:  Tobias Alexander, MD (Inactive) {   Referring MD: Jarrett Soho, PA-C    History of Present Illness:    [name] is a 55 y.o. male with a hx of CAD s/p CABG x 4 04/18/2015 by Dr. Tyrone Sage (LIMA to LAD, Left radial artery to OM, SVG to Intermediate, and SVG to PD), post operative atrial fibrillation managed with amiodarone, HTN, depression and anxiety who was previously followed by Dr. Delton See who now returns to clinic for follow-up.   Patient has history of CAD s/p CABG in 2017 as detailed above.  1 month after his CABG, he was readmitted 04/2015 for recurrent CP and hypertensive urgency. Enzyme elevation was c/w NSTEMI. Subsequently, he underwent repeat [facility] which demonstrated occlusion of the SVG to the ramus intermedius. The native vessel does have moderate ostial stenosis but has TIMI 3 flow without other obstructive disease. It is possible that with occlusion of the SVG there may have been some thrombus showered downstream. His remaining 3 grafts were patent. Medical management was recommended. He was continued on ASA, a statin, BB and ARB. Plavix and a long acting nitrate were added to his regimen. He was also prescribed PRN SL NTG. He did well with medical therapy.  Patient was last seen in clinic on 07/07/20 where he was having more dyspnea on exertion and intermittent exertional chest pain. Myoview 07/16/20 with no evidence of ischemia or infarction. EF 47%. Lung cancer screening CT with no nodules, underlying CAD and aortic sclerosis and COPD.  Today, the patient states he is doing overall better since losing weight. No chest pain, lightheadedness, dizziness, orthopnea, or PND. Has some nausea with the ozempic but has lost 15lbs, which he is very excited about. Has been working on diet and cut out sweets.  Active at work and feels overall well with exertion. Tolerating medications as prescribed. No bleeding issues.     Past Medical History:  Diagnosis Date   Anxiety    Depression    Hyperlipidemia    Hypertension    Lumbar herniated disc dx'd 03/2015   NSTEMI (non-ST elevated myocardial infarction) (HCC) 04/15/2015    Past Surgical History:  Procedure Laterality Date   BACK SURGERY     CARDIAC CATHETERIZATION  04/15/2015   CARDIAC CATHETERIZATION [name] 04/15/2015   Procedure: Left Heart Cath and Coronary Angiography;  Surgeon: Runell Gess, MD;  Location: [facility] INVASIVE CV LAB;  Service: Cardiovascular;  Laterality: [name];   CARDIAC CATHETERIZATION [name] 05/20/2015   Procedure: Left Heart Cath and Cors/Grafts Angiography;  Surgeon: Peter M Swaziland, MD;  Location: [facility] INVASIVE CV LAB;  Service: Cardiovascular;  Laterality: [name];   CORONARY ARTERY BYPASS GRAFT [name] 04/18/2015   Procedure: CORONARY ARTERY BYPASS GRAFTING times four with LIMA  to LAD, LEFT RADIAL ARTERY to OM, Right saphenous vein harvesting and utilized to Interm and to PD ENDOSCOPIC GREATER SAPHENOUS VEIN HARVEST (EVH) RIGHT THIGH;  Surgeon: Delight Ovens, MD;  Location: [facility] OR;  Service: Open Heart Surgery;  Laterality: [name];   LUMBAR LAMINECTOMY  2002   RADIAL ARTERY HARVEST Left 04/18/2015   Procedure:  LEFT Arm RADIAL ARTERY HARVEST;  Surgeon: Delight Ovens, MD;  Location: [facility] OR;  Service: Open Heart Surgery;  Laterality: Left;   TEE WITHOUT CARDIOVERSION [name] 04/18/2015   Procedure: TRANSESOPHAGEAL ECHOCARDIOGRAM (TEE);  Surgeon: Delight Ovens, MD;  Location: [facility] OR;  Service: Open Heart Surgery;  Laterality: [name];   TONSILLECTOMY  1960s    Current Medications: Current Meds  Medication Sig   alfuzosin (UROXATRAL) 10 MG 24 hr tablet TAKE 1 TABLET BY MOUTH EVERYDAY AT BEDTIME   amLODipine (NORVASC) 10 MG tablet Take 1 tablet (10 mg total) by mouth daily.   aspirin EC 81 MG EC tablet Take 1 tablet (81 mg total) by mouth  daily.   atorvastatin (LIPITOR) 40 MG tablet Take 1 tablet (40 mg total) by mouth daily.   clopidogrel (PLAVIX) 75 MG tablet Take 1 tablet (75 mg total) by mouth daily.   ezetimibe (ZETIA) 10 MG tablet Take 1 tablet (10 mg total) by mouth daily.   finasteride (PROSCAR) 5 MG tablet TAKE 1 TABLET BY MOUTH EVERY DAY   FLUoxetine (PROZAC) 20 MG tablet Take 30 mg by mouth daily.   hydrochlorothiazide (HYDRODIURIL) 25 MG tablet Take 1 tablet (25 mg total) by mouth daily.   icosapent Ethyl (VASCEPA) 1 g capsule Take 2 capsules (2 g total) by mouth 2 (two) times daily.   isosorbide mononitrate (IMDUR) 60 MG 24 hr tablet Take 1.5 tablets (90 mg total) by mouth daily.   levocetirizine (XYZAL) 5 MG tablet Take 5 mg by mouth every evening.   lisinopril (ZESTRIL) 40 MG tablet Take 1 tablet (40 mg total) by mouth daily.   MYRBETRIQ 25 MG TB24 tablet TAKE 1 TABLET BY MOUTH EVERY DAY   nebivolol (BYSTOLIC) 10 MG tablet Take 1 tablet (10 mg total) by mouth daily.   nitroGLYCERIN (NITROSTAT) 0.4 MG SL tablet Place 1 tablet (0.4 mg total) under the tongue every 5 (five) minutes x 3 doses as needed for chest pain.   pantoprazole (PROTONIX) 40 MG tablet Take 1 tablet (40 mg total) by mouth daily.   potassium chloride (KLOR-CON M10) 10 MEQ tablet Take 1 tablet (10 mEq total) by mouth daily. Please keep upcoming appointment in April 2022 for future refills. Thank you   Semaglutide,0.25 or 0.5MG /DOS, (OZEMPIC, 0.25 OR 0.5 MG/DOSE,) 2 MG/1.5ML SOPN Inject 0.25 mg into the skin once a week. Inject 0.25mg  into the skin once a week. After 4 weeks increase to 0.5mg  once a week   Semaglutide-Weight Management 1 MG/0.5ML SOAJ Inject 1 mg into the skin once a week for 28 days.   [START ON 10/31/2020] Semaglutide-Weight Management 1.7 MG/0.75ML SOAJ Inject 1.7 mg into the skin once a week for 28 days.   [START ON 11/29/2020] Semaglutide-Weight Management 2.4 MG/0.75ML SOAJ Inject 2.4 mg into the skin once a week.     Allergies:    Atorvastatin, Claritin [loratadine], Codeine, and Vicodin [hydrocodone-acetaminophen]   Social History   Socioeconomic History   Marital status: Married    Spouse name: Not on file   Number of children: Not on file   Years of education: Not on file   Highest education level: Not on file  Occupational History   Not on file  Tobacco Use   Smoking status: Some Days    Packs/day: 1.50    Years: 33.00    Pack years: 49.50    Types: Cigarettes    Last attempt to quit: 04/15/2015    Years since quitting: 5.5   Smokeless tobacco: Never  Vaping Use   Vaping Use: Never used  Substance and Sexual Activity   Alcohol use: Yes    Alcohol/week: 0.0 standard drinks    Comment: occasionally   Drug use:  Not Currently    Types: Marijuana, Cocaine    Comment: "in my teens"   Sexual activity: Not on file  Other Topics Concern   Not on file  Social History Narrative   Not on file   Social Determinants of Health   Financial Resource Strain: Not on file  Food Insecurity: Not on file  Transportation Needs: Not on file  Physical Activity: Not on file  Stress: Not on file  Social Connections: Not on file     Family History: The patient's family history includes Cancer in his father; Diabetes in his sister; Heart attack (age of onset: 3) in his father.  ROS:   Please see the history of present illness.    Review of Systems  Constitutional:  Negative for chills and fever.  HENT:  Negative for sore throat.   Eyes:  Negative for blurred vision.  Respiratory:  Positive for shortness of breath.   Cardiovascular:  Negative for chest pain, palpitations, orthopnea, claudication, leg swelling and PND.  Gastrointestinal:  Positive for nausea.  Genitourinary:  Negative for dysuria.  Musculoskeletal:  Negative for falls.  Neurological:  Negative for dizziness and loss of consciousness.  Psychiatric/Behavioral:  Positive for substance abuse.     EKGs/Labs/Other Studies Reviewed:    The  following studies were reviewed today: Myoview 07/16/20: Nuclear stress EF: 47%. There was no ST segment deviation noted during stress. The study is normal. This is a low risk study.   Low risk stress nuclear study with normal perfusion and mildly reduced global systolic function. Consider echo correlation. TTE 07/18/19:  1. Inferior basal hypokinesis . Left ventricular ejection fraction, by  estimation, is 60%. The left ventricle has normal function. The left  ventricle has no regional wall motion abnormalities. Left ventricular  diastolic parameters were normal.   2. Right ventricular systolic function is normal. The right ventricular  size is normal.   3. Left atrial size was moderately dilated.   4. Right atrial size was moderately dilated.   5. The mitral valve is normal in structure. No evidence of mitral valve  regurgitation. No evidence of mitral stenosis.   6. The aortic valve is normal in structure. Aortic valve regurgitation is  not visualized. No aortic stenosis is present.   7. The inferior vena cava is normal in size with greater than 50%  respiratory variability, suggesting right atrial pressure of 3 mmHg.  CT lung caner screen 07/16/20: IMPRESSION: 1. Lung-RADS 2, benign appearance or behavior. Continue annual screening with low-dose chest CT without contrast in 12 months. 2. There is aortic atherosclerosis, in addition to left main and 3 vessel coronary artery disease. Status post median sternotomy for CABG including LIMA to the LAD. 3. Mild diffuse bronchial wall thickening with very mild centrilobular and paraseptal emphysema; imaging findings suggestive of underlying COPD.   Aortic Atherosclerosis (ICD10-I70.0) and Emphysema (ICD10-J43.9).   TTE: 04/2015 - Left ventricle: The cavity size was normal. Wall thickness was   increased in a pattern of mild LVH. Basal inferior severe   hypokinesis, inferolateral hypokinesis. Systolic function was low   normal to  mildly reduced. The estimated ejection fraction was in   the range of 50% to 55%. Doppler parameters are consistent with   abnormal left ventricular relaxation (grade 1 diastolic   dysfunction). - Aortic valve: There was no stenosis. - Ascending aorta: The ascending aorta was mildly dilated at 3.7   cm. - Mitral valve: There was no significant regurgitation. - Left atrium: The  atrium was mildly dilated. - Right ventricle: Poorly visualized. - Pulmonary arteries: No complete TR doppler jet so unable to   estimate PA systolic pressure. - Inferior vena cava: The vessel was normal in size. The   respirophasic diameter changes were in the normal range (>= 50%),   consistent with normal central venous pressure. - Pericardium, extracardiac: A trivial pericardial effusion was   identified.   Cath 2017: Mid Cx lesion, 95% stenosed. Mid Cx to Dist Cx lesion, 95% stenosed. Prox LAD to Mid LAD lesion, 50% stenosed. Ost Ramus lesion, 60% stenosed. Prox Cx lesion, 95% stenosed. Prox RCA-1 lesion, 75% stenosed. Prox RCA-2 lesion, 80% stenosed. Mid RCA lesion, 90% stenosed. Dist RCA lesion, 60% stenosed. RPDA lesion, 60% stenosed. SVG to the PDA was injected is very large, and is anatomically normal. There is competitive flow. SVG to the ramus intermediate was injected is very large. Mid Graft to Insertion lesion, 100% stenosed. Radial artery graft to the OM was injected is normal in caliber, and is anatomically normal. There is competitive flow. LIMA to the LAD was injected is normal in caliber, and is anatomically normal. The left ventricular systolic function is normal.   1. Severe 3 vessel obstructive CAD 2. Patent LIMA to the LAD 3. Patent free radial graft to the OM. This arises from the hood of the SVG to the ramus intermediate branch 4. Occluded mid SVG to the ramus intermediate with thrombus. 5. Patent SVG to the PDA 6. Good LV function.   Plan: It appears that the culprit  lesion is occlusion of the SVG to the ramus intermediate. The native vessel does have moderate ostial stenosis but has TIMI 3 flow without other obstructive disease. It is possible that with occlusion of the SVG there may have been some thrombus showered downstream. At this point would treat medically.   EKG:  No new ECG today  Recent Labs: No results found for requested labs within last 8760 hours.  Recent Lipid Panel    Component Value Date/Time   CHOL 148 07/07/2020 0854   TRIG 187 (H) 07/07/2020 0854   HDL 36 (L) 07/07/2020 0854   CHOLHDL 4.1 07/07/2020 0854   CHOLHDL 3.8 05/20/2015 0514   VLDL 22 05/20/2015 0514   LDLCALC 80 07/07/2020 0854         Physical Exam:    VS:  BP 124/80 (BP Location: Left Arm, Patient Position: Sitting, Cuff Size: Normal)   Pulse 82   Ht  (1.803 m)   Wt 251 lb (113.9 kg)   SpO2 97%   BMI 35.01 kg/m     Wt Readings from Last 3 Encounters:  10/13/20 251 lb (113.9 kg)  08/21/20 266 lb (120.7 kg)  07/16/20 269 lb (122 kg)     GEN:  Well nourished, well developed in no acute distress HEENT: Normal NECK: No JVD; No carotid bruits CARDIAC: RRR, no murmurs, rubs, gallops RESPIRATORY:  Clear to auscultation without rales, wheezing or rhonchi  ABDOMEN: Soft, non-tender, non-distended MUSCULOSKELETAL:  No edema; No deformity  SKIN: Warm and dry NEUROLOGIC:  Alert and oriented x 3 PSYCHIATRIC:  Normal affect   ASSESSMENT:    1. Coronary artery disease involving native coronary artery of native heart without angina pectoris   2. Bilateral carotid artery disease, unspecified type (HCC)   3. Tobacco abuse   4. Class 2 obesity with body mass index (BMI) of 37.0 to 37.9 in adult, unspecified obesity type, unspecified whether serious comorbidity present  5. Essential (primary) hypertension   6. S/P CABG x 4   7. Coronary artery disease due to lipid rich plaque   8. Hyperlipidemia, unspecified hyperlipidemia type   9. S/P CABG (coronary  artery bypass graft)    PLAN:    In order of problems listed above:  #Multivessel CAD s/p CABG x4 in 2016: Patient with NSTEMI in 03/2014 treated with CABG x 4 as detailed above. He had a second NSTEMI 04/2015 with cath revealing a failed SVG to RI. His other 3 grafts were patent.  LVEF low normal in 2017 with mild regional wall motion abnormalities now with LVEF 60% on TTE 06/2019 with inferobasal hypokinesis. Recent myoview 06/2020 without evidence of ischemia or infarction. -Myoview 06/2020 negative for ischemia -Continue ASA 81mg  daily; plavix 75mg  daily -Continue lipitor 40mg  daily and zetia 10mg  daily -Continue lisinopril 40mg  daily -Continue bystolic 10mg  daily -Continue imdur 90mg  daily   #Post operative atrial fibrillation:  Occurred post CABG and managed with short term amio. No recurrence since his bypass surgery. -Continue to monitor--off AC   #HTN: Well controlled and at goal <120s/80s. -Continue lisinopril 40mg  daily -Continue bystolic 10mg  daily -Continue amlodipine 10mg  daily -Continue imdur 90mg  daily   #Mixed Hyperlipidemia: Last LDL 02/18/20 66.  TG 273 -Continue lipitor 40mg  daily and zetia 10mg  daily -Check lipid panel today   #Tobacco Abuse:  Continues to smoke about 1/2 pack per day -Encourage complete cessation -Encouraged nicotine patches -Needs yearly CT screening' last 06/2020 without nodules  #Mild Carotid Artery Disease: 1-39% bilaterally in 2017. -Repeat carotid ultrasiund for monitoring -Continue ASA, zetia and lipitor as above   #Obesity: BMI down to 35. Tolerating ozempic and lost 15lbs! -Continue ozempic -Continue healthy diet and weight loss efforts as detailed below   Exercise recommendations: Goal of exercising for at least 30 minutes a day, at least 5 times per week.  Please exercise to a moderate exertion.  This means that while exercising it is difficult to speak in full sentences, however you are not so short of breath that you feel  you must stop, and not so comfortable that you can carry on a full conversation.  Exertion level should be approximately a 5/10, if 10 is the most exertion you can perform.   Diet recommendations: Recommend a heart healthy diet such as the Mediterranean diet.  This diet consists of plant based foods, healthy fats, lean meats, olive oil.  It suggests limiting the intake of simple carbohydrates such as white breads, pastries, and pastas.  It also limits the amount of red meat, wine, and dairy products such as cheese that one should consume on a daily basis.     Medication Adjustments/Labs and Tests Ordered: Current medicines are reviewed at length with the patient today.  Concerns regarding medicines are outlined above.  Orders Placed This Encounter  Procedures   Lipid Profile   Basic metabolic panel   VAS CAROTID   No orders of the defined types were placed in this encounter.   Patient Instructions  Medication Instructions:   Your physician recommends that you continue on your current medications as directed. Please refer to the Current Medication list given to you today.  *If you need a refill on your cardiac medications before your next appointment, please call your pharmacy*   Lab Work:  TODAY--LIPIDS AND BMET  If you have labs (blood work) drawn today and your tests are completely normal, you will receive your results only by: MyChart Message (if you have  MyChart) OR A paper copy in the mail If you have any lab test that is abnormal or we need to change your treatment, we will call you to review the results.   Testing/Procedures:  Your physician has requested that you have a carotid duplex. This test is an ultrasound of the carotid arteries in your neck. It looks at blood flow through these arteries that supply the brain with blood. Allow one hour for this exam. There are no restrictions or special instructions.  Follow-Up: At [facility], you and your health needs  are our priority.  As part of our continuing mission to provide you with exceptional heart care, we have created designated Provider Care Teams.  These Care Teams include your primary Cardiologist (physician) and Advanced Practice Providers (APPs -  Physician Assistants and Nurse Practitioners) who all work together to provide you with the care you need, when you need it.  We recommend signing up for the patient portal called "MyChart".  Sign up information is provided on this After Visit Summary.  MyChart is used to connect with patients for Virtual Visits (Telemedicine).  Patients are able to view lab/test results, encounter notes, upcoming appointments, etc.  Non-urgent messages can be sent to your provider as well.   To learn more about what you can do with MyChart, go to [email or web address].    Your next appointment:   6 month(s)  The format for your next appointment:   In Person  Provider:   You may see DR. Laticia Vannostrand  or one of the following Advanced Practice Providers on your designated Care Team:   Tereso NewcomerScott Weaver, PA-C Chelsea AusVin Bhagat, [place]C      Signed, Meriam SpragueHeather E Zackery Brine, MD  10/13/2020 8:38 AM    [facility]

## 2020-10-13 ENCOUNTER — Encounter: Payer: Self-pay | Admitting: Cardiology

## 2020-10-13 ENCOUNTER — Other Ambulatory Visit: Payer: Self-pay

## 2020-10-13 ENCOUNTER — Ambulatory Visit: Payer: BLUE CROSS/BLUE SHIELD | Admitting: Cardiology

## 2020-10-13 VITALS — BP 124/80 | HR 82 | Ht 71.0 in | Wt 251.0 lb

## 2020-10-13 DIAGNOSIS — I2583 Coronary atherosclerosis due to lipid rich plaque: Secondary | ICD-10-CM

## 2020-10-13 DIAGNOSIS — I2510 Atherosclerotic heart disease of native coronary artery without angina pectoris: Secondary | ICD-10-CM | POA: Diagnosis not present

## 2020-10-13 DIAGNOSIS — I10 Essential (primary) hypertension: Secondary | ICD-10-CM

## 2020-10-13 DIAGNOSIS — Z6837 Body mass index (BMI) 37.0-37.9, adult: Secondary | ICD-10-CM

## 2020-10-13 DIAGNOSIS — E785 Hyperlipidemia, unspecified: Secondary | ICD-10-CM

## 2020-10-13 DIAGNOSIS — Z720 Tobacco use: Secondary | ICD-10-CM | POA: Diagnosis not present

## 2020-10-13 DIAGNOSIS — I779 Disorder of arteries and arterioles, unspecified: Secondary | ICD-10-CM | POA: Diagnosis not present

## 2020-10-13 DIAGNOSIS — E669 Obesity, unspecified: Secondary | ICD-10-CM | POA: Diagnosis not present

## 2020-10-13 DIAGNOSIS — Z951 Presence of aortocoronary bypass graft: Secondary | ICD-10-CM

## 2020-10-13 LAB — BASIC METABOLIC PANEL
BUN/Creatinine Ratio: 13 (ref 9–20)
BUN: 12 mg/dL (ref 6–24)
CO2: 22 mmol/L (ref 20–29)
Calcium: 9.7 mg/dL (ref 8.7–10.2)
Chloride: 102 mmol/L (ref 96–106)
Creatinine, Ser: 0.94 mg/dL (ref 0.76–1.27)
Glucose: 99 mg/dL (ref 65–99)
Potassium: 3.5 mmol/L (ref 3.5–5.2)
Sodium: 142 mmol/L (ref 134–144)
eGFR: 95 mL/min/{1.73_m2} (ref >59)

## 2020-10-13 LAB — LIPID PANEL
Chol/HDL Ratio: 3.7 ratio (ref 0.0–5.0)
Cholesterol, Total: 134 mg/dL (ref 100–199)
HDL: 36 mg/dL — ABNORMAL LOW (ref >39)
LDL Chol Calc (NIH): 60 mg/dL (ref 0–99)
Triglycerides: 232 mg/dL — ABNORMAL HIGH (ref 0–149)
VLDL Cholesterol Cal: 38 mg/dL (ref 5–40)

## 2020-10-13 NOTE — Telephone Encounter (Signed)
Pt picked up Saxenda samples today. He is aware of 5 week Saxenda dose titration. Already has Wegovy 1.7mg  and 2.4mg  at home for after he has completed Saxenda titration.

## 2020-10-13 NOTE — Patient Instructions (Signed)
Medication Instructions:   Your physician recommends that you continue on your current medications as directed. Please refer to the Current Medication list given to you today.  *If you need a refill on your cardiac medications before your next appointment, please call your pharmacy*   Lab Work:  TODAY--LIPIDS AND BMET  If you have labs (blood work) drawn today and your tests are completely normal, you will receive your results only by: MyChart Message (if you have MyChart) OR A paper copy in the mail If you have any lab test that is abnormal or we need to change your treatment, we will call you to review the results.   Testing/Procedures:  Your physician has requested that you have a carotid duplex. This test is an ultrasound of the carotid arteries in your neck. It looks at blood flow through these arteries that supply the brain with blood. Allow one hour for this exam. There are no restrictions or special instructions.  Follow-Up: At [facility], you and your health needs are our priority.  As part of our continuing mission to provide you with exceptional heart care, we have created designated Provider Care Teams.  These Care Teams include your primary Cardiologist (physician) and Advanced Practice Providers (APPs -  Physician Assistants and Nurse Practitioners) who all work together to provide you with the care you need, when you need it.  We recommend signing up for the patient portal called "MyChart".  Sign up information is provided on this After Visit Summary.  MyChart is used to connect with patients for Virtual Visits (Telemedicine).  Patients are able to view lab/test results, encounter notes, upcoming appointments, etc.  Non-urgent messages can be sent to your provider as well.   To learn more about what you can do with MyChart, go to [email or web address].    Your next appointment:   6 month(s)  The format for your next appointment:   In Person  Provider:   You may  see DR. PEMBERTON  or one of the following Advanced Practice Providers on your designated Care Team:   Tereso Newcomer, PA-C Vin Tyrone, [place]

## 2020-10-13 NOTE — Addendum Note (Signed)
Addended by: Joella Saefong E on: 10/13/2020 08:58 AM   Modules accepted: Orders

## 2020-10-14 ENCOUNTER — Telehealth: Payer: Self-pay | Admitting: *Deleted

## 2020-10-14 DIAGNOSIS — E782 Mixed hyperlipidemia: Secondary | ICD-10-CM

## 2020-10-14 DIAGNOSIS — I2510 Atherosclerotic heart disease of native coronary artery without angina pectoris: Secondary | ICD-10-CM

## 2020-10-14 DIAGNOSIS — Z951 Presence of aortocoronary bypass graft: Secondary | ICD-10-CM

## 2020-10-14 DIAGNOSIS — E785 Hyperlipidemia, unspecified: Secondary | ICD-10-CM

## 2020-10-14 DIAGNOSIS — Z79899 Other long term (current) drug therapy: Secondary | ICD-10-CM

## 2020-10-14 MED ORDER — FENOFIBRATE 145 MG PO TABS: 145.0000 mg | ORAL_TABLET | Freq: Every day | ORAL | 1 refills | Status: DC

## 2020-10-14 NOTE — Telephone Encounter (Signed)
Pt made aware of lab results and recommendations per Dr. Shari Prows. Noted in pts med list that he is already taking Vascepa 2 grams po bid.  Will need to clarify med recommendations with Dr. Shari Prows about this.   Went ahead and scheduled the pt to come into the office for repeat lipids in 3 months on 01/16/21.  He is aware to come fasting.  Will clarify with Dr. Shari Prows and follow-up with the pt accordingly thereafter.   Clarification received by Dr. Shari Prows.  Pt should continue his vascepa and start fenofibrate 145 mg po daily, still come in for lipids on 10/28. Pt made aware of this and pharmacy confirmed.  Pt verbalized understanding and agrees with this plan.

## 2020-10-14 NOTE — Telephone Encounter (Signed)
-----   Message from Meriam Sprague, MD sent at 10/13/2020  8:07 PM EDT ----- His cholesterol looks great but his triglycerides are a little above goal. Can we start him on vascepa 2g BID to try to get them down to <200. Repeat lipid panel in 3 months.

## 2020-10-22 ENCOUNTER — Other Ambulatory Visit: Payer: Self-pay

## 2020-10-22 ENCOUNTER — Ambulatory Visit (HOSPITAL_COMMUNITY)
Admission: RE | Admit: 2020-10-22 | Discharge: 2020-10-22 | Disposition: A | Discharge status: Home / Self Care | Payer: BLUE CROSS/BLUE SHIELD | Source: Ambulatory Visit | Attending: Cardiovascular Disease | Admitting: Cardiovascular Disease

## 2020-10-22 DIAGNOSIS — I2583 Coronary atherosclerosis due to lipid rich plaque: Secondary | ICD-10-CM | POA: Diagnosis not present

## 2020-10-22 DIAGNOSIS — I2510 Atherosclerotic heart disease of native coronary artery without angina pectoris: Secondary | ICD-10-CM | POA: Insufficient documentation

## 2020-10-22 DIAGNOSIS — Z6837 Body mass index (BMI) 37.0-37.9, adult: Secondary | ICD-10-CM | POA: Diagnosis not present

## 2020-10-22 DIAGNOSIS — Z951 Presence of aortocoronary bypass graft: Secondary | ICD-10-CM | POA: Insufficient documentation

## 2020-10-22 DIAGNOSIS — I779 Disorder of arteries and arterioles, unspecified: Secondary | ICD-10-CM | POA: Insufficient documentation

## 2020-10-22 DIAGNOSIS — Z720 Tobacco use: Secondary | ICD-10-CM | POA: Diagnosis not present

## 2020-10-22 DIAGNOSIS — E785 Hyperlipidemia, unspecified: Secondary | ICD-10-CM | POA: Diagnosis not present

## 2020-10-22 DIAGNOSIS — I10 Essential (primary) hypertension: Secondary | ICD-10-CM | POA: Diagnosis not present

## 2020-10-22 DIAGNOSIS — E669 Obesity, unspecified: Secondary | ICD-10-CM | POA: Diagnosis not present

## 2020-10-30 ENCOUNTER — Other Ambulatory Visit: Payer: Self-pay

## 2020-10-30 MED ORDER — ICOSAPENT ETHYL 1 G PO CAPS: 2.0000 g | ORAL_CAPSULE | Freq: Two times a day (BID) | ORAL | 2 refills | Status: DC

## 2020-11-18 ENCOUNTER — Telehealth: Payer: Self-pay | Admitting: Pharmacist

## 2020-11-18 NOTE — Telephone Encounter (Signed)
Called patient. He is doing well on Saxenda. States he is down to 235lb (from 251). He is due to transition to wegovy 1.7mg  soon. His last dose of Saxenda will be Friday. I advised he start Wegovy 1.7mg  weekly on Sunday. 3 month f/u scheduled for 9/12

## 2020-11-21 DIAGNOSIS — R3912 Poor urinary stream: Secondary | ICD-10-CM | POA: Diagnosis not present

## 2020-11-21 DIAGNOSIS — N401 Enlarged prostate with lower urinary tract symptoms: Secondary | ICD-10-CM | POA: Diagnosis not present

## 2020-12-01 ENCOUNTER — Other Ambulatory Visit: Payer: Self-pay

## 2020-12-01 ENCOUNTER — Ambulatory Visit: Payer: BLUE CROSS/BLUE SHIELD | Admitting: Pharmacist

## 2020-12-01 VITALS — BP 140/90 | HR 74 | Wt 233.4 lb

## 2020-12-01 DIAGNOSIS — E669 Obesity, unspecified: Secondary | ICD-10-CM

## 2020-12-01 DIAGNOSIS — Z6837 Body mass index (BMI) 37.0-37.9, adult: Secondary | ICD-10-CM | POA: Diagnosis not present

## 2020-12-01 NOTE — Progress Notes (Signed)
Patient ID: [name]                 DOB: Jul 11, 1965                    MRN: [id number]     HPI: [name] is a 55 y.o. male patient referred to pharmacy clinic by Dr. Shari Prows to initiate weight loss therapy with GLP1-RA. 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 Tyrone Sage (LIMA to LAD, Left radial artery to OM, SVG to Intermediate, and SVG to PD), post operative atrial fibrillation managed with amiodarone, HTN, depression and anxiety. Most recent BMI 37 kg/m2. Most recent BMI 32.55, previous BMI 37.1 kg/m2.  Started medications on 09/06/20. Ozempic x 4 weeks of 0.25mg  dose and 2 weeks of 0.5mg  dose due to supply issues with Wegovy. Wegovy  0.5mg  was not available. Patient was switched to [facility] and given samples starting at 1.2 mg and titrated every week up to 3mg  x 2 weeks. Then transitioned to [facility] 1.7mg  weekly. Currently on Wegovy 1.7 mg for the past 2 weeks.  Presents today in good spirits and states he feels pretty good with getting some weight off. He states no issues with the current medication. He has tried to make healthier choices with his meals and has noticed decrease in appetite, which has helped him cut back on portions. He still has not been able to increase his exercise, but states he is active at work.   He is working on cutting back on his smoking and has started using nicotine patches and is down to smoking 1/4 to 1/2 ppd. Does not think he can stop smoking and loose weight at the same time.  Follow-up visit  % weight loss 12.3% (current weight 233.4lbs) adverse effects- none, BP 140/90, HR 74bpm  Missed doses-none  Current weight management medications:  Semaglutide 1.7 mg SQ weekly  Previously tried meds: none  Current meds that may affect weight: none  Baseline weight/BMI:37.1 kg/m2  Insurance payor: [employer]  Diet:  Eating a lot less with Wegovy Trying to cut back on  sugar -Breakfast: skips breakfast -Lunch: 1/2 sandwich, [facility] or ham on rye bread and banana  -Dinner: processed meals, but 1/2 the meal -Snacks:can on pears in syrup -Drinks: regular tea, water, coffee with cream, stopped drinking Gatorade   Exercise:  Not much with work  Does [place] for work  Family History: The patient's family history includes Cancer in his father; Diabetes in his sister; Heart attack (age of onset: 13) in his father.  Social History: Stated using nicotine patches, smokes 1/4 to 1/2 ppd, trying to cut back  Labs: Lab Results  Component Value Date   HGBA1C 6.0 (H) 08/21/2020    Wt Readings from Last 1 Encounters:  10/13/20 251 lb (113.9 kg)    BP Readings from Last 1 Encounters:  10/13/20 124/80   Pulse Readings from Last 1 Encounters:  10/13/20 82       Component Value Date/Time   CHOL 134 10/13/2020 0840   TRIG 232 (H) 10/13/2020 0840   HDL 36 (L) 10/13/2020 0840   CHOLHDL 3.7 10/13/2020 0840   CHOLHDL 3.8 05/20/2015 0514   VLDL 22 05/20/2015 0514   LDLCALC 60 10/13/2020 0840    Past Medical History:  Diagnosis Date   Anxiety    Depression    Hyperlipidemia    Hypertension    Lumbar herniated disc dx'd  03/2015   NSTEMI (non-ST elevated myocardial infarction) (HCC) 04/15/2015    Current Outpatient Medications on File Prior to Visit  Medication Sig Dispense Refill   alfuzosin (UROXATRAL) 10 MG 24 hr tablet TAKE 1 TABLET BY MOUTH EVERYDAY AT BEDTIME 90 tablet 3   amLODipine (NORVASC) 10 MG tablet Take 1 tablet (10 mg total) by mouth daily. 90 tablet 3   aspirin EC 81 MG EC tablet Take 1 tablet (81 mg total) by mouth daily.     atorvastatin (LIPITOR) 40 MG tablet Take 1 tablet (40 mg total) by mouth daily. 90 tablet 3   clopidogrel (PLAVIX) 75 MG tablet Take 1 tablet (75 mg total) by mouth daily. 90 tablet 3   ezetimibe (ZETIA) 10 MG tablet Take 1 tablet (10 mg total) by mouth daily. 90 tablet 3   fenofibrate (TRICOR) 145 MG tablet  Take 1 tablet (145 mg total) by mouth daily. 90 tablet 1   finasteride (PROSCAR) 5 MG tablet TAKE 1 TABLET BY MOUTH EVERY DAY 90 tablet 3   FLUoxetine (PROZAC) 20 MG tablet Take 30 mg by mouth daily.  1   hydrochlorothiazide (HYDRODIURIL) 25 MG tablet Take 1 tablet (25 mg total) by mouth daily. 90 tablet 3   icosapent Ethyl (VASCEPA) 1 g capsule Take 2 capsules (2 g total) by mouth 2 (two) times daily. 360 capsule 2   isosorbide mononitrate (IMDUR) 60 MG 24 hr tablet Take 1.5 tablets (90 mg total) by mouth daily. 135 tablet 3   levocetirizine (XYZAL) 5 MG tablet Take 5 mg by mouth every evening.     lisinopril (ZESTRIL) 40 MG tablet Take 1 tablet (40 mg total) by mouth daily. 90 tablet 3   MYRBETRIQ 25 MG TB24 tablet TAKE 1 TABLET BY MOUTH EVERY DAY 30 tablet 3   nebivolol (BYSTOLIC) 10 MG tablet Take 1 tablet (10 mg total) by mouth daily. 90 tablet 3   nitroGLYCERIN (NITROSTAT) 0.4 MG SL tablet Place 1 tablet (0.4 mg total) under the tongue every 5 (five) minutes x 3 doses as needed for chest pain. 25 tablet 2   pantoprazole (PROTONIX) 40 MG tablet Take 1 tablet (40 mg total) by mouth daily. 90 tablet 2   potassium chloride (KLOR-CON M10) 10 MEQ tablet Take 1 tablet (10 mEq total) by mouth daily. Please keep upcoming appointment in April 2022 for future refills. Thank you 90 tablet 3   Semaglutide-Weight Management 2.4 MG/0.75ML SOAJ Inject 2.4 mg into the skin once a week. 3 mL 6   No current facility-administered medications on file prior to visit.    Allergies  Allergen Reactions   Atorvastatin Other (See Comments)    80 mg dose legs cramps, tolerates 40 mg dose   Claritin [Loratadine] Other (See Comments)    Makes allergies worse-"a lot worse"   Codeine Nausea And Vomiting   Vicodin [Hydrocodone-Acetaminophen] Nausea And Vomiting    "pretty much any codeine"     Assessment/Plan:  1. Weight loss - Patient has lost >5% of body weight. Will continue KGMWNU. Patient feels better with  the decrease  in weight.  We discussed diet and continuing to cut back on sugar. Patient is not amendable to trying meal prep services due to cost. Reiterated trying to increase vegetables.  Will check A1c.  Patient instructed to continue with 1.7 mg dose of Wegovy and will increase to 2.4 mg on 12/20/20.   Patient BP elevated in clinic today. He states he drank a coffee on the way  over to his appointment. Last reading in clinic was at goal.  Follow up in 3 months.   Thanks,  Olene Floss, Pharm.D, BCPS, CPP [facility]  [address] [place], [place] [address]  Phone: [phone]; Fax: [phone]

## 2020-12-01 NOTE — Patient Instructions (Addendum)
Continue Wegovy 1.7mg  weekly. On Oct 1 increase to [facility] 2.4mg  weekly  Try to increase physical activity on the weekends-can be as simple as a 30 min walk    Call me with any [phone]

## 2020-12-02 LAB — HEMOGLOBIN A1C
Est. average glucose Bld gHb Est-mCnc: 111 mg/dL
Hgb A1c MFr Bld: 5.5 % (ref 4.8–5.6)

## 2021-01-16 ENCOUNTER — Other Ambulatory Visit: Payer: BLUE CROSS/BLUE SHIELD | Admitting: *Deleted

## 2021-01-16 ENCOUNTER — Other Ambulatory Visit: Payer: Self-pay

## 2021-01-16 DIAGNOSIS — I2510 Atherosclerotic heart disease of native coronary artery without angina pectoris: Secondary | ICD-10-CM

## 2021-01-16 DIAGNOSIS — E785 Hyperlipidemia, unspecified: Secondary | ICD-10-CM

## 2021-01-16 DIAGNOSIS — I2583 Coronary atherosclerosis due to lipid rich plaque: Secondary | ICD-10-CM | POA: Diagnosis not present

## 2021-01-16 DIAGNOSIS — E782 Mixed hyperlipidemia: Secondary | ICD-10-CM

## 2021-01-16 DIAGNOSIS — Z79899 Other long term (current) drug therapy: Secondary | ICD-10-CM | POA: Diagnosis not present

## 2021-01-16 DIAGNOSIS — Z951 Presence of aortocoronary bypass graft: Secondary | ICD-10-CM

## 2021-01-17 LAB — LIPID PANEL
Chol/HDL Ratio: 2.9 ratio (ref 0.0–5.0)
Cholesterol, Total: 115 mg/dL (ref 100–199)
HDL: 40 mg/dL (ref >39)
LDL Chol Calc (NIH): 59 mg/dL (ref 0–99)
Triglycerides: 82 mg/dL (ref 0–149)
VLDL Cholesterol Cal: 16 mg/dL (ref 5–40)

## 2021-01-22 ENCOUNTER — Other Ambulatory Visit: Payer: Self-pay

## 2021-01-22 ENCOUNTER — Encounter: Payer: Self-pay | Admitting: Family Medicine

## 2021-01-22 ENCOUNTER — Ambulatory Visit: Payer: BLUE CROSS/BLUE SHIELD | Admitting: Family Medicine

## 2021-01-22 VITALS — BP 140/94 | HR 67 | Temp 98.4°F | Resp 16 | Ht 71.0 in | Wt 216.0 lb

## 2021-01-22 DIAGNOSIS — Z7689 Persons encountering health services in other specified circumstances: Secondary | ICD-10-CM

## 2021-01-22 DIAGNOSIS — F419 Anxiety disorder, unspecified: Secondary | ICD-10-CM

## 2021-01-22 DIAGNOSIS — E785 Hyperlipidemia, unspecified: Secondary | ICD-10-CM | POA: Diagnosis not present

## 2021-01-22 DIAGNOSIS — F32A Depression, unspecified: Secondary | ICD-10-CM

## 2021-01-22 DIAGNOSIS — Z23 Encounter for immunization: Secondary | ICD-10-CM | POA: Diagnosis not present

## 2021-01-22 DIAGNOSIS — I10 Essential (primary) hypertension: Secondary | ICD-10-CM

## 2021-01-22 DIAGNOSIS — K219 Gastro-esophageal reflux disease without esophagitis: Secondary | ICD-10-CM

## 2021-01-22 MED ORDER — FLUOXETINE HCL 10 MG PO TABS: 30.0000 mg | ORAL_TABLET | Freq: Every day | ORAL | 1 refills | Status: DC

## 2021-01-22 MED ORDER — PANTOPRAZOLE SODIUM 40 MG PO TBEC: 40.0000 mg | DELAYED_RELEASE_TABLET | Freq: Every day | ORAL | 1 refills | Status: DC

## 2021-01-22 NOTE — Progress Notes (Signed)
Patient is to provider. Patient would like a refill on medication

## 2021-01-22 NOTE — Progress Notes (Signed)
New Patient Office Visit  Subjective:  Patient ID: [name], male    DOB: 02-May-1965  Age: 55 y.o. MRN: [id number]  CC:  Chief Complaint  Patient presents with   Establish Care    HPI [name] presents for to establish care and for review of chronic med issues with med refills. Patient denies acute complaints or concerns.   Past Medical History:  Diagnosis Date   Anxiety    Depression    Hyperlipidemia    Hypertension    Lumbar herniated disc dx'd 03/2015   NSTEMI (non-ST elevated myocardial infarction) (HCC) 04/15/2015    Past Surgical History:  Procedure Laterality Date   BACK SURGERY     CARDIAC CATHETERIZATION  04/15/2015   CARDIAC CATHETERIZATION [name] 04/15/2015   Procedure: Left Heart Cath and Coronary Angiography;  Surgeon: Runell Gess, MD;  Location: [facility] INVASIVE CV LAB;  Service: Cardiovascular;  Laterality: [name];   CARDIAC CATHETERIZATION [name] 05/20/2015   Procedure: Left Heart Cath and Cors/Grafts Angiography;  Surgeon: Peter M Swaziland, MD;  Location: [facility] INVASIVE CV LAB;  Service: Cardiovascular;  Laterality: [name];   CORONARY ARTERY BYPASS GRAFT [name] 04/18/2015   Procedure: CORONARY ARTERY BYPASS GRAFTING times four with LIMA  to LAD, LEFT RADIAL ARTERY to OM, Right saphenous vein harvesting and utilized to Interm and to PD ENDOSCOPIC GREATER SAPHENOUS VEIN HARVEST (EVH) RIGHT THIGH;  Surgeon: Delight Ovens, MD;  Location: [facility] OR;  Service: Open Heart Surgery;  Laterality: [name];   LUMBAR LAMINECTOMY  2002   RADIAL ARTERY HARVEST Left 04/18/2015   Procedure:  LEFT Arm RADIAL ARTERY HARVEST;  Surgeon: Delight Ovens, MD;  Location: [facility] OR;  Service: Open Heart Surgery;  Laterality: Left;   TEE WITHOUT CARDIOVERSION [name] 04/18/2015   Procedure: TRANSESOPHAGEAL ECHOCARDIOGRAM (TEE);  Surgeon: Delight Ovens, MD;  Location: [facility] OR;  Service: Open Heart Surgery;  Laterality: [name];   TONSILLECTOMY  1960s    Family History  Problem Relation Age of Onset    Heart attack Father 42       Deceased at this age   Cancer Father        Lung   Diabetes Sister     Social History   Socioeconomic History   Marital status: Married    Spouse name: Not on file   Number of children: Not on file   Years of education: Not on file   Highest education level: Not on file  Occupational History   Not on file  Tobacco Use   Smoking status: Some Days    Packs/day: 1.50    Years: 33.00    Pack years: 49.50    Types: Cigarettes    Last attempt to quit: 04/15/2015    Years since quitting: 5.7   Smokeless tobacco: Never  Vaping Use   Vaping Use: Never used  Substance and Sexual Activity   Alcohol use: Yes    Alcohol/week: 0.0 standard drinks    Comment: occasionally   Drug use: Not Currently    Types: Marijuana, Cocaine    Comment: "in my teens"   Sexual activity: Not on file  Other Topics Concern   Not on file  Social History Narrative   Not on file   Social Determinants of Health   Financial Resource Strain: Not on file  Food Insecurity: Not on file  Transportation Needs: Not on file  Physical Activity: Not on file  Stress: Not on file  Social Connections: Not on file  Intimate Partner Violence: Not on file    ROS Review of Systems  Cardiovascular: Negative.   Gastrointestinal: Negative.   All other systems reviewed and are negative.  Objective:   Today's Vitals: BP (!) 140/94   Pulse 67   Temp 98.4 F (36.9 C) (Oral)   Resp 16   Ht 5\' 11"  (1.803 m)   Wt 216 lb (98 kg)   SpO2 98%   BMI 30.13 kg/m   Physical Exam Vitals and nursing note reviewed.  Constitutional:      General: He is not in acute distress. Cardiovascular:     Rate and Rhythm: Normal rate and regular rhythm.  Pulmonary:     Effort: Pulmonary effort is normal.     Breath sounds: Normal breath sounds.  Abdominal:     Palpations: Abdomen is soft.     Tenderness: There is no abdominal tenderness.  Musculoskeletal:     Right lower leg: No edema.      Left lower leg: No edema.  Neurological:     General: No focal deficit present.     Mental Status: He is alert and oriented to person, place, and time.  Psychiatric:        Mood and Affect: Mood normal.        Behavior: Behavior normal.    Assessment & Plan:   1. Essential hypertension Slightly elevated readings. Patient is under care of consultant. Will defer any changes in management to them.   2. Hyperlipidemia, unspecified hyperlipidemia type Continue present management  3. Anxiety and depression Appears stable. Continue present management. Meds refilled.  - FLUoxetine (PROZAC) 10 MG tablet; Take 3 tablets (30 mg total) by mouth daily.  Dispense: 270 tablet; Refill: 1  4. Gastroesophageal reflux disease without esophagitis Appears stable. Continue present management. Meds refilled - pantoprazole (PROTONIX) 40 MG tablet; Take 1 tablet (40 mg total) by mouth daily.  Dispense: 90 tablet; Refill: 1  5. Need for influenza vaccination  - Flu Vaccine QUAD 106mo+IM (Fluarix, Fluzone & Alfiuria Quad PF)  6. Need for shingles vaccine  - Varicella-zoster vaccine IM  7. Encounter to establish care      Outpatient Encounter Medications as of 01/22/2021  Medication Sig   alfuzosin (UROXATRAL) 10 MG 24 hr tablet TAKE 1 TABLET BY MOUTH EVERYDAY AT BEDTIME   amLODipine (NORVASC) 10 MG tablet Take 1 tablet (10 mg total) by mouth daily.   aspirin EC 81 MG EC tablet Take 1 tablet (81 mg total) by mouth daily.   atorvastatin (LIPITOR) 40 MG tablet Take 1 tablet (40 mg total) by mouth daily.   clopidogrel (PLAVIX) 75 MG tablet Take 1 tablet (75 mg total) by mouth daily.   ezetimibe (ZETIA) 10 MG tablet Take 1 tablet (10 mg total) by mouth daily.   fenofibrate (TRICOR) 145 MG tablet Take 1 tablet (145 mg total) by mouth daily.   finasteride (PROSCAR) 5 MG tablet TAKE 1 TABLET BY MOUTH EVERY DAY   FLUoxetine (PROZAC) 20 MG tablet Take 30 mg by mouth daily.   hydrochlorothiazide  (HYDRODIURIL) 25 MG tablet Take 1 tablet (25 mg total) by mouth daily.   icosapent Ethyl (VASCEPA) 1 g capsule Take 2 capsules (2 g total) by mouth 2 (two) times daily.   isosorbide mononitrate (IMDUR) 60 MG 24 hr tablet Take 1.5 tablets (90 mg total) by mouth daily.   levocetirizine (XYZAL) 5 MG tablet Take 5 mg by mouth every evening.   lisinopril (ZESTRIL) 40 MG tablet Take  1 tablet (40 mg total) by mouth daily.   MYRBETRIQ 25 MG TB24 tablet TAKE 1 TABLET BY MOUTH EVERY DAY   nebivolol (BYSTOLIC) 10 MG tablet Take 1 tablet (10 mg total) by mouth daily.   nitroGLYCERIN (NITROSTAT) 0.4 MG SL tablet Place 1 tablet (0.4 mg total) under the tongue every 5 (five) minutes x 3 doses as needed for chest pain.   pantoprazole (PROTONIX) 40 MG tablet Take 1 tablet (40 mg total) by mouth daily.   potassium chloride (KLOR-CON M10) 10 MEQ tablet Take 1 tablet (10 mEq total) by mouth daily. Please keep upcoming appointment in April 2022 for future refills. Thank you   Semaglutide-Weight Management 2.4 MG/0.75ML SOAJ Inject 2.4 mg into the skin once a week.   No facility-administered encounter medications on file as of 01/22/2021.    Follow-up: No follow-ups on file.   Tommie Raymond, MD

## 2021-02-17 ENCOUNTER — Other Ambulatory Visit: Payer: Self-pay

## 2021-02-17 ENCOUNTER — Ambulatory Visit: Payer: BLUE CROSS/BLUE SHIELD

## 2021-02-18 NOTE — Progress Notes (Signed)
Came for shingles shot but was not time for it. Made another appt to get it in Jan.

## 2021-02-23 ENCOUNTER — Other Ambulatory Visit: Payer: Self-pay

## 2021-02-23 ENCOUNTER — Ambulatory Visit: Payer: BLUE CROSS/BLUE SHIELD | Admitting: Pharmacist

## 2021-02-23 VITALS — BP 148/90 | Wt 217.0 lb

## 2021-02-23 DIAGNOSIS — Z6837 Body mass index (BMI) 37.0-37.9, adult: Secondary | ICD-10-CM | POA: Diagnosis not present

## 2021-02-23 DIAGNOSIS — I10 Essential (primary) hypertension: Secondary | ICD-10-CM

## 2021-02-23 DIAGNOSIS — E785 Hyperlipidemia, unspecified: Secondary | ICD-10-CM | POA: Diagnosis not present

## 2021-02-23 DIAGNOSIS — E669 Obesity, unspecified: Secondary | ICD-10-CM

## 2021-02-23 MED ORDER — SPIRONOLACTONE 25 MG PO TABS: 12.5000 mg | ORAL_TABLET | Freq: Every day | ORAL | 3 refills | Status: DC

## 2021-02-23 NOTE — Progress Notes (Signed)
Patient ID: Ozias Dicenzo                 DOB: Jan 12, 1966                    MRN: [id number]     HPI: [name] is a 55 y.o. male patient referred to pharmacy clinic by Dr. Shari Prows to initiate weight loss therapy with GLP1-RA. 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 Tyrone Sage (LIMA to LAD, Left radial artery to OM, SVG to Intermediate, and SVG to PD), post operative atrial fibrillation managed with amiodarone, HTN, depression and anxiety. Most recent BMI 30.27, previous BMI 37.1 kg/m2.  Started medications on 09/06/20. Ozempic x 4 weeks of 0.25mg  dose and 2 weeks of 0.5mg  dose due to supply issues with Wegovy. Wegovy  0.5mg  was not available. Patient was switched to [facility] and given samples starting at 1.2 mg and titrated every week up to 3mg  x 2 weeks. Then transitioned to [facility] 1.7mg  weekly then to [facility] 2.4mg  weekly. Currently on Wegovy 2.4 mg.  Presents today in good spirits and states he feels great. He is very happy with his weight loss and hopes to lose some more. He states no issues with the current medication. He has some nausea for about 12 hr after his injection. He moved the injection to Friday night and says its much better. He has tried to make healthier choices with his meals and has noticed decrease in appetite, which has helped him cut back on portions. He still has not been able to increase his exercise, but states he is active at work. He is not eating sweets like he use to and is not over eating.  He is working on cutting back on his smoking. Wants to quite but wants to lose some more weight fist. Does not think he can stop smoking and loose weight at the same time. BP is a little high today 148/90. States he has been getting 141/88 at home.  Follow-up visit  % weight loss 18.4% (current weight 217 lbs) He was wearing works boots and 2 pairs of pants- was in work boots at first visit too.  Weights 207 at home scale. His heaviest was 279lb.  Current weight management medications:  Semaglutide 2.4 mg SQ weekly  Previously tried meds: none  Current meds that may affect weight: none  Baseline weight/BMI: 266lb /37.1 kg/m2  Insurance payor: [employer]  Diet:  Eating a lot less with Wegovy Trying to cut back on sugar -Breakfast: skips breakfast -Lunch: 1/2 sandwich, [employer] or ham on rye bread and banana  -Dinner: processed meals, but 1/2 the meal -Snacks:can on pears in syrup -Drinks: regular tea, water, coffee with cream, stopped drinking Gatorade   Exercise:  Not much with work  Does [place] for work  Family History: The patient's family history includes Cancer in his father; Diabetes in his sister; Heart attack (age of onset: 26) in his father.  Social History: Stated using nicotine patches, smokes 1/4 to 1/2 ppd, trying to cut back- wants to quite in the future  Labs: Lab Results  Component Value Date   HGBA1C 5.5 12/01/2020    Wt Readings from Last 1 Encounters:  01/22/21 216 lb (98 kg)    BP Readings from Last 1 Encounters:  01/22/21 (!) 140/94   Pulse Readings from Last 1 Encounters:  01/22/21 67       Component Value  Date/Time   CHOL 115 01/16/2021 0732   TRIG 82 01/16/2021 0732   HDL 40 01/16/2021 0732   CHOLHDL 2.9 01/16/2021 0732   CHOLHDL 3.8 05/20/2015 0514   VLDL 22 05/20/2015 0514   LDLCALC 59 01/16/2021 0732    Past Medical History:  Diagnosis Date   Anxiety    Depression    Hyperlipidemia    Hypertension    Lumbar herniated disc dx'd 03/2015   NSTEMI (non-ST elevated myocardial infarction) (HCC) 04/15/2015    Current Outpatient Medications on File Prior to Visit  Medication Sig Dispense Refill   alfuzosin (UROXATRAL) 10 MG 24 hr tablet TAKE 1 TABLET BY MOUTH EVERYDAY AT BEDTIME 90 tablet 3   amLODipine (NORVASC) 10 MG tablet Take 1 tablet (10 mg total) by mouth daily. 90 tablet 3   aspirin EC 81 MG EC tablet Take  1 tablet (81 mg total) by mouth daily.     atorvastatin (LIPITOR) 40 MG tablet Take 1 tablet (40 mg total) by mouth daily. 90 tablet 3   clopidogrel (PLAVIX) 75 MG tablet Take 1 tablet (75 mg total) by mouth daily. 90 tablet 3   ezetimibe (ZETIA) 10 MG tablet Take 1 tablet (10 mg total) by mouth daily. 90 tablet 3   fenofibrate (TRICOR) 145 MG tablet Take 1 tablet (145 mg total) by mouth daily. 90 tablet 1   finasteride (PROSCAR) 5 MG tablet TAKE 1 TABLET BY MOUTH EVERY DAY 90 tablet 3   FLUoxetine (PROZAC) 10 MG tablet Take 3 tablets (30 mg total) by mouth daily. 270 tablet 1   hydrochlorothiazide (HYDRODIURIL) 25 MG tablet Take 1 tablet (25 mg total) by mouth daily. 90 tablet 3   icosapent Ethyl (VASCEPA) 1 g capsule Take 2 capsules (2 g total) by mouth 2 (two) times daily. 360 capsule 2   isosorbide mononitrate (IMDUR) 60 MG 24 hr tablet Take 1.5 tablets (90 mg total) by mouth daily. 135 tablet 3   levocetirizine (XYZAL) 5 MG tablet Take 5 mg by mouth every evening.     lisinopril (ZESTRIL) 40 MG tablet Take 1 tablet (40 mg total) by mouth daily. 90 tablet 3   MYRBETRIQ 25 MG TB24 tablet TAKE 1 TABLET BY MOUTH EVERY DAY 30 tablet 3   nebivolol (BYSTOLIC) 10 MG tablet Take 1 tablet (10 mg total) by mouth daily. 90 tablet 3   nitroGLYCERIN (NITROSTAT) 0.4 MG SL tablet Place 1 tablet (0.4 mg total) under the tongue every 5 (five) minutes x 3 doses as needed for chest pain. 25 tablet 2   pantoprazole (PROTONIX) 40 MG tablet Take 1 tablet (40 mg total) by mouth daily. 90 tablet 1   potassium chloride (KLOR-CON M10) 10 MEQ tablet Take 1 tablet (10 mEq total) by mouth daily. Please keep upcoming appointment in April 2022 for future refills. Thank you 90 tablet 3   Semaglutide-Weight Management 2.4 MG/0.75ML SOAJ Inject 2.4 mg into the skin once a week. 3 mL 6   No current facility-administered medications on file prior to visit.    Allergies  Allergen Reactions   Atorvastatin Other (See Comments)     80 mg dose legs cramps, tolerates 40 mg dose   Claritin [Loratadine] Other (See Comments)    Makes allergies worse-"a lot worse"   Codeine Nausea And Vomiting   Vicodin [Hydrocodone-Acetaminophen] Nausea And Vomiting    "pretty much any codeine"     Assessment/Plan:  1. Weight loss - Patient has lost >5% of body weight. Will continue  Wegovy. Patient feels better with the decrease  in weight.  Patient instructed to continue with 2.4 mg dose of Wegovy.   2. HTN- Patient BP elevated in clinic today. Also high at home. Will stop KCL supplements and start spironolactone 12.5mg  daily. He will come for fasting lipids, A1C and BMP in 1 week. He will check his BP at home and I will call him with results on 12/13. Will make changes if needed at that time.  Follow up in 3 months.   Thanks,  Olene Floss, Pharm.D, BCPS, CPP [facility]  [address] [place], [place] [address]  Phone: [phone]; Fax: [phone]

## 2021-02-23 NOTE — Patient Instructions (Signed)
STOP taking Klor Con Start taking spironolactone 12.5mg  (1/2 tablet) daily Check fasting labs -lipids, A1C and BMP on 12/12  I will call you with results Keep checking blood pressure at home

## 2021-03-02 ENCOUNTER — Other Ambulatory Visit: Payer: BLUE CROSS/BLUE SHIELD | Admitting: *Deleted

## 2021-03-02 ENCOUNTER — Other Ambulatory Visit: Payer: Self-pay

## 2021-03-02 DIAGNOSIS — E669 Obesity, unspecified: Secondary | ICD-10-CM

## 2021-03-02 DIAGNOSIS — I10 Essential (primary) hypertension: Secondary | ICD-10-CM | POA: Diagnosis not present

## 2021-03-02 DIAGNOSIS — Z6837 Body mass index (BMI) 37.0-37.9, adult: Secondary | ICD-10-CM | POA: Diagnosis not present

## 2021-03-02 DIAGNOSIS — E785 Hyperlipidemia, unspecified: Secondary | ICD-10-CM

## 2021-03-02 LAB — BASIC METABOLIC PANEL
BUN/Creatinine Ratio: 17 (ref 9–20)
BUN: 17 mg/dL (ref 6–24)
CO2: 25 mmol/L (ref 20–29)
Calcium: 9.2 mg/dL (ref 8.7–10.2)
Chloride: 103 mmol/L (ref 96–106)
Creatinine, Ser: 0.98 mg/dL (ref 0.76–1.27)
Glucose: 103 mg/dL — ABNORMAL HIGH (ref 70–99)
Potassium: 3.7 mmol/L (ref 3.5–5.2)
Sodium: 140 mmol/L (ref 134–144)
eGFR: 90 mL/min/{1.73_m2} (ref >59)

## 2021-03-02 LAB — LIPID PANEL
Chol/HDL Ratio: 3.2 ratio (ref 0.0–5.0)
Cholesterol, Total: 123 mg/dL (ref 100–199)
HDL: 39 mg/dL — ABNORMAL LOW (ref >39)
LDL Chol Calc (NIH): 67 mg/dL (ref 0–99)
Triglycerides: 89 mg/dL (ref 0–149)
VLDL Cholesterol Cal: 17 mg/dL (ref 5–40)

## 2021-03-02 LAB — HEMOGLOBIN A1C
Est. average glucose Bld gHb Est-mCnc: 108 mg/dL
Hgb A1c MFr Bld: 5.4 % (ref 4.8–5.6)

## 2021-03-03 ENCOUNTER — Encounter: Payer: Self-pay | Admitting: Pharmacist

## 2021-03-05 ENCOUNTER — Other Ambulatory Visit: Payer: Self-pay | Admitting: Pharmacist

## 2021-03-05 MED ORDER — SPIRONOLACTONE 25 MG PO TABS: 25.0000 mg | ORAL_TABLET | Freq: Every day | ORAL | 3 refills | Status: DC

## 2021-04-13 ENCOUNTER — Other Ambulatory Visit: Payer: Self-pay

## 2021-04-13 ENCOUNTER — Ambulatory Visit (INDEPENDENT_AMBULATORY_CARE_PROVIDER_SITE_OTHER): Payer: BLUE CROSS/BLUE SHIELD

## 2021-04-13 ENCOUNTER — Other Ambulatory Visit: Payer: Self-pay | Admitting: *Deleted

## 2021-04-13 DIAGNOSIS — E782 Mixed hyperlipidemia: Secondary | ICD-10-CM

## 2021-04-13 DIAGNOSIS — Z23 Encounter for immunization: Secondary | ICD-10-CM

## 2021-04-13 DIAGNOSIS — I2510 Atherosclerotic heart disease of native coronary artery without angina pectoris: Secondary | ICD-10-CM

## 2021-04-13 DIAGNOSIS — I2583 Coronary atherosclerosis due to lipid rich plaque: Secondary | ICD-10-CM

## 2021-04-13 DIAGNOSIS — Z951 Presence of aortocoronary bypass graft: Secondary | ICD-10-CM

## 2021-04-13 DIAGNOSIS — E785 Hyperlipidemia, unspecified: Secondary | ICD-10-CM

## 2021-04-13 DIAGNOSIS — Z79899 Other long term (current) drug therapy: Secondary | ICD-10-CM

## 2021-04-13 MED ORDER — FENOFIBRATE 145 MG PO TABS: 145.0000 mg | ORAL_TABLET | Freq: Every day | ORAL | 1 refills | Status: DC

## 2021-04-16 ENCOUNTER — Ambulatory Visit: Payer: BLUE CROSS/BLUE SHIELD | Admitting: Cardiology

## 2021-04-21 ENCOUNTER — Other Ambulatory Visit: Payer: Self-pay | Admitting: Urology

## 2021-04-22 ENCOUNTER — Other Ambulatory Visit: Payer: Self-pay | Admitting: *Deleted

## 2021-04-22 MED ORDER — ISOSORBIDE MONONITRATE ER 60 MG PO TB24: 90.0000 mg | ORAL_TABLET | Freq: Every day | ORAL | 0 refills | Status: DC

## 2021-04-27 ENCOUNTER — Other Ambulatory Visit: Payer: Self-pay | Admitting: *Deleted

## 2021-04-27 DIAGNOSIS — I2583 Coronary atherosclerosis due to lipid rich plaque: Secondary | ICD-10-CM

## 2021-04-27 DIAGNOSIS — E785 Hyperlipidemia, unspecified: Secondary | ICD-10-CM

## 2021-04-27 DIAGNOSIS — Z951 Presence of aortocoronary bypass graft: Secondary | ICD-10-CM

## 2021-04-27 DIAGNOSIS — I2510 Atherosclerotic heart disease of native coronary artery without angina pectoris: Secondary | ICD-10-CM

## 2021-04-27 DIAGNOSIS — I10 Essential (primary) hypertension: Secondary | ICD-10-CM

## 2021-04-27 DIAGNOSIS — Z79899 Other long term (current) drug therapy: Secondary | ICD-10-CM

## 2021-04-27 MED ORDER — LISINOPRIL 40 MG PO TABS: 40.0000 mg | ORAL_TABLET | Freq: Every day | ORAL | 3 refills | Status: DC

## 2021-04-28 ENCOUNTER — Other Ambulatory Visit: Payer: Self-pay

## 2021-04-28 DIAGNOSIS — I10 Essential (primary) hypertension: Secondary | ICD-10-CM

## 2021-04-28 DIAGNOSIS — E785 Hyperlipidemia, unspecified: Secondary | ICD-10-CM

## 2021-04-28 DIAGNOSIS — I2583 Coronary atherosclerosis due to lipid rich plaque: Secondary | ICD-10-CM

## 2021-04-28 DIAGNOSIS — Z951 Presence of aortocoronary bypass graft: Secondary | ICD-10-CM

## 2021-04-28 DIAGNOSIS — I2510 Atherosclerotic heart disease of native coronary artery without angina pectoris: Secondary | ICD-10-CM

## 2021-04-28 DIAGNOSIS — Z79899 Other long term (current) drug therapy: Secondary | ICD-10-CM

## 2021-04-28 MED ORDER — CLOPIDOGREL BISULFATE 75 MG PO TABS: 75.0000 mg | ORAL_TABLET | Freq: Every day | ORAL | 0 refills | Status: DC

## 2021-05-17 NOTE — Progress Notes (Deleted)
Cardiology Office Note:    Date:  05/17/2021   ID:  [name], DOB 08-16-1965, MRN [id number]  PCP:  Georganna Skeans, MD   [facility] Providers Cardiologist:  Tobias Alexander, MD {   Referring MD: Jarrett Soho, PA-C    History of Present Illness:    [name] is a 56 y.o. male with a hx of CAD s/p CABG x 4 04/18/2015 by Dr. Tyrone Sage (LIMA to LAD, Left radial artery to OM, SVG to Intermediate, and SVG to PD), post operative atrial fibrillation managed with amiodarone, HTN, depression and anxiety who was previously followed by Dr. Delton See who now returns to clinic for follow-up.   Patient has history of CAD s/p CABG in 2017 as detailed above.  1 month after his CABG, he was readmitted 04/2015 for recurrent CP and hypertensive urgency. Enzyme elevation was c/w NSTEMI. Subsequently, he underwent repeat LHC which demonstrated occlusion of the SVG to the ramus intermedius. The native vessel had moderate ostial stenosis but with TIMI 3 flow without other obstructive disease. It is possible that with occlusion of the SVG there may have been some thrombus showered downstream. His remaining 3 grafts were patent. Medical management was recommended. He was continued on ASA, a statin, BB and ARB. Plavix and a long acting nitrate were added to his regimen. He was also prescribed PRN SL NTG. He did well with medical therapy.  Patient was last seen in clinic on 07/07/20 where he was having more dyspnea on exertion and intermittent exertional chest pain. Myoview 07/16/20 with no evidence of ischemia or infarction. EF 47%. Lung cancer screening CT with no nodules, underlying CAD and aortic sclerosis and COPD.  Last seen in clinic 09/2020 where he was doing well. Had lost 15lbs on ozempic.  Today, ***    Past Medical History:  Diagnosis Date   Anxiety    Depression    Hyperlipidemia    Hypertension    Lumbar herniated disc dx'd 03/2015   NSTEMI (non-ST elevated myocardial infarction)  (HCC) 04/15/2015    Past Surgical History:  Procedure Laterality Date   BACK SURGERY     CARDIAC CATHETERIZATION  04/15/2015   CARDIAC CATHETERIZATION [name] 04/15/2015   Procedure: Left Heart Cath and Coronary Angiography;  Surgeon: Runell Gess, MD;  Location: [facility] INVASIVE CV LAB;  Service: Cardiovascular;  Laterality: [name];   CARDIAC CATHETERIZATION [name] 05/20/2015   Procedure: Left Heart Cath and Cors/Grafts Angiography;  Surgeon: Peter M Swaziland, MD;  Location: [facility] INVASIVE CV LAB;  Service: Cardiovascular;  Laterality: [name];   CORONARY ARTERY BYPASS GRAFT [name] 04/18/2015   Procedure: CORONARY ARTERY BYPASS GRAFTING times four with LIMA  to LAD, LEFT RADIAL ARTERY to OM, Right saphenous vein harvesting and utilized to Interm and to PD ENDOSCOPIC GREATER SAPHENOUS VEIN HARVEST (EVH) RIGHT THIGH;  Surgeon: Delight Ovens, MD;  Location: [facility] OR;  Service: Open Heart Surgery;  Laterality: [name];   LUMBAR LAMINECTOMY  2002   RADIAL ARTERY HARVEST Left 04/18/2015   Procedure:  LEFT Arm RADIAL ARTERY HARVEST;  Surgeon: Delight Ovens, MD;  Location: [facility] OR;  Service: Open Heart Surgery;  Laterality: Left;   TEE WITHOUT CARDIOVERSION [name] 04/18/2015   Procedure: TRANSESOPHAGEAL ECHOCARDIOGRAM (TEE);  Surgeon: Delight Ovens, MD;  Location: [facility] OR;  Service: Open Heart Surgery;  Laterality: [name];   TONSILLECTOMY  1960s    Current Medications: No outpatient medications have been marked as taking for the 05/20/21 encounter (Appointment) with Meriam Sprague, MD.  Allergies:   Atorvastatin, Claritin [loratadine], Codeine, and Vicodin [hydrocodone-acetaminophen]   Social History   Socioeconomic History   Marital status: Married    Spouse name: Not on file   Number of children: Not on file   Years of education: Not on file   Highest education level: Not on file  Occupational History   Not on file  Tobacco Use   Smoking status: Some Days    Packs/day: 1.50    Years: 33.00    Pack years:  49.50    Types: Cigarettes    Last attempt to quit: 04/15/2015    Years since quitting: 6.0   Smokeless tobacco: Never  Vaping Use   Vaping Use: Never used  Substance and Sexual Activity   Alcohol use: Yes    Alcohol/week: 0.0 standard drinks    Comment: occasionally   Drug use: Not Currently    Types: Marijuana, Cocaine    Comment: "in my teens"   Sexual activity: Not on file  Other Topics Concern   Not on file  Social History Narrative   Not on file   Social Determinants of Health   Financial Resource Strain: Not on file  Food Insecurity: Not on file  Transportation Needs: Not on file  Physical Activity: Not on file  Stress: Not on file  Social Connections: Not on file     Family History: The patient's family history includes Cancer in his father; Diabetes in his sister; Heart attack (age of onset: 7551) in his father.  ROS:   Please see the history of present illness.    Review of Systems  Constitutional:  Negative for chills and fever.  HENT:  Negative for sore throat.   Eyes:  Negative for blurred vision.  Respiratory:  Positive for shortness of breath.   Cardiovascular:  Negative for chest pain, palpitations, orthopnea, claudication, leg swelling and PND.  Gastrointestinal:  Positive for nausea.  Genitourinary:  Negative for dysuria.  Musculoskeletal:  Negative for falls.  Neurological:  Negative for dizziness and loss of consciousness.  Psychiatric/Behavioral:  Positive for substance abuse.     EKGs/Labs/Other Studies Reviewed:    The following studies were reviewed today: Myoview 07/16/20: Nuclear stress EF: 47%. There was no ST segment deviation noted during stress. The study is normal. This is a low risk study.   Low risk stress nuclear study with normal perfusion and mildly reduced global systolic function. Consider echo correlation. TTE 07/18/19:  1. Inferior basal hypokinesis . Left ventricular ejection fraction, by  estimation, is 60%. The left  ventricle has normal function. The left  ventricle has no regional wall motion abnormalities. Left ventricular  diastolic parameters were normal.   2. Right ventricular systolic function is normal. The right ventricular  size is normal.   3. Left atrial size was moderately dilated.   4. Right atrial size was moderately dilated.   5. The mitral valve is normal in structure. No evidence of mitral valve  regurgitation. No evidence of mitral stenosis.   6. The aortic valve is normal in structure. Aortic valve regurgitation is  not visualized. No aortic stenosis is present.   7. The inferior vena cava is normal in size with greater than 50%  respiratory variability, suggesting right atrial pressure of 3 mmHg.  CT lung caner screen 07/16/20: IMPRESSION: 1. Lung-RADS 2, benign appearance or behavior. Continue annual screening with low-dose chest CT without contrast in 12 months. 2. There is aortic atherosclerosis, in addition to left main and 3  vessel coronary artery disease. Status post median sternotomy for CABG including LIMA to the LAD. 3. Mild diffuse bronchial wall thickening with very mild centrilobular and paraseptal emphysema; imaging findings suggestive of underlying COPD.   Aortic Atherosclerosis (ICD10-I70.0) and Emphysema (ICD10-J43.9).   TTE: 04/2015 - Left ventricle: The cavity size was normal. Wall thickness was   increased in a pattern of mild LVH. Basal inferior severe   hypokinesis, inferolateral hypokinesis. Systolic function was low   normal to mildly reduced. The estimated ejection fraction was in   the range of 50% to 55%. Doppler parameters are consistent with   abnormal left ventricular relaxation (grade 1 diastolic   dysfunction). - Aortic valve: There was no stenosis. - Ascending aorta: The ascending aorta was mildly dilated at 3.7   cm. - Mitral valve: There was no significant regurgitation. - Left atrium: The atrium was mildly dilated. - Right ventricle:  Poorly visualized. - Pulmonary arteries: No complete TR doppler jet so unable to   estimate PA systolic pressure. - Inferior vena cava: The vessel was normal in size. The   respirophasic diameter changes were in the normal range (>= 50%),   consistent with normal central venous pressure. - Pericardium, extracardiac: A trivial pericardial effusion was   identified.   Cath 2017: Mid Cx lesion, 95% stenosed. Mid Cx to Dist Cx lesion, 95% stenosed. Prox LAD to Mid LAD lesion, 50% stenosed. Ost Ramus lesion, 60% stenosed. Prox Cx lesion, 95% stenosed. Prox RCA-1 lesion, 75% stenosed. Prox RCA-2 lesion, 80% stenosed. Mid RCA lesion, 90% stenosed. Dist RCA lesion, 60% stenosed. RPDA lesion, 60% stenosed. SVG to the PDA was injected is very large, and is anatomically normal. There is competitive flow. SVG to the ramus intermediate was injected is very large. Mid Graft to Insertion lesion, 100% stenosed. Radial artery graft to the OM was injected is normal in caliber, and is anatomically normal. There is competitive flow. LIMA to the LAD was injected is normal in caliber, and is anatomically normal. The left ventricular systolic function is normal.   1. Severe 3 vessel obstructive CAD 2. Patent LIMA to the LAD 3. Patent free radial graft to the OM. This arises from the hood of the SVG to the ramus intermediate branch 4. Occluded mid SVG to the ramus intermediate with thrombus. 5. Patent SVG to the PDA 6. Good LV function.   Plan: It appears that the culprit lesion is occlusion of the SVG to the ramus intermediate. The native vessel does have moderate ostial stenosis but has TIMI 3 flow without other obstructive disease. It is possible that with occlusion of the SVG there may have been some thrombus showered downstream. At this point would treat medically.   EKG:  No new ECG today  Recent Labs: 03/02/2021: BUN 17; Creatinine, Ser 0.98; Potassium 3.7; Sodium 140  Recent Lipid Panel     Component Value Date/Time   CHOL 123 03/02/2021 0715   TRIG 89 03/02/2021 0715   HDL 39 (L) 03/02/2021 0715   CHOLHDL 3.2 03/02/2021 0715   CHOLHDL 3.8 05/20/2015 0514   VLDL 22 05/20/2015 0514   LDLCALC 67 03/02/2021 0715         Physical Exam:    VS:  There were no vitals taken for this visit.    Wt Readings from Last 3 Encounters:  02/23/21 217 lb (98.4 kg)  01/22/21 216 lb (98 kg)  12/01/20 233 lb 6.4 oz (105.9 kg)     GEN:  Well nourished, well  developed in no acute distress HEENT: Normal NECK: No JVD; No carotid bruits CARDIAC: RRR, no murmurs, rubs, gallops RESPIRATORY:  Clear to auscultation without rales, wheezing or rhonchi  ABDOMEN: Soft, non-tender, non-distended MUSCULOSKELETAL:  No edema; No deformity  SKIN: Warm and dry NEUROLOGIC:  Alert and oriented x 3 PSYCHIATRIC:  Normal affect   ASSESSMENT:    No diagnosis found.  PLAN:    In order of problems listed above:  #Multivessel CAD s/p CABG x4 in 2016: Patient with NSTEMI in 03/2014 treated with CABG x 4 as detailed above. He had a second NSTEMI 04/2015 with cath revealing a failed SVG to RI. His other 3 grafts were patent.  LVEF low normal in 2017 with mild regional wall motion abnormalities now with LVEF 60% on TTE 06/2019 with inferobasal hypokinesis. Recent myoview 06/2020 without evidence of ischemia or infarction. -Myoview 06/2020 negative for ischemia -Continue ASA 81mg  daily; plavix 75mg  daily -Continue lipitor 40mg  daily and zetia 10mg  daily -Continue lisinopril 40mg  daily -Continue bystolic 10mg  daily -Continue imdur 90mg  daily   #Post operative atrial fibrillation:  Occurred post CABG and managed with short term amio. No recurrence since his bypass surgery. -Continue to monitor--off AC   #HTN: Well controlled and at goal <120s/80s. -Continue lisinopril 40mg  daily -Continue bystolic 10mg  daily -Continue amlodipine 10mg  daily -Continue imdur 90mg  daily   #Mixed Hyperlipidemia: Last LDL  02/18/20 66.  TG 273 -Continue lipitor 40mg  daily and zetia 10mg  daily -Check lipid panel today   #Tobacco Abuse:  Continues to smoke about 1/2 pack per day -Encourage complete cessation -Encouraged nicotine patches -Needs yearly CT screening' last 06/2020 without nodules  #Mild Carotid Artery Disease: 1-39% bilaterally in 2017. -Repeat carotid ultrasiund for monitoring -Continue ASA, zetia and lipitor as above   #Obesity: BMI down to 35. Tolerating ozempic and lost 15lbs! -Continue ozempic -Continue healthy diet and weight loss efforts as detailed below   Exercise recommendations: Goal of exercising for at least 30 minutes a day, at least 5 times per week.  Please exercise to a moderate exertion.  This means that while exercising it is difficult to speak in full sentences, however you are not so short of breath that you feel you must stop, and not so comfortable that you can carry on a full conversation.  Exertion level should be approximately a 5/10, if 10 is the most exertion you can perform.   Diet recommendations: Recommend a heart healthy diet such as the Mediterranean diet.  This diet consists of plant based foods, healthy fats, lean meats, olive oil.  It suggests limiting the intake of simple carbohydrates such as white breads, pastries, and pastas.  It also limits the amount of red meat, wine, and dairy products such as cheese that one should consume on a daily basis.     Medication Adjustments/Labs and Tests Ordered: Current medicines are reviewed at length with the patient today.  Concerns regarding medicines are outlined above.  No orders of the defined types were placed in this encounter.  No orders of the defined types were placed in this encounter.   There are no Patient Instructions on file for this visit.    Signed, , MD  05/17/2021 7:58 PM    [facility]

## 2021-05-20 ENCOUNTER — Ambulatory Visit: Payer: BLUE CROSS/BLUE SHIELD | Admitting: Cardiology

## 2021-05-20 ENCOUNTER — Other Ambulatory Visit: Payer: Self-pay

## 2021-05-20 ENCOUNTER — Encounter: Payer: Self-pay | Admitting: Cardiology

## 2021-05-20 VITALS — BP 120/92 | HR 73 | Ht 71.0 in | Wt 202.2 lb

## 2021-05-20 DIAGNOSIS — Z720 Tobacco use: Secondary | ICD-10-CM

## 2021-05-20 DIAGNOSIS — I10 Essential (primary) hypertension: Secondary | ICD-10-CM

## 2021-05-20 DIAGNOSIS — E669 Obesity, unspecified: Secondary | ICD-10-CM

## 2021-05-20 DIAGNOSIS — E782 Mixed hyperlipidemia: Secondary | ICD-10-CM

## 2021-05-20 DIAGNOSIS — I2510 Atherosclerotic heart disease of native coronary artery without angina pectoris: Secondary | ICD-10-CM | POA: Diagnosis not present

## 2021-05-20 DIAGNOSIS — I779 Disorder of arteries and arterioles, unspecified: Secondary | ICD-10-CM

## 2021-05-20 DIAGNOSIS — Z951 Presence of aortocoronary bypass graft: Secondary | ICD-10-CM | POA: Diagnosis not present

## 2021-05-20 DIAGNOSIS — E785 Hyperlipidemia, unspecified: Secondary | ICD-10-CM | POA: Diagnosis not present

## 2021-05-20 DIAGNOSIS — Z79899 Other long term (current) drug therapy: Secondary | ICD-10-CM

## 2021-05-20 DIAGNOSIS — Z6837 Body mass index (BMI) 37.0-37.9, adult: Secondary | ICD-10-CM

## 2021-05-20 LAB — LIPID PANEL
Chol/HDL Ratio: 2.7 ratio (ref 0.0–5.0)
Cholesterol, Total: 115 mg/dL (ref 100–199)
HDL: 43 mg/dL (ref >39)
LDL Chol Calc (NIH): 58 mg/dL (ref 0–99)
Triglycerides: 67 mg/dL (ref 0–149)
VLDL Cholesterol Cal: 14 mg/dL (ref 5–40)

## 2021-05-20 NOTE — Progress Notes (Signed)
Cardiology Office Note:    Date:  05/20/2021   ID:  [name], DOB 1965/12/14, MRN [id number]  PCP:  Georganna Skeans, MD   [facility] Providers Cardiologist:  Tobias Alexander, MD {   Referring MD: Jarrett Soho, PA-C    History of Present Illness:    [name] is a 56 y.o. male with a hx of CAD s/p CABG x 4 04/18/2015 by Dr. Tyrone Sage (LIMA to LAD, Left radial artery to OM, SVG to Intermediate, and SVG to PD), post operative atrial fibrillation managed with amiodarone, HTN, depression and anxiety who was previously followed by Dr. Delton See who now returns to clinic for follow-up.   Patient has history of CAD s/p CABG in 2017 as detailed above.  1 month after his CABG, he was readmitted 04/2015 for recurrent CP and hypertensive urgency. Enzyme elevation was c/w NSTEMI. Subsequently, he underwent repeat LHC which demonstrated occlusion of the SVG to the ramus intermedius. The native vessel had moderate ostial stenosis but with TIMI 3 flow without other obstructive disease. It is possible that with occlusion of the SVG there may have been some thrombus showered downstream. His remaining 3 grafts were patent. Medical management was recommended. He was continued on ASA, a statin, BB and ARB. Plavix and a long acting nitrate were added to his regimen. He was also prescribed PRN SL NTG. He did well with medical therapy.  Patient was last seen in clinic on 07/07/20 where he was having more dyspnea on exertion and intermittent exertional chest pain. Myoview 07/16/20 with no evidence of ischemia or infarction. EF 47%. Lung cancer screening CT with no nodules, underlying CAD and aortic sclerosis and COPD.  Last seen in clinic 09/2020 where he was doing well. Had lost 15lbs on ozempic.  Today, he is doing well. He has lost weight. He no longer feels short of breath and has more energy. However, he continues to smoke. At home, he reports him blood pressure runs normal. He stays active by  walking around work. He is compliant with his medications.  Occasionally, he becomes lightheaded. The lightheadedness is worse with positional changes. States that this mainly happens when he is dehydrated. No syncope.   The patient denies chest pain, chest pressure, dyspnea at rest or with exertion, palpitations, PND, orthopnea, or leg swelling. Denies cough, fever, chills. Denies nausea, vomiting. Denies syncope or presyncope. Denies snoring.  Past Medical History:  Diagnosis Date   Anxiety    Depression    Hyperlipidemia    Hypertension    Lumbar herniated disc dx'd 03/2015   NSTEMI (non-ST elevated myocardial infarction) (HCC) 04/15/2015    Past Surgical History:  Procedure Laterality Date   BACK SURGERY     CARDIAC CATHETERIZATION  04/15/2015   CARDIAC CATHETERIZATION [name] 04/15/2015   Procedure: Left Heart Cath and Coronary Angiography;  Surgeon: Runell Gess, MD;  Location: [facility] INVASIVE CV LAB;  Service: Cardiovascular;  Laterality: [name];   CARDIAC CATHETERIZATION [name] 05/20/2015   Procedure: Left Heart Cath and Cors/Grafts Angiography;  Surgeon: Peter M Swaziland, MD;  Location: [facility] INVASIVE CV LAB;  Service: Cardiovascular;  Laterality: [name];   CORONARY ARTERY BYPASS GRAFT [name] 04/18/2015   Procedure: CORONARY ARTERY BYPASS GRAFTING times four with LIMA  to LAD, LEFT RADIAL ARTERY to OM, Right saphenous vein harvesting and utilized to Interm and to PD ENDOSCOPIC GREATER SAPHENOUS VEIN HARVEST (EVH) RIGHT THIGH;  Surgeon: Delight Ovens, MD;  Location: [facility] OR;  Service: Open Heart Surgery;  Laterality: [name];  LUMBAR LAMINECTOMY  2002   RADIAL ARTERY HARVEST Left 04/18/2015   Procedure:  LEFT Arm RADIAL ARTERY HARVEST;  Surgeon: Delight Ovens, MD;  Location: [facility] OR;  Service: Open Heart Surgery;  Laterality: Left;   TEE WITHOUT CARDIOVERSION [name] 04/18/2015   Procedure: TRANSESOPHAGEAL ECHOCARDIOGRAM (TEE);  Surgeon: Delight Ovens, MD;  Location: [facility] OR;  Service: Open Heart Surgery;   Laterality: [name];   TONSILLECTOMY  1960s    Current Medications: Current Meds  Medication Sig   alfuzosin (UROXATRAL) 10 MG 24 hr tablet TAKE 1 TABLET BY MOUTH EVERYDAY AT BEDTIME   amLODipine (NORVASC) 10 MG tablet Take 1 tablet (10 mg total) by mouth daily.   aspirin EC 81 MG EC tablet Take 1 tablet (81 mg total) by mouth daily.   atorvastatin (LIPITOR) 40 MG tablet Take 1 tablet (40 mg total) by mouth daily.   clopidogrel (PLAVIX) 75 MG tablet Take 1 tablet (75 mg total) by mouth daily.   ezetimibe (ZETIA) 10 MG tablet Take 1 tablet (10 mg total) by mouth daily.   fenofibrate (TRICOR) 145 MG tablet Take 1 tablet (145 mg total) by mouth daily.   finasteride (PROSCAR) 5 MG tablet TAKE 1 TABLET BY MOUTH EVERY DAY   FLUoxetine (PROZAC) 10 MG tablet Take 3 tablets (30 mg total) by mouth daily.   hydrochlorothiazide (HYDRODIURIL) 25 MG tablet Take 1 tablet (25 mg total) by mouth daily.   icosapent Ethyl (VASCEPA) 1 g capsule Take 2 capsules (2 g total) by mouth 2 (two) times daily.   isosorbide mononitrate (IMDUR) 60 MG 24 hr tablet Take 1.5 tablets (90 mg total) by mouth daily.   KLOR-CON M10 10 MEQ tablet Take 10 mEq by mouth daily.   levocetirizine (XYZAL) 5 MG tablet Take 5 mg by mouth every evening.   lisinopril (ZESTRIL) 40 MG tablet Take 1 tablet (40 mg total) by mouth daily.   MYRBETRIQ 25 MG TB24 tablet TAKE 1 TABLET BY MOUTH EVERY DAY   nebivolol (BYSTOLIC) 10 MG tablet Take 1 tablet (10 mg total) by mouth daily.   nitroGLYCERIN (NITROSTAT) 0.4 MG SL tablet Place 1 tablet (0.4 mg total) under the tongue every 5 (five) minutes x 3 doses as needed for chest pain.   pantoprazole (PROTONIX) 40 MG tablet Take 1 tablet (40 mg total) by mouth daily.   Semaglutide-Weight Management 2.4 MG/0.75ML SOAJ Inject 2.4 mg into the skin once a week.   silodosin (RAPAFLO) 8 MG CAPS capsule Take 8 mg by mouth daily.   spironolactone (ALDACTONE) 25 MG tablet Take 1 tablet (25 mg total) by mouth daily.      Allergies:   Atorvastatin, Claritin [loratadine], Codeine, and Vicodin [hydrocodone-acetaminophen]   Social History   Socioeconomic History   Marital status: Married    Spouse name: Not on file   Number of children: Not on file   Years of education: Not on file   Highest education level: Not on file  Occupational History   Not on file  Tobacco Use   Smoking status: Some Days    Packs/day: 1.50    Years: 33.00    Pack years: 49.50    Types: Cigarettes    Last attempt to quit: 04/15/2015    Years since quitting: 6.1   Smokeless tobacco: Never  Vaping Use   Vaping Use: Never used  Substance and Sexual Activity   Alcohol use: Yes    Alcohol/week: 0.0 standard drinks    Comment: occasionally   Drug  use: Not Currently    Types: Marijuana, Cocaine    Comment: "in my teens"   Sexual activity: Not on file  Other Topics Concern   Not on file  Social History Narrative   Not on file   Social Determinants of Health   Financial Resource Strain: Not on file  Food Insecurity: Not on file  Transportation Needs: Not on file  Physical Activity: Not on file  Stress: Not on file  Social Connections: Not on file     Family History: The patient's family history includes Cancer in his father; Diabetes in his sister; Heart attack (age of onset: 71) in his father.  ROS:   Please see the history of present illness.    Review of Systems  Constitutional:  Negative for chills and fever.  HENT:  Negative for sore throat.   Eyes:  Negative for blurred vision.  Cardiovascular:  Negative for chest pain, palpitations, orthopnea, claudication, leg swelling and PND.  Gastrointestinal:  Negative for nausea.  Genitourinary:  Negative for dysuria.  Musculoskeletal:  Negative for falls.  Skin:  Negative for itching and rash.  Neurological:  Positive for dizziness. Negative for loss of consciousness.  Endo/Heme/Allergies:  Does not bruise/bleed easily.  Psychiatric/Behavioral:  Negative for  substance abuse.     EKGs/Labs/Other Studies Reviewed:    The following studies were reviewed today: Carotid Arterial Duplex 10/22/20 Right Carotid: Velocities in the right ICA are consistent with a 1-39%  stenosis. Decrease ICA velocities compared to prior exam.   Left Carotid: Velocities in the left ICA are consistent with a 1-39%  stenosis. Decrease ICA velocities compared to prior exam.   Vertebrals:  Left vertebral artery demonstrates antegrade flow. Small  caliber right vertebral artery with atypical antegrade flow.   Subclavians: Normal flow hemodynamics were seen in bilateral subclavian arteries.   Myoview 07/16/20: Nuclear stress EF: 47%. There was no ST segment deviation noted during stress. The study is normal. This is a low risk study.   Low risk stress nuclear study with normal perfusion and mildly reduced global systolic function. Consider echo correlation.  TTE 07/18/19:  1. Inferior basal hypokinesis . Left ventricular ejection fraction, by  estimation, is 60%. The left ventricle has normal function. The left  ventricle has no regional wall motion abnormalities. Left ventricular  diastolic parameters were normal.   2. Right ventricular systolic function is normal. The right ventricular  size is normal.   3. Left atrial size was moderately dilated.   4. Right atrial size was moderately dilated.   5. The mitral valve is normal in structure. No evidence of mitral valve  regurgitation. No evidence of mitral stenosis.   6. The aortic valve is normal in structure. Aortic valve regurgitation is  not visualized. No aortic stenosis is present.   7. The inferior vena cava is normal in size with greater than 50%  respiratory variability, suggesting right atrial pressure of 3 mmHg.  CT lung caner screen 07/16/20: IMPRESSION: 1. Lung-RADS 2, benign appearance or behavior. Continue annual screening with low-dose chest CT without contrast in 12 months. 2. There is aortic  atherosclerosis, in addition to left main and 3 vessel coronary artery disease. Status post median sternotomy for CABG including LIMA to the LAD. 3. Mild diffuse bronchial wall thickening with very mild centrilobular and paraseptal emphysema; imaging findings suggestive of underlying COPD.   Aortic Atherosclerosis (ICD10-I70.0) and Emphysema (ICD10-J43.9).   TTE: 04/2015 - Left ventricle: The cavity size was normal. Wall thickness was  increased in a pattern of mild LVH. Basal inferior severe   hypokinesis, inferolateral hypokinesis. Systolic function was low   normal to mildly reduced. The estimated ejection fraction was in   the range of 50% to 55%. Doppler parameters are consistent with   abnormal left ventricular relaxation (grade 1 diastolic   dysfunction). - Aortic valve: There was no stenosis. - Ascending aorta: The ascending aorta was mildly dilated at 3.7   cm. - Mitral valve: There was no significant regurgitation. - Left atrium: The atrium was mildly dilated. - Right ventricle: Poorly visualized. - Pulmonary arteries: No complete TR doppler jet so unable to   estimate PA systolic pressure. - Inferior vena cava: The vessel was normal in size. The   respirophasic diameter changes were in the normal range (>= 50%),   consistent with normal central venous pressure. - Pericardium, extracardiac: A trivial pericardial effusion was   identified.   Cath 2017: Mid Cx lesion, 95% stenosed. Mid Cx to Dist Cx lesion, 95% stenosed. Prox LAD to Mid LAD lesion, 50% stenosed. Ost Ramus lesion, 60% stenosed. Prox Cx lesion, 95% stenosed. Prox RCA-1 lesion, 75% stenosed. Prox RCA-2 lesion, 80% stenosed. Mid RCA lesion, 90% stenosed. Dist RCA lesion, 60% stenosed. RPDA lesion, 60% stenosed. SVG to the PDA was injected is very large, and is anatomically normal. There is competitive flow. SVG to the ramus intermediate was injected is very large. Mid Graft to Insertion lesion, 100%  stenosed. Radial artery graft to the OM was injected is normal in caliber, and is anatomically normal. There is competitive flow. LIMA to the LAD was injected is normal in caliber, and is anatomically normal. The left ventricular systolic function is normal.   1. Severe 3 vessel obstructive CAD 2. Patent LIMA to the LAD 3. Patent free radial graft to the OM. This arises from the hood of the SVG to the ramus intermediate branch 4. Occluded mid SVG to the ramus intermediate with thrombus. 5. Patent SVG to the PDA 6. Good LV function.   Plan: It appears that the culprit lesion is occlusion of the SVG to the ramus intermediate. The native vessel does have moderate ostial stenosis but has TIMI 3 flow without other obstructive disease. It is possible that with occlusion of the SVG there may have been some thrombus showered downstream. At this point would treat medically.   EKG:  EKG was not ordered today  Recent Labs: 03/02/2021: BUN 17; Creatinine, Ser 0.98; Potassium 3.7; Sodium 140  Recent Lipid Panel    Component Value Date/Time   CHOL 123 03/02/2021 0715   TRIG 89 03/02/2021 0715   HDL 39 (L) 03/02/2021 0715   CHOLHDL 3.2 03/02/2021 0715   CHOLHDL 3.8 05/20/2015 0514   VLDL 22 05/20/2015 0514   LDLCALC 67 03/02/2021 0715         Physical Exam:    VS:  BP (!) 120/92    Pulse 73    Ht 5\' 11"  (1.803 m)    Wt 202 lb 3.2 oz (91.7 kg)    SpO2 99%    BMI 28.20 kg/m     Wt Readings from Last 3 Encounters:  05/20/21 202 lb 3.2 oz (91.7 kg)  02/23/21 217 lb (98.4 kg)  01/22/21 216 lb (98 kg)     GEN:  Well nourished, well developed in no acute distress HEENT: Normal NECK: No JVD; No carotid bruits CARDIAC: RRR, 1/6 murmurs no rubs or gallops RESPIRATORY:  Clear to auscultation without rales, wheezing  or rhonchi  ABDOMEN: Soft, non-tender, non-distended MUSCULOSKELETAL:  No edema; No deformity  SKIN: Warm and dry NEUROLOGIC:  Alert and oriented x 3 PSYCHIATRIC:  Normal affect    ASSESSMENT:    1. Coronary artery disease involving native coronary artery of native heart without angina pectoris   2. Hyperlipidemia, unspecified hyperlipidemia type   3. S/P CABG x 4   4. Medication management   5. Tobacco abuse   6. Bilateral carotid artery disease, unspecified type (HCC)   7. Essential (primary) hypertension   8. Elevated triglycerides with high cholesterol   9. Class 2 obesity with body mass index (BMI) of 37.0 to 37.9 in adult, unspecified obesity type, unspecified whether serious comorbidity present     PLAN:    In order of problems listed above:  #Multivessel CAD s/p CABG x4 in 2016: Patient with NSTEMI in 03/2014 treated with CABG x 4 as detailed above. He had a second NSTEMI 04/2015 with cath revealing a failed SVG to RI. His other 3 grafts were patent.  LVEF low normal in 2017 with mild regional wall motion abnormalities now with LVEF 60% on TTE 06/2019 with inferobasal hypokinesis. Recent myoview 06/2020 without evidence of ischemia or infarction. Currently doing well with no anginal symptoms. -Myoview 06/2020 negative for ischemia -Continue ASA 81mg  daily; plavix 75mg  daily -Continue lipitor 40mg  daily and zetia 10mg  daily -Continue lisinopril 40mg  daily -Continue bystolic 10mg  daily -Continue imdur 90mg  daily   #Post operative atrial fibrillation:  Occurred post CABG and managed with short term amio. No recurrence since his bypass surgery. -Continue to monitor--off AC   #HTN: Well controlled and at goal <120s/80s. -Continue lisinopril 40mg  daily -Continue bystolic 10mg  daily -Continue amlodipine 10mg  daily -Continue imdur 90mg  daily   #Mixed Hyperlipidemia: LDL 67. Ideally goal is <55 given significant CAD. -Continue lipitor 40mg  daily and zetia 10mg  daily -Check lipid panel today   #Tobacco Abuse:  Continues to smoke about 1/2 pack per day. -Encourage complete cessation -Encouraged nicotine patches -Needs yearly CT screening' last 06/2020  without nodules  #Mild Carotid Artery Disease: 1-39% bilaterally in 2022. -Repeat carotid ultrasiund in 2025 -Continue ASA, zetia and lipitor as above   #Obesity: BMI down to 28! Tolerating wegovy. -Continue wegovy -Continue healthy diet and weight loss efforts as detailed below   Exercise recommendations: Goal of exercising for at least 30 minutes a day, at least 5 times per week.  Please exercise to a moderate exertion.  This means that while exercising it is difficult to speak in full sentences, however you are not so short of breath that you feel you must stop, and not so comfortable that you can carry on a full conversation.  Exertion level should be approximately a 5/10, if 10 is the most exertion you can perform.   Diet recommendations: Recommend a heart healthy diet such as the Mediterranean diet.  This diet consists of plant based foods, healthy fats, lean meats, olive oil.  It suggests limiting the intake of simple carbohydrates such as white breads, pastries, and pastas.  It also limits the amount of red meat, wine, and dairy products such as cheese that one should consume on a daily basis.     Medication Adjustments/Labs and Tests Ordered: Current medicines are reviewed at length with the patient today.  Concerns regarding medicines are outlined above.  Orders Placed This Encounter  Procedures   Lipid Profile   No orders of the defined types were placed in this encounter.    Patient  Instructions  Medication Instructions:   Your physician recommends that you continue on your current medications as directed. Please refer to the Current Medication list given to you today.  *If you need a refill on your cardiac medications before your next appointment, please call your pharmacy*   Lab Work:  TODAY--LIPIDS  If you have labs (blood work) drawn today and your tests are completely normal, you will receive your results only by: MyChart Message (if you have MyChart) OR A  paper copy in the mail If you have any lab test that is abnormal or we need to change your treatment, we will call you to review the results.   Follow-Up: At [facility], you and your health needs are our priority.  As part of our continuing mission to provide you with exceptional heart care, we have created designated Provider Care Teams.  These Care Teams include your primary Cardiologist (physician) and Advanced Practice Providers (APPs -  Physician Assistants and Nurse Practitioners) who all work together to provide you with the care you need, when you need it.  We recommend signing up for the patient portal called "MyChart".  Sign up information is provided on this After Visit Summary.  MyChart is used to connect with patients for Virtual Visits (Telemedicine).  Patients are able to view lab/test results, encounter notes, upcoming appointments, etc.  Non-urgent messages can be sent to your provider as well.   To learn more about what you can do with MyChart, go to [email or web address].    Your next appointment:   6 month(s)  The format for your next appointment:   In Person  Provider:   DR. Ginette PitmanPEMBERTON   I,Mykaella Javier,acting as a scribe for Meriam SpragueHeather E Viana Sleep, MD.,have documented all relevant documentation on the behalf of Meriam SpragueHeather E Cambren Helm, MD,as directed by  Meriam SpragueHeather E Jceon Alverio, MD while in the presence of Meriam SpragueHeather E Mackenzie Groom, MD.  I, Meriam SpragueHeather E Colinda Barth, MD, have reviewed all documentation for this visit. The documentation on 05/20/21 for the exam, diagnosis, procedures, and orders are all accurate and complete.   Signed, Meriam SpragueHeather E Montine Hight, MD  05/20/2021 9:25 AM    [facility]

## 2021-05-20 NOTE — Patient Instructions (Signed)
Medication Instructions:  ? ?Your physician recommends that you continue on your current medications as directed. Please refer to the Current Medication list given to you today. ? ?*If you need a refill on your cardiac medications before your next appointment, please call your pharmacy* ? ? ?Lab Work: ? ?TODAY--LIPIDS ? ?If you have labs (blood work) drawn today and your tests are completely normal, you will receive your results only by: ?MyChart Message (if you have MyChart) OR ?A paper copy in the mail ?If you have any lab test that is abnormal or we need to change your treatment, we will call you to review the results. ? ? ?Follow-Up: ?At [facility], you and your health needs are our priority.  As part of our continuing mission to provide you with exceptional heart care, we have created designated Provider Care Teams.  These Care Teams include your primary Cardiologist (physician) and Advanced Practice Providers (APPs -  Physician Assistants and Nurse Practitioners) who all work together to provide you with the care you need, when you need it. ? ?We recommend signing up for the patient portal called "MyChart".  Sign up information is provided on this After Visit Summary.  MyChart is used to connect with patients for Virtual Visits (Telemedicine).  Patients are able to view lab/test results, encounter notes, upcoming appointments, etc.  Non-urgent messages can be sent to your provider as well.   ?To learn more about what you can do with MyChart, go to [email or web address].   ? ?Your next appointment:   ?6 month(s) ? ?The format for your next appointment:   ?In Person ? ?Provider:   ?DR. PEMBERTON ? ?

## 2021-05-21 ENCOUNTER — Encounter: Payer: Self-pay | Admitting: Pharmacist

## 2021-05-21 ENCOUNTER — Encounter: Payer: Self-pay | Admitting: Cardiology

## 2021-05-22 ENCOUNTER — Telehealth: Payer: Self-pay

## 2021-05-22 DIAGNOSIS — R3912 Poor urinary stream: Secondary | ICD-10-CM | POA: Diagnosis not present

## 2021-05-22 DIAGNOSIS — N401 Enlarged prostate with lower urinary tract symptoms: Secondary | ICD-10-CM | POA: Diagnosis not present

## 2021-05-22 MED ORDER — METOPROLOL SUCCINATE ER 50 MG PO TB24: 50.0000 mg | ORAL_TABLET | Freq: Every day | ORAL | 3 refills | Status: DC

## 2021-05-22 NOTE — Telephone Encounter (Signed)
**Note De-Identified [name] Obfuscation** Per the pts [place] I called him to discuss medications to replace Nebivolol due to cost. ?The pt states that his ins plan advised him that they do not cover Nebivolol unless he tries and fails: ?Metoprolol ?Atenolol  ?And ?Bisoprolol ?He has never taken any of these meds. ? ?He wants to know if he can take one of these medications as they will only cost him $15/90 day supply at [facility] which is [facility] preferred [facility] (does not want to switch to [facility] to use GoodRx card for a 90 day supply of Nebivolol for $35-$40). ? ?He is aware that we will contact him with Dr Jacolyn Reedy recommendations when available. ?

## 2021-05-22 NOTE — Telephone Encounter (Signed)
Pt aware that per Dr. Shari Prows, we will switch him from Bystolic to Toprol XL 50 mg po daily, due to insurance non-coverage. ?Confirmed the pharmacy of choice with the pt.  ?Pt verbalized understanding and agrees with this plan. ?

## 2021-05-22 NOTE — Telephone Encounter (Signed)
Absolutely! We can switch him from bystolic 10mg  to metoprolol succinate 50mg  XL daily.  ?

## 2021-05-29 DIAGNOSIS — N401 Enlarged prostate with lower urinary tract symptoms: Secondary | ICD-10-CM | POA: Diagnosis not present

## 2021-05-29 DIAGNOSIS — R972 Elevated prostate specific antigen [PSA]: Secondary | ICD-10-CM | POA: Diagnosis not present

## 2021-05-29 DIAGNOSIS — R351 Nocturia: Secondary | ICD-10-CM | POA: Diagnosis not present

## 2021-06-18 ENCOUNTER — Other Ambulatory Visit: Payer: Self-pay | Admitting: Pharmacist

## 2021-06-18 MED ORDER — SEMAGLUTIDE-WEIGHT MANAGEMENT 2.4 MG/0.75ML ~~LOC~~ SOAJ: 2.4000 mg | SUBCUTANEOUS | 6 refills | Status: DC

## 2021-06-26 ENCOUNTER — Telehealth: Payer: Self-pay

## 2021-06-26 NOTE — Telephone Encounter (Signed)
**Note De-Identified Gina Leblond Obfuscation** Icosapent PA started through covermymeds. ?Key: [id number] ?

## 2021-06-30 NOTE — Telephone Encounter (Signed)
**Note De-Identified Tajuan Dufault Obfuscation** Arlys John Chaudoin Key: [id number] - PA Case ID: [id number] ?Outcome: Approved on April 7 ?ICOSAPENT CAP 1GM is approved through 06/27/2022.  ?Drug: Icosapent Ethyl 1GM capsules ?Form ?[employer] Electronic Prior Authorization Form [phone] NCPDP) ? ?[id number] - [place], [place] - [address] AT [address] [facility] [facility] [address] (Ph: [phone]) is aware of this approval. ?

## 2021-07-03 ENCOUNTER — Other Ambulatory Visit: Payer: Self-pay

## 2021-07-03 MED ORDER — ISOSORBIDE MONONITRATE ER 60 MG PO TB24: 90.0000 mg | ORAL_TABLET | Freq: Every day | ORAL | 3 refills | Status: DC

## 2021-07-09 DIAGNOSIS — H25813 Combined forms of age-related cataract, bilateral: Secondary | ICD-10-CM | POA: Diagnosis not present

## 2021-07-09 DIAGNOSIS — H524 Presbyopia: Secondary | ICD-10-CM | POA: Diagnosis not present

## 2021-07-13 ENCOUNTER — Other Ambulatory Visit: Payer: Self-pay | Admitting: *Deleted

## 2021-07-13 DIAGNOSIS — Z79899 Other long term (current) drug therapy: Secondary | ICD-10-CM

## 2021-07-13 DIAGNOSIS — E785 Hyperlipidemia, unspecified: Secondary | ICD-10-CM

## 2021-07-13 DIAGNOSIS — Z951 Presence of aortocoronary bypass graft: Secondary | ICD-10-CM

## 2021-07-13 DIAGNOSIS — I10 Essential (primary) hypertension: Secondary | ICD-10-CM

## 2021-07-13 DIAGNOSIS — I2510 Atherosclerotic heart disease of native coronary artery without angina pectoris: Secondary | ICD-10-CM

## 2021-07-13 MED ORDER — AMLODIPINE BESYLATE 10 MG PO TABS: 10.0000 mg | ORAL_TABLET | Freq: Every day | ORAL | 3 refills | Status: DC

## 2021-07-14 ENCOUNTER — Other Ambulatory Visit: Payer: Self-pay | Admitting: Family Medicine

## 2021-07-14 DIAGNOSIS — F419 Anxiety disorder, unspecified: Secondary | ICD-10-CM

## 2021-07-16 ENCOUNTER — Ambulatory Visit (INDEPENDENT_AMBULATORY_CARE_PROVIDER_SITE_OTHER)
Admission: RE | Admit: 2021-07-16 | Discharge: 2021-07-16 | Disposition: A | Discharge status: Home / Self Care | Payer: BLUE CROSS/BLUE SHIELD | Source: Ambulatory Visit | Attending: Cardiology | Admitting: Cardiology

## 2021-07-16 ENCOUNTER — Other Ambulatory Visit: Payer: Self-pay | Admitting: Cardiology

## 2021-07-16 ENCOUNTER — Telehealth: Payer: Self-pay | Admitting: Pharmacist

## 2021-07-16 DIAGNOSIS — Z720 Tobacco use: Secondary | ICD-10-CM

## 2021-07-16 DIAGNOSIS — F17210 Nicotine dependence, cigarettes, uncomplicated: Secondary | ICD-10-CM | POA: Diagnosis not present

## 2021-07-16 DIAGNOSIS — Z87891 Personal history of nicotine dependence: Secondary | ICD-10-CM | POA: Diagnosis not present

## 2021-07-16 NOTE — Telephone Encounter (Signed)
Pt following up on refill request for ?FLUoxetine (PROZAC) 10 MG tablet ? ? ?[id number] - [place], [place] - [address] [facility] [address] ?

## 2021-07-16 NOTE — Telephone Encounter (Signed)
PA renewal sent to insurance. ?

## 2021-07-17 ENCOUNTER — Telehealth: Payer: Self-pay | Admitting: *Deleted

## 2021-07-17 DIAGNOSIS — Z720 Tobacco use: Secondary | ICD-10-CM

## 2021-07-17 NOTE — Telephone Encounter (Signed)
The patient has been notified of the result and verbalized understanding.  All questions (if any) were answered. Nuala Alpha, LPN [id number] AM  ? ?Pt aware he will need a repeat CT in one year for surveillance.  ?Pt aware I will go ahead and place the order in the system and send our CT Scheduler a message to call him back and arrange this appt for one year out.  ?Pt verbalized understanding and agrees with this plan. ?

## 2021-07-17 NOTE — Telephone Encounter (Signed)
-----   Message from Nuala Alpha, LPN sent at [id number]  7:37 AM EDT ----- ? ?----- Message ----- ?From: Freada Bergeron, MD ?Sent: 07/16/2021   8:57 PM EDT ?To: [facility] Triage ? ?His CT scan looks good with no nodules. Will continue with yearly CT scans.  ? ?

## 2021-07-17 NOTE — Addendum Note (Signed)
Addended by: Loa Socks on: 07/17/2021 09:19 AM ? ? Modules accepted: Orders ? ?

## 2021-07-20 ENCOUNTER — Telehealth: Payer: Self-pay | Admitting: *Deleted

## 2021-07-20 DIAGNOSIS — Z720 Tobacco use: Secondary | ICD-10-CM

## 2021-07-20 NOTE — Telephone Encounter (Signed)
-----   Message from Fabiola Backer, [place] sent at 07/17/2021  9:14 AM EDT ----- ?Regarding: RE: needs repeat CT CHEST LUNG CA SCREENING DONE IN ONE YEAR ?Can you extend this a few more days, like til the 30th? We will have to schedule for 07/19/2022.... ? ? ?----- Message ----- ?From: Loa Socks, LPN ?Sent: 07/17/2021   8:21 AM EDT ?To: Taryn F Rigney, RT, [facility] ?Subject: needs repeat CT CHEST LUNG CA SCREENING DONE# ? ?Dr. Shari Prows wants this pt to have a repeat CT Chest Lung Ca screening done in one year for surveillance.   ?Order is in and the pt is aware you will call to schedule. ?Can you please schedule and shoot me the date? ? ?Thanks ?Viktoriya Glaspy  ? ? ?

## 2021-07-20 NOTE — Telephone Encounter (Signed)
New order for CT Chest for Lung Ca Screening entered in the system with appropriate dates, as requested by CT Dept.  ?CT Dept to call pt and arrange this test accordingly, to be done in one year.  ?

## 2021-07-23 NOTE — Telephone Encounter (Signed)
[place] approved through 01/15/22 ?

## 2021-07-24 ENCOUNTER — Ambulatory Visit: Payer: BLUE CROSS/BLUE SHIELD | Admitting: Family Medicine

## 2021-07-30 ENCOUNTER — Encounter: Payer: Self-pay | Admitting: Family Medicine

## 2021-07-30 ENCOUNTER — Ambulatory Visit: Payer: BLUE CROSS/BLUE SHIELD | Admitting: Family Medicine

## 2021-07-30 VITALS — BP 124/85 | HR 72 | Temp 98.1°F | Resp 16 | Wt 199.0 lb

## 2021-07-30 DIAGNOSIS — I10 Essential (primary) hypertension: Secondary | ICD-10-CM

## 2021-07-30 DIAGNOSIS — N401 Enlarged prostate with lower urinary tract symptoms: Secondary | ICD-10-CM | POA: Insufficient documentation

## 2021-07-30 DIAGNOSIS — E663 Overweight: Secondary | ICD-10-CM

## 2021-07-30 DIAGNOSIS — K219 Gastro-esophageal reflux disease without esophagitis: Secondary | ICD-10-CM | POA: Diagnosis not present

## 2021-07-30 DIAGNOSIS — I2510 Atherosclerotic heart disease of native coronary artery without angina pectoris: Secondary | ICD-10-CM | POA: Insufficient documentation

## 2021-07-30 DIAGNOSIS — F411 Generalized anxiety disorder: Secondary | ICD-10-CM | POA: Insufficient documentation

## 2021-07-30 DIAGNOSIS — F419 Anxiety disorder, unspecified: Secondary | ICD-10-CM | POA: Diagnosis not present

## 2021-07-30 DIAGNOSIS — R7301 Impaired fasting glucose: Secondary | ICD-10-CM | POA: Insufficient documentation

## 2021-07-30 DIAGNOSIS — F32A Depression, unspecified: Secondary | ICD-10-CM

## 2021-07-30 MED ORDER — PANTOPRAZOLE SODIUM 40 MG PO TBEC: 40.0000 mg | DELAYED_RELEASE_TABLET | Freq: Every day | ORAL | 1 refills | Status: DC

## 2021-07-30 MED ORDER — FLUOXETINE HCL 10 MG PO TABS: 30.0000 mg | ORAL_TABLET | Freq: Every day | ORAL | 1 refills | Status: DC

## 2021-07-30 NOTE — Progress Notes (Signed)
? ?Established Patient Office Visit ? ?Subjective   ? ?Patient ID: [name], male    DOB: 12-03-1965  Age: 56 y.o. MRN: [id number] ? ?CC:  ?Chief Complaint  ?Patient presents with  ? Weight Check  ? ? ?HPI ?[name] presents for routine follow up of chronic med issues including anxiety/depression and GERD. Patient denies acute complaints or concerns.  ? ? ?Outpatient Encounter Medications as of 07/30/2021  ?Medication Sig  ? amLODipine (NORVASC) 10 MG tablet Take 1 tablet (10 mg total) by mouth daily.  ? aspirin EC 81 MG EC tablet Take 1 tablet (81 mg total) by mouth daily.  ? atorvastatin (LIPITOR) 40 MG tablet Take 1 tablet (40 mg total) by mouth daily.  ? clopidogrel (PLAVIX) 75 MG tablet Take 1 tablet (75 mg total) by mouth daily.  ? ezetimibe (ZETIA) 10 MG tablet Take 1 tablet (10 mg total) by mouth daily.  ? fenofibrate (TRICOR) 145 MG tablet Take 1 tablet (145 mg total) by mouth daily.  ? finasteride (PROSCAR) 5 MG tablet TAKE 1 TABLET BY MOUTH EVERY DAY  ? hydrochlorothiazide (HYDRODIURIL) 25 MG tablet Take 1 tablet (25 mg total) by mouth daily.  ? icosapent Ethyl (VASCEPA) 1 g capsule Take 2 capsules (2 g total) by mouth 2 (two) times daily.  ? isosorbide mononitrate (IMDUR) 60 MG 24 hr tablet Take 1.5 tablets (90 mg total) by mouth daily.  ? levocetirizine (XYZAL) 5 MG tablet Take 5 mg by mouth every evening.  ? lisinopril (ZESTRIL) 40 MG tablet Take 1 tablet (40 mg total) by mouth daily.  ? metoprolol succinate (TOPROL-XL) 50 MG 24 hr tablet Take 1 tablet (50 mg total) by mouth daily. Take with or immediately following a meal.  ? nitroGLYCERIN (NITROSTAT) 0.4 MG SL tablet Place 1 tablet (0.4 mg total) under the tongue every 5 (five) minutes x 3 doses as needed for chest pain.  ? Semaglutide-Weight Management 2.4 MG/0.75ML SOAJ Inject 2.4 mg into the skin once a week.  ? silodosin (RAPAFLO) 8 MG CAPS capsule Take 8 mg by mouth daily.  ? spironolactone (ALDACTONE) 25 MG tablet Take 1 tablet  (25 mg total) by mouth daily.  ? [DISCONTINUED] FLUoxetine (PROZAC) 10 MG tablet Take 3 tablets (30 mg total) by mouth daily.  ? [DISCONTINUED] pantoprazole (PROTONIX) 40 MG tablet Take 1 tablet (40 mg total) by mouth daily.  ? alfuzosin (UROXATRAL) 10 MG 24 hr tablet TAKE 1 TABLET BY MOUTH EVERYDAY AT BEDTIME  ? FLUoxetine (PROZAC) 10 MG tablet Take 3 tablets (30 mg total) by mouth daily.  ? KLOR-CON M10 10 MEQ tablet Take 10 mEq by mouth daily.  ? MYRBETRIQ 25 MG TB24 tablet TAKE 1 TABLET BY MOUTH EVERY DAY  ? pantoprazole (PROTONIX) 40 MG tablet Take 1 tablet (40 mg total) by mouth daily.  ? [DISCONTINUED] FLUoxetine (PROZAC) 10 MG capsule Take 30 mg by mouth every morning.  ? [DISCONTINUED] nebivolol (BYSTOLIC) 10 MG tablet Take 10 mg by mouth daily.  ? ?No facility-administered encounter medications on file as of 07/30/2021.  ? ? ?Past Medical History:  ?Diagnosis Date  ? Anxiety   ? Depression   ? Hyperlipidemia   ? Hypertension   ? Lumbar herniated disc dx'd 03/2015  ? NSTEMI (non-ST elevated myocardial infarction) ([place]) 04/15/2015  ? ? ?Past Surgical History:  ?Procedure Laterality Date  ? BACK SURGERY    ? CARDIAC CATHETERIZATION  04/15/2015  ? CARDIAC CATHETERIZATION [name] 04/15/2015  ? Procedure: Left Heart Cath and  Coronary Angiography;  Surgeon: Lorretta Harp, MD;  Location: [facility];  Service: Cardiovascular;  Laterality: [name];  ? CARDIAC CATHETERIZATION [name] 05/20/2015  ? Procedure: Left Heart Cath and Cors/Grafts Angiography;  Surgeon: Peter M Martinique, MD;  Location: [facility];  Service: Cardiovascular;  Laterality: [name];  ? CORONARY ARTERY BYPASS GRAFT [name] 04/18/2015  ? Procedure: CORONARY ARTERY BYPASS GRAFTING times four with LIMA  to LAD, LEFT RADIAL ARTERY to OM, Right saphenous vein harvesting and utilized to Interm and to PD [place] ([place]) RIGHT THIGH;  Surgeon: Grace Isaac, MD;  Location: [facility];  Service: Open Heart Surgery;  Laterality: [name];  ?  LUMBAR LAMINECTOMY  2002  ? RADIAL ARTERY HARVEST Left 04/18/2015  ? Procedure:  LEFT Arm RADIAL ARTERY HARVEST;  Surgeon: Grace Isaac, MD;  Location: [facility];  Service: Open Heart Surgery;  Laterality: Left;  ? TEE WITHOUT CARDIOVERSION [name] 04/18/2015  ? Procedure: TRANSESOPHAGEAL ECHOCARDIOGRAM (TEE);  Surgeon: Grace Isaac, MD;  Location: [place];  Service: Open Heart Surgery;  Laterality: [name];  ? TONSILLECTOMY  1960s  ? ? ?Family History  ?Problem Relation Age of Onset  ? Heart attack Father 37  ?     Deceased at this age  ? Cancer Father   ?     Lung  ? Diabetes Sister   ? ? ?Social History  ? ?Socioeconomic History  ? Marital status: Married  ?  Spouse name: Not on file  ? Number of children: Not on file  ? Years of education: Not on file  ? Highest education level: Not on file  ?Occupational History  ? Not on file  ?Tobacco Use  ? Smoking status: Some Days  ?  Packs/day: 1.50  ?  Years: 33.00  ?  Pack years: 49.50  ?  Types: Cigarettes  ?  Last attempt to quit: 04/15/2015  ?  Years since quitting: 6.2  ? Smokeless tobacco: Never  ?Vaping Use  ? Vaping Use: Never used  ?Substance and Sexual Activity  ? Alcohol use: Yes  ?  Alcohol/week: 0.0 standard drinks  ?  Comment: occasionally  ? Drug use: Not Currently  ?  Types: Marijuana, Cocaine  ?  Comment: "in my teens"  ? Sexual activity: Not on file  ?Other Topics Concern  ? Not on file  ?Social History Narrative  ? Not on file  ? ?Social Determinants of Health  ? ?Financial Resource Strain: Not on file  ?Food Insecurity: Not on file  ?Transportation Needs: Not on file  ?Physical Activity: Not on file  ?Stress: Not on file  ?Social Connections: Not on file  ?Intimate Partner Violence: Not on file  ? ? ?Review of Systems  ?Psychiatric/Behavioral:  Negative for depression. The patient is not nervous/anxious.   ?All other systems reviewed and are negative. ? ?  ? ? ?Objective   ? ?BP 124/85   Pulse 72   Temp 98.1 ?F (36.7 ?C) (Oral)   Resp 16   Wt 199 lb  (90.3 kg)   SpO2 97%   BMI 27.75 kg/m?  ? ?Physical Exam ?Vitals and nursing note reviewed.  ?Constitutional:   ?   General: He is not in acute distress. ?Cardiovascular:  ?   Rate and Rhythm: Normal rate and regular rhythm.  ?Pulmonary:  ?   Effort: Pulmonary effort is normal.  ?   Breath sounds: Normal breath sounds.  ?Abdominal:  ?   Palpations:  Abdomen is soft.  ?   Tenderness: There is no abdominal tenderness.  ?Musculoskeletal:  ?   Right lower leg: No edema.  ?   Left lower leg: No edema.  ?Neurological:  ?   General: No focal deficit present.  ?   Mental Status: He is alert and oriented to person, place, and time.  ?Psychiatric:     ?   Mood and Affect: Mood normal.     ?   Behavior: Behavior normal.  ? ? ? ?  ? ?Assessment & Plan:  ? ?1. Anxiety and depression ?Appears stable with present management. Continue and monitor. Meds refilled.  ?- FLUoxetine (PROZAC) 10 MG tablet; Take 3 tablets (30 mg total) by mouth daily.  Dispense: 270 tablet; Refill: 1 ? ?2. Gastroesophageal reflux disease without esophagitis ?Appears stable with present management. Continue. Meds refilled.  ?- pantoprazole (PROTONIX) 40 MG tablet; Take 1 tablet (40 mg total) by mouth daily.  Dispense: 90 tablet; Refill: 1 ? ?3. Essential hypertension ?Management as per consultant.  ? ?4. Overweight (BMI 25.0-29.9) ?Management as per consultant.  ? ? ? ?Return in about 6 months (around 01/30/2022) for follow up.  ? ?Becky Sax, MD ? ? ?

## 2021-07-30 NOTE — Progress Notes (Signed)
Patient is here for weight check  . Patient has been on Semaglutide-weight management 2.4mg . Patient said he is doing much better ?

## 2021-08-11 ENCOUNTER — Other Ambulatory Visit: Payer: Self-pay | Admitting: *Deleted

## 2021-08-11 MED ORDER — ATORVASTATIN CALCIUM 40 MG PO TABS: 40.0000 mg | ORAL_TABLET | Freq: Every day | ORAL | 3 refills | Status: DC

## 2021-08-24 ENCOUNTER — Other Ambulatory Visit: Payer: Self-pay | Admitting: *Deleted

## 2021-08-24 MED ORDER — EZETIMIBE 10 MG PO TABS: 10.0000 mg | ORAL_TABLET | Freq: Every day | ORAL | 1 refills | Status: DC

## 2021-09-04 ENCOUNTER — Telehealth: Payer: Self-pay | Admitting: Cardiology

## 2021-09-04 NOTE — Telephone Encounter (Signed)
Spoke w patient and his wife. Yesterday and today a couple hours after morning meds he becomes lightheaded.  Yesterday morning he jumped out of his truck and was standing speaking w a coworker and the lightheadedness came on and he felt like he would black out. The longer he stood there it began to pass.  BPs as noted are yesterday and today only.    We discussed BP meds and hydration.  He does not "drink as much as I should".  And will increase fluid intake daily.  Amlodipine 10 mg am Hctz 25 mg am Isosorbide 90 mg am Toprol XL 50 mg am Lisinopril 40 mg pm Spironolactone 25mg  pm Also takes finasteride at bedtime.  I asked him to move his Toprol XL and isosorbide to bedtime and increase hydration.  He will monitor symptoms and BPs a 2-3 hrs after morning medications.  Aware I will forward to Dr. and he will be called back with any further recommendations.

## 2021-09-04 NOTE — Telephone Encounter (Signed)
Pt c/o BP issue: STAT if pt c/o blurred vision, one-sided weakness or slurred speech  1. What are your last 5 BP readings?  108/69 102/73   2. Are you having any other symptoms (ex. Dizziness, headache, blurred vision, passed out)? Dizziness and lightheadedness  3. What is your BP issue? Pt is concerned about his low BP. States that he almost blacked out yesterday.

## 2021-09-06 NOTE — Telephone Encounter (Signed)
Agree with plan. Will follow-up BP log.

## 2021-09-14 ENCOUNTER — Other Ambulatory Visit: Payer: Self-pay

## 2021-09-14 DIAGNOSIS — E785 Hyperlipidemia, unspecified: Secondary | ICD-10-CM

## 2021-09-14 DIAGNOSIS — I10 Essential (primary) hypertension: Secondary | ICD-10-CM

## 2021-09-14 DIAGNOSIS — Z951 Presence of aortocoronary bypass graft: Secondary | ICD-10-CM

## 2021-09-14 DIAGNOSIS — I2510 Atherosclerotic heart disease of native coronary artery without angina pectoris: Secondary | ICD-10-CM

## 2021-09-14 DIAGNOSIS — Z79899 Other long term (current) drug therapy: Secondary | ICD-10-CM

## 2021-09-14 MED ORDER — HYDROCHLOROTHIAZIDE 25 MG PO TABS: 25.0000 mg | ORAL_TABLET | Freq: Every day | ORAL | 2 refills | Status: DC

## 2021-09-18 ENCOUNTER — Other Ambulatory Visit: Payer: Self-pay

## 2021-09-18 DIAGNOSIS — Z79899 Other long term (current) drug therapy: Secondary | ICD-10-CM

## 2021-09-18 DIAGNOSIS — Z951 Presence of aortocoronary bypass graft: Secondary | ICD-10-CM

## 2021-09-18 DIAGNOSIS — E782 Mixed hyperlipidemia: Secondary | ICD-10-CM

## 2021-09-18 DIAGNOSIS — I10 Essential (primary) hypertension: Secondary | ICD-10-CM

## 2021-09-18 DIAGNOSIS — I2510 Atherosclerotic heart disease of native coronary artery without angina pectoris: Secondary | ICD-10-CM

## 2021-09-18 DIAGNOSIS — E785 Hyperlipidemia, unspecified: Secondary | ICD-10-CM

## 2021-09-18 MED ORDER — CLOPIDOGREL BISULFATE 75 MG PO TABS: 75.0000 mg | ORAL_TABLET | Freq: Every day | ORAL | 2 refills | Status: DC

## 2021-09-18 MED ORDER — ICOSAPENT ETHYL 1 G PO CAPS: 2.0000 g | ORAL_CAPSULE | Freq: Two times a day (BID) | ORAL | 2 refills | Status: DC

## 2021-09-18 MED ORDER — FENOFIBRATE 145 MG PO TABS: 145.0000 mg | ORAL_TABLET | Freq: Every day | ORAL | 2 refills | Status: DC

## 2021-09-18 NOTE — Addendum Note (Signed)
Addended by: Margaret Pyle D on: 09/18/2021 02:05 PM   Modules accepted: Orders

## 2021-09-18 NOTE — Addendum Note (Signed)
Addended by: Margaret Pyle D on: 09/18/2021 02:07 PM   Modules accepted: Orders

## 2021-09-26 ENCOUNTER — Encounter: Payer: Self-pay | Admitting: Cardiology

## 2021-10-01 ENCOUNTER — Encounter: Payer: Self-pay | Admitting: *Deleted

## 2021-10-14 ENCOUNTER — Telehealth: Payer: Self-pay | Admitting: *Deleted

## 2021-10-14 NOTE — Chronic Care Management (AMB) (Signed)
  Care Coordination  Note  10/14/2021 Name: [name] MRN: [id number] DOB: 1965/12/09  [name] is a 56 y.o. year old male who is a primary care patient of Georganna Skeans, MD. I reached out to [employer] by phone today to offer care coordination services.      [name] was given information about Care Coordination services today including:  The Care Coordination services include support from the care team which includes your Nurse Coordinator, Clinical Social Worker, or Pharmacist.  The Care Coordination team is here to help remove barriers to the health concerns and goals most important to you. Care Coordination services are voluntary and the patient may decline or stop services at any time by request to their care team member.   Patient did not agree to participate in care coordination services at this time.  [facility]  Care Coordination Care Guide  Direct Dial: [phone]

## 2021-10-14 NOTE — Chronic Care Management (AMB) (Signed)
  Care Management   Outreach Note  10/14/2021 Name: [name] MRN: [id number] DOB: [birth year]  An unsuccessful telephone outreach was attempted today. The patient was referred to the case management team for assistance with care management and care coordination.   Follow Up Plan:  A HIPAA compliant phone message was left for the patient providing contact information and requesting a return call.  The care management team will reach out to the patient again over the next 7 days.  If patient returns call to provider office, please advise to call Embedded Care Management Care Guide Misty Stanley* at [phone].Misty Stanley [facility]  Care Coordination Care Guide  Direct Dial: [phone]

## 2021-10-28 ENCOUNTER — Ambulatory Visit: Payer: Self-pay | Admitting: Licensed Clinical Social Worker

## 2021-10-28 NOTE — Patient Outreach (Signed)
  Care Coordination   Initial Visit Note   10/28/2021 Name: Erica Osuna MRN: [id number] DOB: 28-Aug-1965  Steen Bisig is a 56 y.o. year old male who sees Georganna Skeans, MD for primary care. I spoke with  [name] by phone today  What matters to the patients health and wellness today?  Patient experiences symptoms of a head cold.    Goals Addressed               This Visit's Progress     Care Coordination (pt-stated)        . SDOH and NEEDS assessment completed. NO additional barriers or interventions needed.   . Patient suffering from what he believes is a head cold. Patient denied needing RN to outreach. SW provided patient with contact information if an appointment is needed.         SDOH assessments and interventions completed:  Yes     Care Coordination Interventions Activated:  Yes  Care Coordination Interventions:  Yes, provided   Follow up plan: No further intervention required.   Encounter Outcome:  Pt. Visit Completed   Ander Gaster , MSW Social Worker [facility] Care Management  [phone]

## 2021-10-28 NOTE — Patient Instructions (Signed)
Visit Information  Instructions:   Patient was given the following information about care management and care coordination services today, agreed to services, and gave verbal consent: 1.care management/care coordination services include personalized support from designated clinical staff supervised by their physician, including individualized plan of care and coordination with other care providers 2. 24/7 contact phone numbers for assistance for urgent and routine care needs. 3. The patient may stop care management/care coordination services at any time by phone call to the office staff.  Patient verbalizes understanding of instructions and care plan provided today and agrees to view in MyChart. Active MyChart status and patient understanding of how to access instructions and care plan via MyChart confirmed with patient.     No further follow up required: .  Walid Haig, BSW , MSW Social Worker [facility] Care Management  [phone]      

## 2021-11-25 ENCOUNTER — Other Ambulatory Visit: Payer: Self-pay | Admitting: Family Medicine

## 2021-11-25 DIAGNOSIS — K219 Gastro-esophageal reflux disease without esophagitis: Secondary | ICD-10-CM

## 2021-12-21 ENCOUNTER — Ambulatory Visit: Payer: Self-pay

## 2021-12-21 DIAGNOSIS — R197 Diarrhea, unspecified: Secondary | ICD-10-CM | POA: Diagnosis not present

## 2021-12-21 DIAGNOSIS — R1110 Vomiting, unspecified: Secondary | ICD-10-CM | POA: Diagnosis not present

## 2021-12-21 NOTE — Telephone Encounter (Signed)
   Chief Complaint: Diarrhea. Asking to be worked in today. Symptoms: Some vomiting Frequency: Last Wednesday Pertinent Negatives: Patient denies fever Disposition: [] ED /[] Urgent Care (no appt availability in office) / [] Appointment(In office/virtual)/ []  [facility]/ [] Home Care/ [] Refused Recommended Disposition /[] [facility]/ [x]  Follow-up with PCP Additional Notes: Please advise pt. Offered [facility], declines.   Answer Assessment - Initial Assessment Questions 1. DIARRHEA SEVERITY: "How bad is the diarrhea?" "How many more stools have you had in the past 24 hours than normal?"    - NO DIARRHEA (SCALE 0)   - MILD (SCALE 1-3): Few loose or mushy BMs; increase of 1-3 stools over normal daily number of stools; mild increase in ostomy output.   -  MODERATE (SCALE 4-7): Increase of 4-6 stools daily over normal; moderate increase in ostomy output.   -  SEVERE (SCALE 8-10; OR "WORST POSSIBLE"): Increase of 7 or more stools daily over normal; moderate increase in ostomy output; incontinence.     10 2. ONSET: "When did the diarrhea begin?"      Last week 3. BM CONSISTENCY: "How loose or watery is the diarrhea?"      WATERY 4. VOMITING: "Are you also vomiting?" If Yes, ask: "How many times in the past 24 hours?"      Comes and goes 5. ABDOMEN PAIN: "Are you having any abdomen pain?" If Yes, ask: "What does it feel like?" (e.g., crampy, dull, intermittent, constant)      Cramping 6. ABDOMEN PAIN SEVERITY: If present, ask: "How bad is the pain?"  (e.g., Scale 1-10; mild, moderate, or severe)   - MILD (1-3): doesn't interfere with normal activities, abdomen soft and not tender to touch    - MODERATE (4-7): interferes with normal activities or awakens from sleep, abdomen tender to touch    - SEVERE (8-10): excruciating pain, doubled over, unable to do any normal activities       -23 7. ORAL INTAKE: If vomiting, "Have you been able to drink liquids?" "How mucH  liquids have you had in the past 24 hours?"     No 8. HYDRATION: "Any signs of dehydration?" (e.g., dry mouth [not just dry lips], too weak to stand, dizziness, new weight loss) "When did you last urinate?"     No 9. EXPOSURE: "Have you traveled to a foreign country recently?" "Have you been exposed to anyone with diarrhea?" "Could you have eaten any food that was spoiled?"     No 10. ANTIBIOTIC USE: "Are you taking antibiotics now or have you taken antibiotics in the past 2 months?"       No 11. OTHER SYMPTOMS: "Do you have any other symptoms?" (e.g., fever, blood in stool)       No 12. PREGNANCY: "Is there any chance you are pregnant?" "When was your last menstrual period?"       [name]  Protocols used: [facility]

## 2021-12-21 NOTE — Telephone Encounter (Signed)
I have attempted without success to contact this patient by phone to return their call.

## 2022-01-25 ENCOUNTER — Telehealth: Payer: Self-pay | Admitting: Pharmacist

## 2022-01-25 ENCOUNTER — Other Ambulatory Visit: Payer: Self-pay | Admitting: Pharmacist

## 2022-01-25 MED ORDER — SEMAGLUTIDE-WEIGHT MANAGEMENT 2.4 MG/0.75ML ~~LOC~~ SOAJ: 2.4000 mg | SUBCUTANEOUS | 6 refills | Status: DC

## 2022-01-25 NOTE — Telephone Encounter (Signed)
[facility] PA renewal sbumitted Key: [id number]

## 2022-01-26 NOTE — Telephone Encounter (Signed)
Approved through 07/26/22

## 2022-01-29 ENCOUNTER — Ambulatory Visit: Payer: BLUE CROSS/BLUE SHIELD | Admitting: Family Medicine

## 2022-01-29 ENCOUNTER — Encounter: Payer: Self-pay | Admitting: Family Medicine

## 2022-01-29 DIAGNOSIS — K219 Gastro-esophageal reflux disease without esophagitis: Secondary | ICD-10-CM | POA: Diagnosis not present

## 2022-01-29 DIAGNOSIS — F419 Anxiety disorder, unspecified: Secondary | ICD-10-CM

## 2022-01-29 DIAGNOSIS — F32A Depression, unspecified: Secondary | ICD-10-CM | POA: Diagnosis not present

## 2022-01-29 MED ORDER — PANTOPRAZOLE SODIUM 40 MG PO TBEC: 40.0000 mg | DELAYED_RELEASE_TABLET | Freq: Every day | ORAL | 1 refills | Status: DC

## 2022-01-29 MED ORDER — FLUOXETINE HCL 10 MG PO TABS: 30.0000 mg | ORAL_TABLET | Freq: Every day | ORAL | 1 refills | Status: DC

## 2022-01-29 NOTE — Progress Notes (Unsigned)
Patient is here for their 6 month follow-up Patient has no concerns today Care gaps have been discussed with patient  .medication refill request

## 2022-02-01 ENCOUNTER — Encounter: Payer: Self-pay | Admitting: Family Medicine

## 2022-02-01 NOTE — Progress Notes (Signed)
Established Patient Office Visit  Subjective    Patient ID: [name], male    DOB: 03-12-1966  Age: 56 y.o. MRN: [id number]  CC:  Chief Complaint  Patient presents with   Follow-up    6 month    HPI [name] presents for routine follow up of chronic med issues. Patient denies acute complaints or concerns.    Outpatient Encounter Medications as of 01/29/2022  Medication Sig   amLODipine (NORVASC) 10 MG tablet Take 1 tablet (10 mg total) by mouth daily.   aspirin EC 81 MG EC tablet Take 1 tablet (81 mg total) by mouth daily.   clopidogrel (PLAVIX) 75 MG tablet Take 1 tablet (75 mg total) by mouth daily.   ezetimibe (ZETIA) 10 MG tablet Take 1 tablet (10 mg total) by mouth daily.   fenofibrate (TRICOR) 145 MG tablet Take 1 tablet (145 mg total) by mouth daily.   finasteride (PROSCAR) 5 MG tablet TAKE 1 TABLET BY MOUTH EVERY DAY   FLUoxetine (PROZAC) 10 MG tablet Take 3 tablets (30 mg total) by mouth daily.   hydrochlorothiazide (HYDRODIURIL) 25 MG tablet Take 1 tablet (25 mg total) by mouth daily.   icosapent Ethyl (VASCEPA) 1 g capsule Take 2 capsules (2 g total) by mouth 2 (two) times daily.   isosorbide mononitrate (IMDUR) 60 MG 24 hr tablet Take 1.5 tablets (90 mg total) by mouth daily.   levocetirizine (XYZAL) 5 MG tablet Take 5 mg by mouth every evening.   lisinopril (ZESTRIL) 40 MG tablet Take 1 tablet (40 mg total) by mouth daily.   metoprolol succinate (TOPROL-XL) 50 MG 24 hr tablet Take 1 tablet (50 mg total) by mouth daily. Take with or immediately following a meal.   nitroGLYCERIN (NITROSTAT) 0.4 MG SL tablet Place 1 tablet (0.4 mg total) under the tongue every 5 (five) minutes x 3 doses as needed for chest pain.   Omega-3 Fatty Acids (FISH OIL) 1000 MG CAPS 1 capsule   pantoprazole (PROTONIX) 40 MG tablet Take 1 tablet (40 mg total) by mouth daily.   Semaglutide-Weight Management 2.4 MG/0.75ML SOAJ Inject 2.4 mg into the skin once a week.   silodosin  (RAPAFLO) 8 MG CAPS capsule Take 8 mg by mouth daily.   spironolactone (ALDACTONE) 25 MG tablet Take 1 tablet (25 mg total) by mouth daily.   [DISCONTINUED] atorvastatin (LIPITOR) 40 MG tablet Take 1 tablet (40 mg total) by mouth daily.   [DISCONTINUED] FLUoxetine (PROZAC) 10 MG tablet Take 3 tablets (30 mg total) by mouth daily.   [DISCONTINUED] pantoprazole (PROTONIX) 40 MG tablet TAKE 1 TABLET BY MOUTH EVERY DAY   No facility-administered encounter medications on file as of 01/29/2022.    Past Medical History:  Diagnosis Date   Anxiety    Depression    Hyperlipidemia    Hypertension    Lumbar herniated disc dx'd 03/2015   NSTEMI (non-ST elevated myocardial infarction) (HCC) 04/15/2015    Past Surgical History:  Procedure Laterality Date   BACK SURGERY     CARDIAC CATHETERIZATION  04/15/2015   CARDIAC CATHETERIZATION [name] 04/15/2015   Procedure: Left Heart Cath and Coronary Angiography;  Surgeon: Runell Gess, MD;  Location: [facility] INVASIVE CV LAB;  Service: Cardiovascular;  Laterality: [name];   CARDIAC CATHETERIZATION [name] 05/20/2015   Procedure: Left Heart Cath and Cors/Grafts Angiography;  Surgeon: Peter M Swaziland, MD;  Location: [facility] INVASIVE CV LAB;  Service: Cardiovascular;  Laterality: [name];   CORONARY ARTERY BYPASS GRAFT [name] 04/18/2015  Procedure: CORONARY ARTERY BYPASS GRAFTING times four with LIMA  to LAD, LEFT RADIAL ARTERY to OM, Right saphenous vein harvesting and utilized to Interm and to PD ENDOSCOPIC GREATER SAPHENOUS VEIN HARVEST (EVH) RIGHT THIGH;  Surgeon: Delight Ovens, MD;  Location: [facility] OR;  Service: Open Heart Surgery;  Laterality: [name];   LUMBAR LAMINECTOMY  2002   RADIAL ARTERY HARVEST Left 04/18/2015   Procedure:  LEFT Arm RADIAL ARTERY HARVEST;  Surgeon: Delight Ovens, MD;  Location: [facility] OR;  Service: Open Heart Surgery;  Laterality: Left;   TEE WITHOUT CARDIOVERSION [name] 04/18/2015   Procedure: TRANSESOPHAGEAL ECHOCARDIOGRAM (TEE);  Surgeon: Delight Ovens, MD;   Location: [facility] OR;  Service: Open Heart Surgery;  Laterality: [name];   TONSILLECTOMY  1960s    Family History  Problem Relation Age of Onset   Heart attack Father 67       Deceased at this age   Cancer Father        Lung   Diabetes Sister     Social History   Socioeconomic History   Marital status: Married    Spouse name: Not on file   Number of children: Not on file   Years of education: Not on file   Highest education level: Not on file  Occupational History   Not on file  Tobacco Use   Smoking status: Some Days    Packs/day: 1.50    Years: 33.00    Total pack years: 49.50    Types: Cigarettes    Last attempt to quit: 04/15/2015    Years since quitting: 6.8   Smokeless tobacco: Never  Vaping Use   Vaping Use: Never used  Substance and Sexual Activity   Alcohol use: Yes    Alcohol/week: 0.0 standard drinks of alcohol    Comment: occasionally   Drug use: Not Currently    Types: Marijuana, Cocaine    Comment: "in my teens"   Sexual activity: Not on file  Other Topics Concern   Not on file  Social History Narrative   Not on file   Social Determinants of Health   Financial Resource Strain: Not on file  Food Insecurity: No Food Insecurity (10/28/2021)   Hunger Vital Sign    Worried About Running Out of Food in the Last Year: Never true    Ran Out of Food in the Last Year: Never true  Transportation Needs: No Transportation Needs (10/28/2021)   PRAPARE - Administrator, Civil Service (Medical): No    Lack of Transportation (Non-Medical): No  Physical Activity: Not on file  Stress: Not on file  Social Connections: Not on file  Intimate Partner Violence: Not on file    Review of Systems  All other systems reviewed and are negative.       Objective    BP 129/84   Pulse 62   Temp 98.1 F (36.7 C) (Oral)   Resp 16   Wt 206 lb 12.8 oz (93.8 kg)   SpO2 96%   BMI 28.84 kg/m   Physical Exam Vitals and nursing note reviewed.  Constitutional:       General: He is not in acute distress. Cardiovascular:     Rate and Rhythm: Normal rate and regular rhythm.  Pulmonary:     Effort: Pulmonary effort is normal.     Breath sounds: Normal breath sounds.  Abdominal:     Palpations: Abdomen is soft.     Tenderness: There is no abdominal tenderness.  Neurological:     General: No focal deficit present.     Mental Status: He is alert and oriented to person, place, and time.         Assessment & Plan:   1. Gastroesophageal reflux disease without esophagitis Appears stable. Continue. Meds refilled.  - pantoprazole (PROTONIX) 40 MG tablet; Take 1 tablet (40 mg total) by mouth daily.  Dispense: 90 tablet; Refill: 1  2. Anxiety and depression Appears stable. Continue. Meds refilled.  - FLUoxetine (PROZAC) 10 MG tablet; Take 3 tablets (30 mg total) by mouth daily.  Dispense: 270 tablet; Refill: 1    Return in about 6 months (around 07/30/2022) for physical.   Tommie Raymond, MD

## 2022-02-16 NOTE — Progress Notes (Unsigned)
Cardiology Office Note:    Date:  02/16/2022   ID:  [name], DOB 1966-02-17, MRN [id number]  PCP:  Georganna Skeans, MD   [facility] Providers Cardiologist:  Tobias Alexander, MD {   Referring MD: Georganna Skeans, MD    History of Present Illness:    [name] is a 56 y.o. male with a hx of CAD s/p CABG x 4 04/18/2015 by Dr. Tyrone Sage (LIMA to LAD, Left radial artery to OM, SVG to Intermediate, and SVG to PD), post operative atrial fibrillation managed with amiodarone, HTN, depression and anxiety who was previously followed by Dr. Delton See who now returns to clinic for follow-up.   Patient has history of CAD s/p CABG in 2017 as detailed above.  1 month after his CABG, he was readmitted 04/2015 for recurrent CP and hypertensive urgency. Enzyme elevation was c/w NSTEMI. Subsequently, he underwent repeat LHC which demonstrated occlusion of the SVG to the ramus intermedius. The native vessel had moderate ostial stenosis but with TIMI 3 flow without other obstructive disease. It is possible that with occlusion of the SVG there may have been some thrombus showered downstream. His remaining 3 grafts were patent. Medical management was recommended. He was continued on ASA, a statin, BB and ARB. Plavix and a long acting nitrate were added to his regimen. He was also prescribed PRN SL NTG. He did well with medical therapy.  Patient was last seen in clinic on 07/07/20 where he was having more dyspnea on exertion and intermittent exertional chest pain. Myoview 07/16/20 with no evidence of ischemia or infarction. EF 47%. Lung cancer screening CT with no nodules, underlying CAD and aortic sclerosis and COPD.  Last seen in clinic in 05/2021 where he was doing well from a CV perspective. Continued to smoke.  Today, ***  Past Medical History:  Diagnosis Date   Anxiety    Depression    Hyperlipidemia    Hypertension    Lumbar herniated disc dx'd 03/2015   NSTEMI (non-ST elevated myocardial  infarction) (HCC) 04/15/2015    Past Surgical History:  Procedure Laterality Date   BACK SURGERY     CARDIAC CATHETERIZATION  04/15/2015   CARDIAC CATHETERIZATION [name] 04/15/2015   Procedure: Left Heart Cath and Coronary Angiography;  Surgeon: Runell Gess, MD;  Location: [facility] INVASIVE CV LAB;  Service: Cardiovascular;  Laterality: [name];   CARDIAC CATHETERIZATION [name] 05/20/2015   Procedure: Left Heart Cath and Cors/Grafts Angiography;  Surgeon: Peter M Swaziland, MD;  Location: [facility] INVASIVE CV LAB;  Service: Cardiovascular;  Laterality: [name];   CORONARY ARTERY BYPASS GRAFT [name] 04/18/2015   Procedure: CORONARY ARTERY BYPASS GRAFTING times four with LIMA  to LAD, LEFT RADIAL ARTERY to OM, Right saphenous vein harvesting and utilized to Interm and to PD ENDOSCOPIC GREATER SAPHENOUS VEIN HARVEST (EVH) RIGHT THIGH;  Surgeon: Delight Ovens, MD;  Location: [facility] OR;  Service: Open Heart Surgery;  Laterality: [name];   LUMBAR LAMINECTOMY  2002   RADIAL ARTERY HARVEST Left 04/18/2015   Procedure:  LEFT Arm RADIAL ARTERY HARVEST;  Surgeon: Delight Ovens, MD;  Location: [facility] OR;  Service: Open Heart Surgery;  Laterality: Left;   TEE WITHOUT CARDIOVERSION [name] 04/18/2015   Procedure: TRANSESOPHAGEAL ECHOCARDIOGRAM (TEE);  Surgeon: Delight Ovens, MD;  Location: [facility] OR;  Service: Open Heart Surgery;  Laterality: [name];   TONSILLECTOMY  1960s    Current Medications: No outpatient medications have been marked as taking for the 02/17/22 encounter (Appointment) with Meriam Sprague, MD.  Allergies:   Atorvastatin, Claritin [loratadine], Codeine, and Vicodin [hydrocodone-acetaminophen]   Social History   Socioeconomic History   Marital status: Married    Spouse name: Not on file   Number of children: Not on file   Years of education: Not on file   Highest education level: Not on file  Occupational History   Not on file  Tobacco Use   Smoking status: Some Days    Packs/day: 1.50    Years: 33.00     Total pack years: 49.50    Types: Cigarettes    Last attempt to quit: 04/15/2015    Years since quitting: 6.8   Smokeless tobacco: Never  Vaping Use   Vaping Use: Never used  Substance and Sexual Activity   Alcohol use: Yes    Alcohol/week: 0.0 standard drinks of alcohol    Comment: occasionally   Drug use: Not Currently    Types: Marijuana, Cocaine    Comment: "in my teens"   Sexual activity: Not on file  Other Topics Concern   Not on file  Social History Narrative   Not on file   Social Determinants of Health   Financial Resource Strain: Not on file  Food Insecurity: No Food Insecurity (10/28/2021)   Hunger Vital Sign    Worried About Running Out of Food in the Last Year: Never true    Ran Out of Food in the Last Year: Never true  Transportation Needs: No Transportation Needs (10/28/2021)   PRAPARE - Administrator, Civil ServiceTransportation    Lack of Transportation (Medical): No    Lack of Transportation (Non-Medical): No  Physical Activity: Not on file  Stress: Not on file  Social Connections: Not on file     Family History: The patient's family history includes Cancer in his father; Diabetes in his sister; Heart attack (age of onset: 2251) in his father.  ROS:   Please see the history of present illness.    Review of Systems  Constitutional:  Negative for chills and fever.  HENT:  Negative for sore throat.   Eyes:  Negative for blurred vision.  Cardiovascular:  Negative for chest pain, palpitations, orthopnea, claudication, leg swelling and PND.  Gastrointestinal:  Negative for nausea.  Genitourinary:  Negative for dysuria.  Musculoskeletal:  Negative for falls.  Skin:  Negative for itching and rash.  Neurological:  Positive for dizziness. Negative for loss of consciousness.  Endo/Heme/Allergies:  Does not bruise/bleed easily.  Psychiatric/Behavioral:  Negative for substance abuse.      EKGs/Labs/Other Studies Reviewed:    The following studies were reviewed today: Carotid Arterial  Duplex 10/22/20 Right Carotid: Velocities in the right ICA are consistent with a 1-39%  stenosis. Decrease ICA velocities compared to prior exam.   Left Carotid: Velocities in the left ICA are consistent with a 1-39%  stenosis. Decrease ICA velocities compared to prior exam.   Vertebrals:  Left vertebral artery demonstrates antegrade flow. Small  caliber right vertebral artery with atypical antegrade flow.   Subclavians: Normal flow hemodynamics were seen in bilateral subclavian arteries.   Myoview 07/16/20: Nuclear stress EF: 47%. There was no ST segment deviation noted during stress. The study is normal. This is a low risk study.   Low risk stress nuclear study with normal perfusion and mildly reduced global systolic function. Consider echo correlation.  TTE 07/18/19:  1. Inferior basal hypokinesis . Left ventricular ejection fraction, by  estimation, is 60%. The left ventricle has normal function. The left  ventricle has no regional wall  motion abnormalities. Left ventricular  diastolic parameters were normal.   2. Right ventricular systolic function is normal. The right ventricular  size is normal.   3. Left atrial size was moderately dilated.   4. Right atrial size was moderately dilated.   5. The mitral valve is normal in structure. No evidence of mitral valve  regurgitation. No evidence of mitral stenosis.   6. The aortic valve is normal in structure. Aortic valve regurgitation is  not visualized. No aortic stenosis is present.   7. The inferior vena cava is normal in size with greater than 50%  respiratory variability, suggesting right atrial pressure of 3 mmHg.  CT lung caner screen 07/16/20: IMPRESSION: 1. Lung-RADS 2, benign appearance or behavior. Continue annual screening with low-dose chest CT without contrast in 12 months. 2. There is aortic atherosclerosis, in addition to left main and 3 vessel coronary artery disease. Status post median sternotomy for CABG  including LIMA to the LAD. 3. Mild diffuse bronchial wall thickening with very mild centrilobular and paraseptal emphysema; imaging findings suggestive of underlying COPD.   Aortic Atherosclerosis (ICD10-I70.0) and Emphysema (ICD10-J43.9).   TTE: 04/2015 - Left ventricle: The cavity size was normal. Wall thickness was   increased in a pattern of mild LVH. Basal inferior severe   hypokinesis, inferolateral hypokinesis. Systolic function was low   normal to mildly reduced. The estimated ejection fraction was in   the range of 50% to 55%. Doppler parameters are consistent with   abnormal left ventricular relaxation (grade 1 diastolic   dysfunction). - Aortic valve: There was no stenosis. - Ascending aorta: The ascending aorta was mildly dilated at 3.7   cm. - Mitral valve: There was no significant regurgitation. - Left atrium: The atrium was mildly dilated. - Right ventricle: Poorly visualized. - Pulmonary arteries: No complete TR doppler jet so unable to   estimate PA systolic pressure. - Inferior vena cava: The vessel was normal in size. The   respirophasic diameter changes were in the normal range (>= 50%),   consistent with normal central venous pressure. - Pericardium, extracardiac: A trivial pericardial effusion was   identified.   Cath 2017: Mid Cx lesion, 95% stenosed. Mid Cx to Dist Cx lesion, 95% stenosed. Prox LAD to Mid LAD lesion, 50% stenosed. Ost Ramus lesion, 60% stenosed. Prox Cx lesion, 95% stenosed. Prox RCA-1 lesion, 75% stenosed. Prox RCA-2 lesion, 80% stenosed. Mid RCA lesion, 90% stenosed. Dist RCA lesion, 60% stenosed. RPDA lesion, 60% stenosed. SVG to the PDA was injected is very large, and is anatomically normal. There is competitive flow. SVG to the ramus intermediate was injected is very large. Mid Graft to Insertion lesion, 100% stenosed. Radial artery graft to the OM was injected is normal in caliber, and is anatomically normal. There is  competitive flow. LIMA to the LAD was injected is normal in caliber, and is anatomically normal. The left ventricular systolic function is normal.   1. Severe 3 vessel obstructive CAD 2. Patent LIMA to the LAD 3. Patent free radial graft to the OM. This arises from the hood of the SVG to the ramus intermediate branch 4. Occluded mid SVG to the ramus intermediate with thrombus. 5. Patent SVG to the PDA 6. Good LV function.   Plan: It appears that the culprit lesion is occlusion of the SVG to the ramus intermediate. The native vessel does have moderate ostial stenosis but has TIMI 3 flow without other obstructive disease. It is possible that with occlusion of the  SVG there may have been some thrombus showered downstream. At this point would treat medically.   EKG:  EKG was not ordered today  Recent Labs: 03/02/2021: BUN 17; Creatinine, Ser 0.98; Potassium 3.7; Sodium 140  Recent Lipid Panel    Component Value Date/Time   CHOL 115 05/20/2021 0914   TRIG 67 05/20/2021 0914   HDL 43 05/20/2021 0914   CHOLHDL 2.7 05/20/2021 0914   CHOLHDL 3.8 05/20/2015 0514   VLDL 22 05/20/2015 0514   LDLCALC 58 05/20/2021 0914         Physical Exam:    VS:  There were no vitals taken for this visit.    Wt Readings from Last 3 Encounters:  01/29/22 206 lb 12.8 oz (93.8 kg)  07/30/21 199 lb (90.3 kg)  05/20/21 202 lb 3.2 oz (91.7 kg)     GEN:  Well nourished, well developed in no acute distress HEENT: Normal NECK: No JVD; No carotid bruits CARDIAC: RRR, 1/6 murmurs no rubs or gallops RESPIRATORY:  Clear to auscultation without rales, wheezing or rhonchi  ABDOMEN: Soft, non-tender, non-distended MUSCULOSKELETAL:  No edema; No deformity  SKIN: Warm and dry NEUROLOGIC:  Alert and oriented x 3 PSYCHIATRIC:  Normal affect   ASSESSMENT:    No diagnosis found.   PLAN:    In order of problems listed above:  #Multivessel CAD s/p CABG x4 in 2016: Patient with NSTEMI in 03/2014 treated  with CABG x 4 as detailed above. He had a second NSTEMI 04/2015 with cath revealing a failed SVG to RI. His other 3 grafts were patent.  LVEF low normal in 2017 with mild regional wall motion abnormalities now with LVEF 60% on TTE 06/2019 with inferobasal hypokinesis. Recent myoview 06/2020 without evidence of ischemia or infarction. Currently doing well with no anginal symptoms. -Myoview 06/2020 negative for ischemia -Continue ASA 81mg  daily; plavix 75mg  daily -Continue lipitor 40mg  daily and zetia 10mg  daily -Continue lisinopril 40mg  daily -Continue bystolic 10mg  daily -Continue imdur 90mg  daily   #Post operative atrial fibrillation:  Occurred post CABG and managed with short term amio. No recurrence since his bypass surgery. -Continue to monitor--off AC   #HTN: Well controlled and at goal <120s/80s. -Continue lisinopril 40mg  daily -Continue bystolic 10mg  daily -Continue amlodipine 10mg  daily -Continue imdur 90mg  daily   #Mixed Hyperlipidemia: LDL 58, TG 67, HDL 43.  -Continue lipitor 40mg  daily and zetia 10mg  daily   #Tobacco Abuse:  Continues to smoke about 1/2 pack per day. -Encourage complete cessation -Encouraged nicotine patches -Needs yearly CT screening' last 06/2021 without nodules  #Mild Carotid Artery Disease: 1-39% bilaterally in 2022. -Repeat carotid ultrasiund in 2025 -Continue ASA, zetia and lipitor as above   #Obesity: BMI down to 28! Tolerating wegovy. -Continue wegovy -Continue healthy diet and weight loss efforts as detailed below   Exercise recommendations: Goal of exercising for at least 30 minutes a day, at least 5 times per week.  Please exercise to a moderate exertion.  This means that while exercising it is difficult to speak in full sentences, however you are not so short of breath that you feel you must stop, and not so comfortable that you can carry on a full conversation.  Exertion level should be approximately a 5/10, if 10 is the most exertion you  can perform.   Diet recommendations: Recommend a heart healthy diet such as the Mediterranean diet.  This diet consists of plant based foods, healthy fats, lean meats, olive oil.  It suggests limiting the  intake of simple carbohydrates such as white breads, pastries, and pastas.  It also limits the amount of red meat, wine, and dairy products such as cheese that one should consume on a daily basis.     Medication Adjustments/Labs and Tests Ordered: Current medicines are reviewed at length with the patient today.  Concerns regarding medicines are outlined above.  No orders of the defined types were placed in this encounter.  No orders of the defined types were placed in this encounter.    There are no Patient Instructions on file for this visit.   Signed, Meriam Sprague, MD  02/16/2022 1:06 PM    [facility]

## 2022-02-17 ENCOUNTER — Ambulatory Visit: Payer: BLUE CROSS/BLUE SHIELD | Attending: Cardiology | Admitting: Cardiology

## 2022-02-17 ENCOUNTER — Encounter: Payer: Self-pay | Admitting: Cardiology

## 2022-02-17 VITALS — BP 104/82 | HR 62 | Ht 71.0 in | Wt 210.0 lb

## 2022-02-17 DIAGNOSIS — I2510 Atherosclerotic heart disease of native coronary artery without angina pectoris: Secondary | ICD-10-CM

## 2022-02-17 DIAGNOSIS — Z720 Tobacco use: Secondary | ICD-10-CM | POA: Diagnosis not present

## 2022-02-17 DIAGNOSIS — I4891 Unspecified atrial fibrillation: Secondary | ICD-10-CM

## 2022-02-17 DIAGNOSIS — D6869 Other thrombophilia: Secondary | ICD-10-CM

## 2022-02-17 DIAGNOSIS — I10 Essential (primary) hypertension: Secondary | ICD-10-CM

## 2022-02-17 DIAGNOSIS — Z79899 Other long term (current) drug therapy: Secondary | ICD-10-CM

## 2022-02-17 DIAGNOSIS — Z951 Presence of aortocoronary bypass graft: Secondary | ICD-10-CM

## 2022-02-17 DIAGNOSIS — E782 Mixed hyperlipidemia: Secondary | ICD-10-CM

## 2022-02-17 DIAGNOSIS — I779 Disorder of arteries and arterioles, unspecified: Secondary | ICD-10-CM | POA: Diagnosis not present

## 2022-02-17 DIAGNOSIS — E785 Hyperlipidemia, unspecified: Secondary | ICD-10-CM

## 2022-02-17 MED ORDER — AMLODIPINE BESYLATE 5 MG PO TABS: 5.0000 mg | ORAL_TABLET | Freq: Every day | ORAL | 2 refills | Status: DC

## 2022-02-17 MED ORDER — APIXABAN 5 MG PO TABS: 5.0000 mg | ORAL_TABLET | Freq: Two times a day (BID) | ORAL | 5 refills | Status: DC

## 2022-02-17 NOTE — Progress Notes (Signed)
Cardiology Office Note:    Date:  02/17/2022   ID:  [name], DOB 28-Oct-1965, MRN [id number]  PCP:  Dorna Mai, MD   [facility] Providers Cardiologist:  Ena Dawley, MD {   Referring MD: Dorna Mai, MD    History of Present Illness:    [name] is a 56 y.o. male with a hx of CAD s/p CABG x 4 04/18/2015 by Dr. Servando Snare (LIMA to LAD, Left radial artery to OM, SVG to Intermediate, and SVG to PD), post operative atrial fibrillation managed with amiodarone, HTN, depression and anxiety who was previously followed by Dr. Meda Coffee who now returns to clinic for follow-up.   Patient has history of CAD s/p CABG in 2017 as detailed above.  1 month after his CABG, he was readmitted 04/2015 for recurrent CP and hypertensive urgency. Enzyme elevation was c/w NSTEMI. Subsequently, he underwent repeat LHC which demonstrated occlusion of the SVG to the ramus intermedius. The native vessel had moderate ostial stenosis but with TIMI 3 flow without other obstructive disease. It is possible that with occlusion of the SVG there may have been some thrombus showered downstream. His remaining 3 grafts were patent. Medical management was recommended. He was continued on ASA, a statin, BB and ARB. Plavix and a long acting nitrate were added to his regimen. He was also prescribed PRN SL NTG. He did well with medical therapy.  Patient was last seen in clinic on 07/07/20 where he was having more dyspnea on exertion and intermittent exertional chest pain. Myoview 07/16/20 with no evidence of ischemia or infarction. EF 47%. Lung cancer screening CT with no nodules, underlying CAD and aortic sclerosis and COPD.  Last seen in clinic in 05/2021 where he was doing well from a CV perspective. Continued to smoke.  Today, he states that he has been doing well. We discussed that he has returned into A-fib today. He previously went into A-fib postoperatively following his CABG. He denies any palpitations,  chest pain, or shortness of breath. We thoroughly discussed the risks for stroke and need to start anticoagulation. He is agreeable to 3 weeks of Eliquis 56m BID before following up in A-fib clinic with further consideration of cardioversion at that time. We reviewed the risks of Eliquis in combination with Plavix and have agreed to discontinue the Plavix and Aspirin, particularly as he is already experiencing significant easy bruisability.  [facility]. Since losing the weight he has noticed intermittent lightheadedness when his blood pressure is too low. The lightheadedness occurs 80% of the time in the mornings after taking his Amlodipine. He is agreeable to reducing Amlodipine dose to 547mdaily.  He continues to smoke and is not interested in cutting back at this time. I again encouraged smoking cessation.  He denies any chest pain, SOB, LE edema, palpitations, orthopnea or PND.  Past Medical History:  Diagnosis Date   Anxiety    Depression    Hyperlipidemia    Hypertension    Lumbar herniated disc dx'd 03/2015   NSTEMI (non-ST elevated myocardial infarction) ([place]/2017    Past Surgical History:  Procedure Laterality Date   BACK SURGERY     CARDIAC CATHETERIZATION  04/15/2015   CARDIAC CATHETERIZATION [name] 04/15/2015   Procedure: Left Heart Cath and Coronary Angiography;  Surgeon: JoLorretta HarpMD;  [facility];  Service: Cardiovascular;  Laterality: [name];   CARDIAC CATHETERIZATION [name] 05/20/2015   Procedure: Left Heart Cath and  Cors/Grafts Angiography;  Surgeon: Peter M Martinique, MD;  Location: [place] [facility];  Service: Cardiovascular;  Laterality: [name];   CORONARY ARTERY BYPASS GRAFT [name] 04/18/2015   Procedure: CORONARY ARTERY BYPASS GRAFTING times four with LIMA  to LAD, LEFT RADIAL ARTERY to OM, Right saphenous vein harvesting and utilized to Interm and to PD [place] RIGHT THIGH;   Surgeon: Grace Isaac, MD;  Location: [place];  Service: Open Heart Surgery;  Laterality: [name];   LUMBAR LAMINECTOMY  2002   RADIAL ARTERY HARVEST Left 04/18/2015   Procedure:  LEFT Arm RADIAL ARTERY HARVEST;  Surgeon: Grace Isaac, MD;  Location: [place];  Service: Open Heart Surgery;  Laterality: Left;   TEE WITHOUT CARDIOVERSION [name] 04/18/2015   Procedure: TRANSESOPHAGEAL ECHOCARDIOGRAM (TEE);  Surgeon: Grace Isaac, MD;  Location: [place];  Service: Open Heart Surgery;  Laterality: [name];   TONSILLECTOMY  1960s    Current Medications: Current Meds  Medication Sig   amLODipine (NORVASC) 5 MG tablet Take 1 tablet (5 mg total) by mouth daily.   apixaban (ELIQUIS) 5 MG TABS tablet Take 1 tablet (5 mg total) by mouth 2 (two) times daily.   ezetimibe (ZETIA) 10 MG tablet Take 1 tablet (10 mg total) by mouth daily.   fenofibrate (TRICOR) 145 MG tablet Take 1 tablet (145 mg total) by mouth daily.   finasteride (PROSCAR) 5 MG tablet TAKE 1 TABLET BY MOUTH EVERY DAY   FLUoxetine (PROZAC) 10 MG tablet Take 3 tablets (30 mg total) by mouth daily.   hydrochlorothiazide (HYDRODIURIL) 25 MG tablet Take 1 tablet (25 mg total) by mouth daily.   icosapent Ethyl (VASCEPA) 1 g capsule Take 2 capsules (2 g total) by mouth 2 (two) times daily.   isosorbide mononitrate (IMDUR) 60 MG 24 hr tablet Take 1.5 tablets (90 mg total) by mouth daily.   levocetirizine (XYZAL) 5 MG tablet Take 5 mg by mouth every evening.   lisinopril (ZESTRIL) 40 MG tablet Take 1 tablet (40 mg total) by mouth daily.   metoprolol succinate (TOPROL-XL) 50 MG 24 hr tablet Take 1 tablet (50 mg total) by mouth daily. Take with or immediately following a meal.   nitroGLYCERIN (NITROSTAT) 0.4 MG SL tablet Place 1 tablet (0.4 mg total) under the tongue every 5 (five) minutes x 3 doses as needed for chest pain.   Omega-3 Fatty Acids (FISH OIL) 1000 MG CAPS 1 capsule   pantoprazole (PROTONIX) 40 MG tablet Take 1 tablet (40 mg total) by mouth  daily.   Semaglutide-Weight Management 2.4 MG/0.75ML SOAJ Inject 2.4 mg into the skin once a week.   silodosin (RAPAFLO) 8 MG CAPS capsule Take 8 mg by mouth daily.   spironolactone (ALDACTONE) 25 MG tablet Take 1 tablet (25 mg total) by mouth daily.   [DISCONTINUED] amLODipine (NORVASC) 10 MG tablet Take 1 tablet (10 mg total) by mouth daily.   [DISCONTINUED] aspirin EC 81 MG EC tablet Take 1 tablet (81 mg total) by mouth daily.   [DISCONTINUED] clopidogrel (PLAVIX) 75 MG tablet Take 1 tablet (75 mg total) by mouth daily.     Allergies:   Atorvastatin, Claritin [loratadine], Codeine, and Vicodin [hydrocodone-acetaminophen]   Social History   Socioeconomic History   Marital status: Married    Spouse name: Not on file   Number of children: Not on file   Years of education: Not on file   Highest education level: Not on file  Occupational History  Not on file  Tobacco Use   Smoking status: Some Days    Packs/day: 1.50    Years: 33.00    Total pack years: 49.50    Types: Cigarettes    Last attempt to quit: 04/15/2015    Years since quitting: 6.8   Smokeless tobacco: Never  Vaping Use   Vaping Use: Never used  Substance and Sexual Activity   Alcohol use: Yes    Alcohol/week: 0.0 standard drinks of alcohol    Comment: occasionally   Drug use: Not Currently    Types: Marijuana, Cocaine    Comment: "in my teens"   Sexual activity: Not on file  Other Topics Concern   Not on file  Social History Narrative   Not on file   Social Determinants of Health   Financial Resource Strain: Not on file  Food Insecurity: No Food Insecurity (10/28/2021)   Hunger Vital Sign    Worried About Running Out of Food in the Last Year: Never true    Ran Out of Food in the Last Year: Never true  Transportation Needs: No Transportation Needs (10/28/2021)   PRAPARE - Hydrologist (Medical): No    Lack of Transportation (Non-Medical): No  Physical Activity: Not on file   Stress: Not on file  Social Connections: Not on file     Family History: The patient's family history includes Cancer in his father; Diabetes in his sister; Heart attack (age of onset: 90) in his father.  ROS:   Please see the history of present illness.    Review of Systems  Constitutional:  Negative for chills and fever.  HENT:  Negative for nosebleeds and tinnitus.   Eyes:  Negative for blurred vision and pain.  Respiratory:  Negative for cough, hemoptysis, shortness of breath and stridor.   Cardiovascular:  Negative for chest pain, palpitations, orthopnea, claudication, leg swelling and PND.  Gastrointestinal:  Negative for blood in stool, diarrhea, nausea and vomiting.  Genitourinary:  Negative for dysuria and hematuria.  Musculoskeletal:  Negative for falls.  Neurological:  Negative for dizziness, loss of consciousness and headaches.       + lightheadedness  Endo/Heme/Allergies:  Bruises/bleeds easily.  Psychiatric/Behavioral:  Negative for depression, hallucinations and substance abuse. The patient does not have insomnia.      EKGs/Labs/Other Studies Reviewed:    The following studies were reviewed today: Carotid Arterial Duplex 10/22/20 Right Carotid: Velocities in the right ICA are consistent with a 1-39%  stenosis. Decrease ICA velocities compared to prior exam.   Left Carotid: Velocities in the left ICA are consistent with a 1-39%  stenosis. Decrease ICA velocities compared to prior exam.   Vertebrals:  Left vertebral artery demonstrates antegrade flow. Griffin  caliber right vertebral artery with atypical antegrade flow.   Subclavians: Normal flow hemodynamics were seen in bilateral subclavian arteries.   Myoview 07/16/20: Nuclear stress EF: 47%. There was no ST segment deviation noted during stress. The study is normal. This is a low risk study.   Low risk stress nuclear study with normal perfusion and mildly reduced global systolic function. Consider echo  correlation.  TTE 07/18/19:  1. Inferior basal hypokinesis . Left ventricular ejection fraction, by  estimation, is 60%. The left ventricle has normal function. The left  ventricle has no regional wall motion abnormalities. Left ventricular  diastolic parameters were normal.   2. Right ventricular systolic function is normal. The right ventricular  size is normal.   3. Left  atrial size was moderately dilated.   4. Right atrial size was moderately dilated.   5. The mitral valve is normal in structure. No evidence of mitral valve  regurgitation. No evidence of mitral stenosis.   6. The aortic valve is normal in structure. Aortic valve regurgitation is  not visualized. No aortic stenosis is present.   7. The inferior vena cava is normal in size with greater than 50%  respiratory variability, suggesting right atrial pressure of 3 mmHg.  CT lung caner screen 07/16/20: IMPRESSION: 1. Lung-RADS 2, benign appearance or behavior. Continue annual screening with low-dose chest CT without contrast in 12 months. 2. There is aortic atherosclerosis, in addition to left main and 3 vessel coronary artery disease. Status post median sternotomy for CABG including LIMA to the LAD. 3. Mild diffuse bronchial wall thickening with very mild centrilobular and paraseptal emphysema; imaging findings suggestive of underlying COPD.   Aortic Atherosclerosis (ICD10-I70.0) and Emphysema (ICD10-J43.9).   TTE: 04/2015 - Left ventricle: The cavity size was normal. Wall thickness was   increased in a pattern of mild LVH. Basal inferior severe   hypokinesis, inferolateral hypokinesis. Systolic function was low   normal to mildly reduced. The estimated ejection fraction was in   the range of 50% to 55%. Doppler parameters are consistent with   abnormal left ventricular relaxation (grade 1 diastolic   dysfunction). - Aortic valve: There was no stenosis. - Ascending aorta: The ascending aorta was mildly dilated at  3.7   cm. - Mitral valve: There was no significant regurgitation. - Left atrium: The atrium was mildly dilated. - Right ventricle: Poorly visualized. - Pulmonary arteries: No complete TR doppler jet so unable to   estimate PA systolic pressure. - Inferior vena cava: The vessel was normal in size. The   respirophasic diameter changes were in the normal range (>= 50%),   consistent with normal central venous pressure. - Pericardium, extracardiac: A trivial pericardial effusion was   identified.   Cath 2017: Mid Cx lesion, 95% stenosed. Mid Cx to Dist Cx lesion, 95% stenosed. Prox LAD to Mid LAD lesion, 50% stenosed. Ost Ramus lesion, 60% stenosed. Prox Cx lesion, 95% stenosed. Prox RCA-1 lesion, 75% stenosed. Prox RCA-2 lesion, 80% stenosed. Mid RCA lesion, 90% stenosed. Dist RCA lesion, 60% stenosed. RPDA lesion, 60% stenosed. SVG to the PDA was injected is very large, and is anatomically normal. There is competitive flow. SVG to the ramus intermediate was injected is very large. Mid Graft to Insertion lesion, 100% stenosed. Radial artery graft to the OM was injected is normal in caliber, and is anatomically normal. There is competitive flow. LIMA to the LAD was injected is normal in caliber, and is anatomically normal. The left ventricular systolic function is normal.   1. Severe 3 vessel obstructive CAD 2. Patent LIMA to the LAD 3. Patent free radial graft to the OM. This arises from the hood of the SVG to the ramus intermediate branch 4. Occluded mid SVG to the ramus intermediate with thrombus. 5. Patent SVG to the PDA 6. Good LV function.   Plan: It appears that the culprit lesion is occlusion of the SVG to the ramus intermediate. The native vessel does have moderate ostial stenosis but has TIMI 3 flow without other obstructive disease. It is possible that with occlusion of the SVG there may have been some thrombus showered downstream. At this point would treat medically.    EKG:  EKG ordered today, 02/17/22.  The ekg ordered today demonstrates  A-fib rate of 62bpm.   Recent Labs: 03/02/2021: BUN 17; Creatinine, Ser 0.98; Potassium 3.7; Sodium 140  Recent Lipid Panel    Component Value Date/Time   CHOL 115 05/20/2021 0914   TRIG 67 05/20/2021 0914   HDL 43 05/20/2021 0914   CHOLHDL 2.7 05/20/2021 0914   CHOLHDL 3.8 05/20/2015 0514   VLDL 22 05/20/2015 0514   LDLCALC 58 05/20/2021 0914         Physical Exam:    VS:  BP 104/82   Pulse 62   Ht _0  (1.803 m)   Wt 210 lb (95.3 kg)   SpO2 98%   BMI 29.29 kg/m     Wt Readings from Last 3 Encounters:  02/17/22 210 lb (95.3 kg)  01/29/22 206 lb 12.8 oz (93.8 kg)  07/30/21 199 lb (90.3 kg)     GEN:  Well nourished, well developed in no acute distress HEENT: Normal NECK: No JVD; No carotid bruits CARDIAC: Irregularly irregular, no murmurs, rubs or gallops RESPIRATORY:  Clear to auscultation without rales, wheezing or rhonchi  ABDOMEN: Soft, non-tender, non-distended MUSCULOSKELETAL:  No edema; No deformity  SKIN: Warm and dry NEUROLOGIC:  Alert and oriented x 3 PSYCHIATRIC:  Normal affect   ASSESSMENT:    1. New onset a-fib ([place])   2. Coronary artery disease involving native coronary artery of native heart without angina pectoris   3. Tobacco abuse   4. Bilateral carotid artery disease, unspecified type ([place])   5. S/P CABG x 4   6. Hyperlipidemia, unspecified hyperlipidemia type   7. Essential (primary) hypertension   8. Elevated triglycerides with high cholesterol   9. Medication management   10. Secondary hypercoagulable state ([place])      PLAN:    In order of problems listed above:  #Paroxysmal Afib: CHADs-vasc 2. Had history of post-op Afib and is incidentally noted to be in Afib today on ECG. He is asymptomatic and well rate controlled on metoprolol. Will start apixaban 29m BID and arrange for follow-up in Afib clinic in 3 weeks. Can trial DCCV if remains out of rhythm. Will  update TTE but had moderate biatrial enlargement on echo in 2021. -Start apixaban 566mBID -Continue metop 5044mL daily -Follow-up Afib clinic in 3 weeks and if remains out of rhythm, can plan for DCCV  -Update TTE -Given moderate biatrial enlargement on prior TTE, he may have difficulty maintaining rhythm   #Multivessel CAD s/p CABG x4 in 2016: Patient with NSTEMI in 03/2014 treated with CABG x 4 as detailed above. He had a second NSTEMI 04/2015 with cath revealing a failed SVG to RI. His other 3 grafts were patent.  LVEF low normal in 2017 with mild regional wall motion abnormalities now with LVEF 60% on TTE 06/2019 with inferobasal hypokinesis. Recent myoview 06/2020 without evidence of ischemia or infarction. Currently doing well with no anginal symptoms. -Myoview 06/2020 negative for ischemia -Stop ASA/plavix given need for apixaban as no recent PCI -Continue lipitor 21m29mily and zetia 10mg39mly -Continue lisinopril 21mg 32my -Continue bystolic 10mg d38BO -Continue imdur 90mg d16m  #HTN: Now with low BP in the setting of weight loss.  -Continue lisinopril 21mg da36m-Continue bystolic 10mg [id number] amlodipine to 5mg dail77mcan stop if persistently low BP -Continue imdur 90mg dail65mMET today   #Mixed Hyperlipidemia: Well controlled in 05/2021. -Continue lipitor 21mg daily56m zetia 10mg daily 7meat lipids today   #Tobacco Abuse:  Continues to smoke about 1/2 pack per day. -  Encourage complete cessation -Encouraged nicotine patches -Needs yearly CT screening' last 06/2021 without nodules  #Mild Carotid Artery Disease: 1-39% bilaterally in 2022. -Repeat carotid ultrasiund in 2025 -Continue ASA, zetia and lipitor as above   #Obesity: BMI down to 29! Tolerating wegovy. -Continue wegovy -Continue healthy diet and weight loss efforts as detailed below   Exercise recommendations: Goal of exercising for at least 30 minutes a day, at least 5 times per week.  Please  exercise to a moderate exertion.  This means that while exercising it is difficult to speak in full sentences, however you are not so short of breath that you feel you must stop, and not so comfortable that you can carry on a full conversation.  Exertion level should be approximately a 5/10, if 10 is the most exertion you can perform.   Diet recommendations: Recommend a heart healthy diet such as the Mediterranean diet.  This diet consists of plant based foods, healthy fats, lean meats, olive oil.  It suggests limiting the intake of simple carbohydrates such as white breads, pastries, and pastas.  It also limits the amount of red meat, wine, and dairy products such as cheese that one should consume on a daily basis.     Medication Adjustments/Labs and Tests Ordered: Current medicines are reviewed at length with the patient today.  Concerns regarding medicines are outlined above.  Orders Placed This Encounter  Procedures   Lipid Profile   Comp Met (CMET)   HgB A1c   CBC w/Diff   Amb Referral to AFIB Clinic   EKG 12-Lead   ECHOCARDIOGRAM COMPLETE   Meds ordered this encounter  Medications   amLODipine (NORVASC) 5 MG tablet    Sig: Take 1 tablet (5 mg total) by mouth daily.    Dispense:  90 tablet    Refill:  2    Dose decreased   apixaban (ELIQUIS) 5 MG TABS tablet    Sig: Take 1 tablet (5 mg total) by mouth 2 (two) times daily.    Dispense:  60 tablet    Refill:  5     Patient Instructions  Medication Instructions:   STOP TAKING ASPIRIN NOW  STOP TAKING PLAVIX NOW  START TAKING ELIQUIS 5 MG BY MOUTH TWICE DAILY   DECREASE YOUR AMLODIPINE TO 5 MG BY MOUTH DAILY  *If you need a refill on your cardiac medications before your next appointment, please call your pharmacy*   You have been referred to OUR AFIB CLINIC TO BE SEEN IN 3 WEEKS TO DETERMINE IF YOU ARE STILL IN AFIB OR NOT--IF IN AFIB AT THAT VISIT, AFIB CLINIC TO ARRANGE CARDIOVERSION AT THAT TIME   Lab  Work:  TODAY--CMET, CBC W DIFF, LIPIDS, AND A1C  If you have labs (blood work) drawn today and your tests are completely normal, you will receive your results only by: [employer] (if you have MyChart) OR A paper copy in the mail If you have any lab test that is abnormal or we need to change your treatment, we will call you to review the results.   Testing/Procedures:  Your physician has requested that you have an echocardiogram. Echocardiography is a painless test that uses sound waves to create images of your heart. It provides your doctor with information about the size and shape of your heart and how well your heart's chambers and valves are working. This procedure takes approximately one hour. There are no restrictions for this procedure. Please do NOT wear cologne, perfume, aftershave, or  lotions (deodorant is allowed). Please arrive 15 minutes prior to your appointment time.    Follow-Up:  3 WEEKS WITH AFIB CLINIC PER DR. Johney Frame   At [facility], you and your health needs are our priority.  As part of our continuing mission to provide you with exceptional heart care, we have created designated Provider Care Teams.  These Care Teams include your primary Cardiologist (physician) and Advanced Practice Providers (APPs -  Physician Assistants and Nurse Practitioners) who all work together to provide you with the care you need, when you need it.  We recommend signing up for the patient portal called "MyChart".  Sign up information is provided on this After Visit Summary.  MyChart is used to connect with patients for Virtual Visits (Telemedicine).  Patients are able to view lab/test results, encounter notes, upcoming appointments, etc.  Non-urgent messages can be sent to your provider as well.   To learn more about what you can do with MyChart, go to [email or web address].    Your next appointment:   6 month(s)  The format for your next appointment:   In  Person  Provider:   DR. Johney Frame   Important Information About Sugar         I,Alexis Herring,acting as a scribe for Freada Bergeron, MD.,have documented all relevant documentation on the behalf of Freada Bergeron, MD,as directed by  Freada Bergeron, MD while in the presence of Freada Bergeron, MD.  I, Freada Bergeron, MD, have reviewed all documentation for this visit. The documentation on 02/17/22 for the exam, diagnosis, procedures, and orders are all accurate and complete.   Signed, Freada Bergeron, MD  02/17/2022 10:14 AM    [place]

## 2022-02-17 NOTE — Patient Instructions (Signed)
Medication Instructions:   STOP TAKING ASPIRIN NOW  STOP TAKING PLAVIX NOW  START TAKING ELIQUIS 5 MG BY MOUTH TWICE DAILY   DECREASE YOUR AMLODIPINE TO 5 MG BY MOUTH DAILY  *If you need a refill on your cardiac medications before your next appointment, please call your pharmacy*   You have been referred to OUR AFIB CLINIC TO BE SEEN IN 3 WEEKS TO DETERMINE IF YOU ARE STILL IN AFIB OR NOT--IF IN AFIB AT THAT VISIT, AFIB CLINIC TO ARRANGE CARDIOVERSION AT THAT TIME   Lab Work:  TODAY--CMET, CBC W DIFF, LIPIDS, AND A1C  If you have labs (blood work) drawn today and your tests are completely normal, you will receive your results only by: [employer] (if you have MyChart) OR A paper copy in the mail If you have any lab test that is abnormal or we need to change your treatment, we will call you to review the results.   Testing/Procedures:  Your physician has requested that you have an echocardiogram. Echocardiography is a painless test that uses sound waves to create images of your heart. It provides your doctor with information about the size and shape of your heart and how well your heart's chambers and valves are working. This procedure takes approximately one hour. There are no restrictions for this procedure. Please do NOT wear cologne, perfume, aftershave, or lotions (deodorant is allowed). Please arrive 15 minutes prior to your appointment time.    Follow-Up:  3 WEEKS WITH AFIB CLINIC PER DR. Shari Prows   At [facility], you and your health needs are our priority.  As part of our continuing mission to provide you with exceptional heart care, we have created designated Provider Care Teams.  These Care Teams include your primary Cardiologist (physician) and Advanced Practice Providers (APPs -  Physician Assistants and Nurse Practitioners) who all work together to provide you with the care you need, when you need it.  We recommend signing up for the patient portal  called "MyChart".  Sign up information is provided on this After Visit Summary.  MyChart is used to connect with patients for Virtual Visits (Telemedicine).  Patients are able to view lab/test results, encounter notes, upcoming appointments, etc.  Non-urgent messages can be sent to your provider as well.   To learn more about what you can do with MyChart, go to [email or web address].    Your next appointment:   6 month(s)  The format for your next appointment:   In Person  Provider:   DR. Shari Prows   Important Information About Sugar

## 2022-02-18 LAB — CBC WITH DIFFERENTIAL/PLATELET
Basophils Absolute: 0 10*3/uL (ref 0.0–0.2)
Basos: 1 %
EOS (ABSOLUTE): 0.2 10*3/uL (ref 0.0–0.4)
Eos: 3 %
Hematocrit: 46.3 % (ref 37.5–51.0)
Hemoglobin: 15.7 g/dL (ref 13.0–17.7)
Immature Grans (Abs): 0 10*3/uL (ref 0.0–0.1)
Immature Granulocytes: 0 %
Lymphocytes Absolute: 1.8 10*3/uL (ref 0.7–3.1)
Lymphs: 24 %
MCH: 30.8 pg (ref 26.6–33.0)
MCHC: 33.9 g/dL (ref 31.5–35.7)
MCV: 91 fL (ref 79–97)
Monocytes Absolute: 0.6 10*3/uL (ref 0.1–0.9)
Monocytes: 9 %
Neutrophils Absolute: 4.7 10*3/uL (ref 1.4–7.0)
Neutrophils: 63 %
Platelets: 177 10*3/uL (ref 150–450)
RBC: 5.1 x10E6/uL (ref 4.14–5.80)
RDW: 13.4 % (ref 11.6–15.4)
WBC: 7.3 10*3/uL (ref 3.4–10.8)

## 2022-02-18 LAB — COMPREHENSIVE METABOLIC PANEL
ALT: 13 IU/L (ref 0–44)
AST: 16 IU/L (ref 0–40)
Albumin/Globulin Ratio: 1.8 (ref 1.2–2.2)
Albumin: 4.6 g/dL (ref 3.8–4.9)
Alkaline Phosphatase: 58 IU/L (ref 44–121)
BUN/Creatinine Ratio: 16 (ref 9–20)
BUN: 15 mg/dL (ref 6–24)
Bilirubin Total: 0.8 mg/dL (ref 0.0–1.2)
CO2: 24 mmol/L (ref 20–29)
Calcium: 9.1 mg/dL (ref 8.7–10.2)
Chloride: 101 mmol/L (ref 96–106)
Creatinine, Ser: 0.91 mg/dL (ref 0.76–1.27)
Globulin, Total: 2.5 g/dL (ref 1.5–4.5)
Glucose: 93 mg/dL (ref 70–99)
Potassium: 3.9 mmol/L (ref 3.5–5.2)
Sodium: 137 mmol/L (ref 134–144)
Total Protein: 7.1 g/dL (ref 6.0–8.5)
eGFR: 98 mL/min/{1.73_m2} (ref >59)

## 2022-02-18 LAB — LIPID PANEL
Chol/HDL Ratio: 2.5 ratio (ref 0.0–5.0)
Cholesterol, Total: 113 mg/dL (ref 100–199)
HDL: 45 mg/dL (ref >39)
LDL Chol Calc (NIH): 54 mg/dL (ref 0–99)
Triglycerides: 66 mg/dL (ref 0–149)
VLDL Cholesterol Cal: 14 mg/dL (ref 5–40)

## 2022-02-18 LAB — HEMOGLOBIN A1C
Est. average glucose Bld gHb Est-mCnc: 108 mg/dL
Hgb A1c MFr Bld: 5.4 % (ref 4.8–5.6)

## 2022-02-22 ENCOUNTER — Other Ambulatory Visit: Payer: Self-pay

## 2022-02-22 MED ORDER — EZETIMIBE 10 MG PO TABS: 10.0000 mg | ORAL_TABLET | Freq: Every day | ORAL | 1 refills | Status: DC

## 2022-03-08 ENCOUNTER — Ambulatory Visit (HOSPITAL_COMMUNITY): Payer: BLUE CROSS/BLUE SHIELD | Attending: Cardiology

## 2022-03-08 DIAGNOSIS — I2510 Atherosclerotic heart disease of native coronary artery without angina pectoris: Secondary | ICD-10-CM | POA: Insufficient documentation

## 2022-03-08 DIAGNOSIS — I4891 Unspecified atrial fibrillation: Secondary | ICD-10-CM | POA: Insufficient documentation

## 2022-03-08 DIAGNOSIS — Z951 Presence of aortocoronary bypass graft: Secondary | ICD-10-CM | POA: Diagnosis not present

## 2022-03-08 LAB — ECHOCARDIOGRAM COMPLETE: S' Lateral: 3.4 cm

## 2022-03-10 ENCOUNTER — Ambulatory Visit (HOSPITAL_COMMUNITY)
Admission: RE | Admit: 2022-03-10 | Discharge: 2022-03-10 | Disposition: A | Discharge status: Home / Self Care | Payer: BLUE CROSS/BLUE SHIELD | Source: Ambulatory Visit | Attending: Physician Assistant | Admitting: Physician Assistant

## 2022-03-10 ENCOUNTER — Encounter: Payer: Self-pay | Admitting: Cardiology

## 2022-03-10 VITALS — BP 146/106 | HR 66 | Ht 71.0 in | Wt 208.8 lb

## 2022-03-10 DIAGNOSIS — Z7901 Long term (current) use of anticoagulants: Secondary | ICD-10-CM | POA: Insufficient documentation

## 2022-03-10 DIAGNOSIS — I10 Essential (primary) hypertension: Secondary | ICD-10-CM | POA: Diagnosis not present

## 2022-03-10 DIAGNOSIS — I4819 Other persistent atrial fibrillation: Secondary | ICD-10-CM | POA: Insufficient documentation

## 2022-03-10 DIAGNOSIS — Z951 Presence of aortocoronary bypass graft: Secondary | ICD-10-CM | POA: Diagnosis not present

## 2022-03-10 DIAGNOSIS — E785 Hyperlipidemia, unspecified: Secondary | ICD-10-CM | POA: Diagnosis not present

## 2022-03-10 DIAGNOSIS — Z79899 Other long term (current) drug therapy: Secondary | ICD-10-CM | POA: Diagnosis not present

## 2022-03-10 DIAGNOSIS — D6869 Other thrombophilia: Secondary | ICD-10-CM | POA: Insufficient documentation

## 2022-03-10 DIAGNOSIS — F17210 Nicotine dependence, cigarettes, uncomplicated: Secondary | ICD-10-CM | POA: Insufficient documentation

## 2022-03-10 DIAGNOSIS — I2510 Atherosclerotic heart disease of native coronary artery without angina pectoris: Secondary | ICD-10-CM | POA: Insufficient documentation

## 2022-03-10 DIAGNOSIS — R519 Headache, unspecified: Secondary | ICD-10-CM | POA: Insufficient documentation

## 2022-03-10 MED ORDER — AMLODIPINE BESYLATE 10 MG PO TABS: 10.0000 mg | ORAL_TABLET | Freq: Every day | ORAL | 1 refills | Status: DC

## 2022-03-10 NOTE — H&P (View-Only) (Signed)
  Primary Care Physician: Wilson, Amelia, MD Primary Cardiologist: Dr Pemberton Primary Electrophysiologist: none Referring Physician: Dr Pemberton    [name] is a 56 y.o. male with a history of CAD s/p CABG 2017, tobacco abuse, HTN, HLD, atrial fibrillation who presents for consultation in the [facility].  The patient was initially diagnosed with atrial fibrillation in 2017 postoperatively following his CABG. He was seen by Dr Pemberton 02/17/22 and was found to be back in afib at that visit. He was started on Eliquis for a CHADS2VASC score of 2. His amlodipine was also decreased due to low BP and dizziness. Patient reports that he has had more headaches recently but is not very aware of his arrhythmia. No bleeding issues on anticoagulation. He denies significant alcohol use, snores intermittently.   Today, he denies symptoms of palpitations, chest pain, shortness of breath, orthopnea, PND, lower extremity edema, dizziness, presyncope, syncope, daytime somnolence, bleeding, or neurologic sequela. The patient is tolerating medications without difficulties and is otherwise without complaint today.    Atrial Fibrillation Risk Factors:  he does have symptoms or diagnosis of sleep apnea. he does not have a history of rheumatic fever. he does not have a history of alcohol use. The patient does not have a history of early familial atrial fibrillation or other arrhythmias.  he has a BMI of Body mass index is 29.12 kg/m.. Filed Weights   03/10/22 0914  Weight: 94.7 kg    Family History  Problem Relation Age of Onset   Heart attack Father 51       Deceased at this age   Cancer Father        Lung   Diabetes Sister      Atrial Fibrillation Management history:  Previous antiarrhythmic drugs: amiodarone (post op) Previous cardioversions: none Previous ablations: none CHADS2VASC score: 2 Anticoagulation history: Eliquis   Past Medical History:   Diagnosis Date   Anxiety    Depression    Hyperlipidemia    Hypertension    Lumbar herniated disc dx'd 03/2015   NSTEMI (non-ST elevated myocardial infarction) (HCC) 04/15/2015   Past Surgical History:  Procedure Laterality Date   BACK SURGERY     CARDIAC CATHETERIZATION  04/15/2015   CARDIAC CATHETERIZATION [name] 04/15/2015   Procedure: Left Heart Cath and Coronary Angiography;  Surgeon: Jonathan J Berry, MD;  Location: [facility];  Service: Cardiovascular;  Laterality: [name];   CARDIAC CATHETERIZATION [name] 05/20/2015   Procedure: Left Heart Cath and Cors/Grafts Angiography;  Surgeon: Peter M Jordan, MD;  Location: [facility];  Service: Cardiovascular;  Laterality: [name];   CORONARY ARTERY BYPASS GRAFT [name] 04/18/2015   Procedure: CORONARY ARTERY BYPASS GRAFTING times four with LIMA  to LAD, LEFT RADIAL ARTERY to OM, Right saphenous vein harvesting and utilized to Interm and to PD ENDOSCOPIC GREATER SAPHENOUS VEIN HARVEST (EVH) RIGHT THIGH;  Surgeon: Edward B Gerhardt, MD;  Location: [facility] OR;  Service: Open Heart Surgery;  Laterality: [name];   LUMBAR LAMINECTOMY  2002   RADIAL ARTERY HARVEST Left 04/18/2015   Procedure:  LEFT Arm RADIAL ARTERY HARVEST;  Surgeon: Edward B Gerhardt, MD;  Location: [facility] OR;  Service: Open Heart Surgery;  Laterality: Left;   TEE WITHOUT CARDIOVERSION [name] 04/18/2015   Procedure: TRANSESOPHAGEAL ECHOCARDIOGRAM (TEE);  Surgeon: Edward B Gerhardt, MD;  Location: [facility] OR;  Service: Open Heart Surgery;  Laterality: [name];   TONSILLECTOMY  1960s    Current Outpatient Medications  Medication Sig Dispense Refill     amLODipine (NORVASC) 5 MG tablet Take 1 tablet (5 mg total) by mouth daily. 90 tablet 2   apixaban (ELIQUIS) 5 MG TABS tablet Take 1 tablet (5 mg total) by mouth 2 (two) times daily. 60 tablet 5   atorvastatin (LIPITOR) 40 MG tablet Take 40 mg by mouth daily.     ezetimibe (ZETIA) 10 MG tablet Take 1 tablet (10 mg total) by mouth daily. 90 tablet 1    fenofibrate (TRICOR) 145 MG tablet Take 1 tablet (145 mg total) by mouth daily. 90 tablet 2   finasteride (PROSCAR) 5 MG tablet TAKE 1 TABLET BY MOUTH EVERY DAY 90 tablet 3   FLUoxetine (PROZAC) 10 MG tablet Take 3 tablets (30 mg total) by mouth daily. 270 tablet 1   hydrochlorothiazide (HYDRODIURIL) 25 MG tablet Take 1 tablet (25 mg total) by mouth daily. 90 tablet 2   icosapent Ethyl (VASCEPA) 1 g capsule Take 2 capsules (2 g total) by mouth 2 (two) times daily. 360 capsule 2   isosorbide mononitrate (IMDUR) 60 MG 24 hr tablet Take 1.5 tablets (90 mg total) by mouth daily. 135 tablet 3   levocetirizine (XYZAL) 5 MG tablet Take 5 mg by mouth every evening.     lisinopril (ZESTRIL) 40 MG tablet Take 1 tablet (40 mg total) by mouth daily. 90 tablet 3   metoprolol succinate (TOPROL-XL) 50 MG 24 hr tablet Take 1 tablet (50 mg total) by mouth daily. Take with or immediately following a meal. 90 tablet 3   nitroGLYCERIN (NITROSTAT) 0.4 MG SL tablet Place 1 tablet (0.4 mg total) under the tongue every 5 (five) minutes x 3 doses as needed for chest pain. 25 tablet 2   pantoprazole (PROTONIX) 40 MG tablet Take 1 tablet (40 mg total) by mouth daily. 90 tablet 1   Semaglutide-Weight Management 2.4 MG/0.75ML SOAJ Inject 2.4 mg into the skin once a week. 3 mL 6   silodosin (RAPAFLO) 8 MG CAPS capsule Take 8 mg by mouth daily.     spironolactone (ALDACTONE) 25 MG tablet Take 1 tablet (25 mg total) by mouth daily. 90 tablet 3   No current facility-administered medications for this encounter.    Allergies  Allergen Reactions   Atorvastatin Other (See Comments)    80 mg dose legs cramps, tolerates 40 mg dose   Claritin [Loratadine] Other (See Comments)    Makes allergies worse-"a lot worse"   Codeine Nausea And Vomiting   Vicodin [Hydrocodone-Acetaminophen] Nausea And Vomiting    "pretty much any codeine"    Social History   Socioeconomic History   Marital status: Married    Spouse name: Not on file    Number of children: Not on file   Years of education: Not on file   Highest education level: Not on file  Occupational History   Not on file  Tobacco Use   Smoking status: Some Days    Packs/day: 1.50    Years: 33.00    Total pack years: 49.50    Types: Cigarettes    Last attempt to quit: 04/15/2015    Years since quitting: 6.9   Smokeless tobacco: Never  Vaping Use   Vaping Use: Never used  Substance and Sexual Activity   Alcohol use: Yes    Alcohol/week: 0.0 standard drinks of alcohol    Comment: occasionally   Drug use: Not Currently    Types: Marijuana, Cocaine    Comment: "in my teens"   Sexual activity: Not on file  Other Topics  Concern   Not on file  Social History Narrative   Not on file   Social Determinants of Health   Financial Resource Strain: Not on file  Food Insecurity: No Food Insecurity (10/28/2021)   Hunger Vital Sign    Worried About Running Out of Food in the Last Year: Never true    Ran Out of Food in the Last Year: Never true  Transportation Needs: No Transportation Needs (10/28/2021)   PRAPARE - Hydrologist (Medical): No    Lack of Transportation (Non-Medical): No  Physical Activity: Not on file  Stress: Not on file  Social Connections: Not on file  Intimate Partner Violence: Not on file     ROS- All systems are reviewed and negative except as per the HPI above.  Physical Exam: Vitals:   03/10/22 0914  BP: (!) 146/106  Pulse: 66  Weight: 94.7 kg  Height: 5\' 11"  (1.803 m)    GEN- The patient is a well appearing male, alert and oriented x 3 today.   Head- normocephalic, atraumatic Eyes-  Sclera clear, conjunctiva pink Ears- hearing intact Oropharynx- clear Neck- supple  Lungs- Clear to ausculation bilaterally, normal work of breathing Heart- irregular rate and rhythm, no murmurs, rubs or gallops  GI- soft, NT, ND, + BS Extremities- no clubbing, cyanosis, or edema MS- no significant deformity or  atrophy Skin- no rash or lesion Psych- euthymic mood, full affect Neuro- strength and sensation are intact  Wt Readings from Last 3 Encounters:  03/10/22 94.7 kg  02/17/22 95.3 kg  01/29/22 93.8 kg    EKG today demonstrates  Afib Vent. rate 66 BPM PR interval * ms QRS duration 94 ms QT/QTcB 414/434 ms  Echo 03/08/22 demonstrated   1. Left ventricular ejection fraction, by estimation, is 60 to 65%. The  left ventricle has normal function. The left ventricle has no regional  wall motion abnormalities. Left ventricular diastolic parameters are  indeterminate.   2. Right ventricular systolic function is normal. The right ventricular  size is normal.   3. Left atrial size was mildly dilated.   4. Right atrial size was mildly dilated.   5. The mitral valve is normal in structure. Mild mitral valve  regurgitation.   6. The aortic valve is tricuspid. Aortic valve regurgitation is not  visualized. Aortic valve sclerosis/calcification is present, without any  evidence of aortic stenosis.   7. The inferior vena cava is dilated in size with <50% respiratory  variability, suggesting right atrial pressure of 15 mmHg.   Epic records are reviewed at length today  CHA2DS2-VASc Score = 2  The patient's score is based upon: CHF History: 0 HTN History: 1 Diabetes History: 0 Stroke History: 0 Vascular Disease History: 1 Age Score: 0 Gender Score: 0       ASSESSMENT AND PLAN: 1. Persistent Atrial Fibrillation (ICD10:  I48.19) The patient's CHA2DS2-VASc score is 2, indicating a 2.2% annual risk of stroke.   General education about afib provided and questions answered. We also discussed his stroke risk and the risks and benefits of anticoagulation. We discussed rhythm control options today. After discussing the risks and benefits, will plan for DCCV now that he has been on anticoagulation >3 weeks.  Continue Eliquis 5 mg BID Continue Toprol 50 mg daily  2. Secondary Hypercoagulable  State (ICD10:  D68.69) The patient is at significant risk for stroke/thromboembolism based upon his CHA2DS2-VASc Score of 2.  Continue Apixaban (Eliquis).   3. CAD S/p  CABG 2017 No anginal symptoms.  4. HTN Elevated today. Will reevaluate in SR, may need to go back up on amlodipine if his readings remain elevated.    Follow up in the AF clinic 1-2 weeks post DCCV.    Jorja Loa PA-C [facility] [address] [place] [address] [phone] 03/10/2022 9:50 AM

## 2022-03-10 NOTE — Patient Instructions (Signed)
Cardioversion scheduled for: Tuesday, January 2nd   - Arrive at the [facility] and go to admitting at 1030am   - Do not eat or drink anything after midnight the night prior to your procedure.   - Take all your morning medication (except diabetic medications) with a sip of water prior to arrival. - You will not be able to drive home after your procedure.    - Do NOT miss any doses of your blood thinner - if you should miss a dose please notify our office immediately.   - If you feel as if you go back into normal rhythm prior to scheduled cardioversion, please notify our office immediately.  If your procedure is canceled in the cardioversion suite you will be charged a cancellation fee.   If you are on weekly OZEMPIC, TRULICITY, MOUNJARO, OR BYDUREON  Hold medication 7 days prior to scheduled procedure/anesthesia.  Restart medication on the normal dosing day after scheduled procedure/anesthesia  If you are on daily BYETTA, WEGOVY, VICTOZA, ADLYXIN, OR RYBELSUS:   Hold medication 24 hours prior to scheduled procedure/anesthesia.   Restart medication on the following day after scheduled procedure/anesthesia   For those patients who have a scheduled procedure/anesthesia on the same day of the week as their dose, hold the medication on the day of surgery.  They can take their scheduled dose the week before.  **Patients on the above medications scheduled for elective procedures that have not held the medication for the appropriate amount of time are at risk of cancellation or change in the anesthetic plan.

## 2022-03-10 NOTE — Progress Notes (Signed)
Primary Care Physician: Dorna Mai, MD Primary Cardiologist: Dr Johney Frame Primary Electrophysiologist: none Referring Physician: Dr Otis Peak Franzen is a 56 y.o. male with a history of CAD s/p CABG 2017, tobacco abuse, HTN, HLD, atrial fibrillation who presents for consultation in the [facility].  The patient was initially diagnosed with atrial fibrillation in 2017 postoperatively following his CABG. He was seen by Dr Johney Frame 02/17/22 and was found to be back in afib at that visit. He was started on Eliquis for a CHADS2VASC score of 2. His amlodipine was also decreased due to low BP and dizziness. Patient reports that he has had more headaches recently but is not very aware of his arrhythmia. No bleeding issues on anticoagulation. He denies significant alcohol use, snores intermittently.   Today, he denies symptoms of palpitations, chest pain, shortness of breath, orthopnea, PND, lower extremity edema, dizziness, presyncope, syncope, daytime somnolence, bleeding, or neurologic sequela. The patient is tolerating medications without difficulties and is otherwise without complaint today.    Atrial Fibrillation Risk Factors:  he does have symptoms or diagnosis of sleep apnea. he does not have a history of rheumatic fever. he does not have a history of alcohol use. The patient does not have a history of early familial atrial fibrillation or other arrhythmias.  he has a BMI of Body mass index is 29.12 kg/m.Marland Kitchen Filed Weights   03/10/22 0914  Weight: 94.7 kg    Family History  Problem Relation Age of Onset   Heart attack Father 26       Deceased at this age   17 Father        Lung   Diabetes Sister      Atrial Fibrillation Management history:  Previous antiarrhythmic drugs: amiodarone (post op) Previous cardioversions: none Previous ablations: none CHADS2VASC score: 2 Anticoagulation history: Eliquis   Past Medical History:   Diagnosis Date   Anxiety    Depression    Hyperlipidemia    Hypertension    Lumbar herniated disc dx'd 03/2015   NSTEMI (non-ST elevated myocardial infarction) ([place]) 04/15/2015   Past Surgical History:  Procedure Laterality Date   BACK SURGERY     CARDIAC CATHETERIZATION  04/15/2015   CARDIAC CATHETERIZATION [name] 04/15/2015   Procedure: Left Heart Cath and Coronary Angiography;  Surgeon: Lorretta Harp, MD;  Location: [place] [facility];  Service: Cardiovascular;  Laterality: [name];   CARDIAC CATHETERIZATION [name] 05/20/2015   Procedure: Left Heart Cath and Cors/Grafts Angiography;  Surgeon: Peter M Martinique, MD;  Location: [facility];  Service: Cardiovascular;  Laterality: [name];   CORONARY ARTERY BYPASS GRAFT [name] 04/18/2015   Procedure: CORONARY ARTERY BYPASS GRAFTING times four with LIMA  to LAD, LEFT RADIAL ARTERY to OM, Right saphenous vein harvesting and utilized to Interm and to PD [place] ([facility]) RIGHT THIGH;  Surgeon: Grace Isaac, MD;  Location: [facility];  Service: Open Heart Surgery;  Laterality: [name];   LUMBAR LAMINECTOMY  2002   RADIAL ARTERY HARVEST Left 04/18/2015   Procedure:  LEFT Arm RADIAL ARTERY HARVEST;  Surgeon: Grace Isaac, MD;  Location: [place];  Service: Open Heart Surgery;  Laterality: Left;   TEE WITHOUT CARDIOVERSION [name] 04/18/2015   Procedure: TRANSESOPHAGEAL ECHOCARDIOGRAM (TEE);  Surgeon: Grace Isaac, MD;  Location: [facility];  Service: Open Heart Surgery;  Laterality: [name];   TONSILLECTOMY  1960s    Current Outpatient Medications  Medication Sig Dispense Refill  amLODipine (NORVASC) 5 MG tablet Take 1 tablet (5 mg total) by mouth daily. 90 tablet 2   apixaban (ELIQUIS) 5 MG TABS tablet Take 1 tablet (5 mg total) by mouth 2 (two) times daily. 60 tablet 5   atorvastatin (LIPITOR) 40 MG tablet Take 40 mg by mouth daily.     ezetimibe (ZETIA) 10 MG tablet Take 1 tablet (10 mg total) by mouth daily. 90 tablet 1    fenofibrate (TRICOR) 145 MG tablet Take 1 tablet (145 mg total) by mouth daily. 90 tablet 2   finasteride (PROSCAR) 5 MG tablet TAKE 1 TABLET BY MOUTH EVERY DAY 90 tablet 3   FLUoxetine (PROZAC) 10 MG tablet Take 3 tablets (30 mg total) by mouth daily. 270 tablet 1   hydrochlorothiazide (HYDRODIURIL) 25 MG tablet Take 1 tablet (25 mg total) by mouth daily. 90 tablet 2   icosapent Ethyl (VASCEPA) 1 g capsule Take 2 capsules (2 g total) by mouth 2 (two) times daily. 360 capsule 2   isosorbide mononitrate (IMDUR) 60 MG 24 hr tablet Take 1.5 tablets (90 mg total) by mouth daily. 135 tablet 3   levocetirizine (XYZAL) 5 MG tablet Take 5 mg by mouth every evening.     lisinopril (ZESTRIL) 40 MG tablet Take 1 tablet (40 mg total) by mouth daily. 90 tablet 3   metoprolol succinate (TOPROL-XL) 50 MG 24 hr tablet Take 1 tablet (50 mg total) by mouth daily. Take with or immediately following a meal. 90 tablet 3   nitroGLYCERIN (NITROSTAT) 0.4 MG SL tablet Place 1 tablet (0.4 mg total) under the tongue every 5 (five) minutes x 3 doses as needed for chest pain. 25 tablet 2   pantoprazole (PROTONIX) 40 MG tablet Take 1 tablet (40 mg total) by mouth daily. 90 tablet 1   Semaglutide-Weight Management 2.4 MG/0.75ML SOAJ Inject 2.4 mg into the skin once a week. 3 mL 6   silodosin (RAPAFLO) 8 MG CAPS capsule Take 8 mg by mouth daily.     spironolactone (ALDACTONE) 25 MG tablet Take 1 tablet (25 mg total) by mouth daily. 90 tablet 3   No current facility-administered medications for this encounter.    Allergies  Allergen Reactions   Atorvastatin Other (See Comments)    80 mg dose legs cramps, tolerates 40 mg dose   Claritin [Loratadine] Other (See Comments)    Makes allergies worse-"a lot worse"   Codeine Nausea And Vomiting   Vicodin [Hydrocodone-Acetaminophen] Nausea And Vomiting    "pretty much any codeine"    Social History   Socioeconomic History   Marital status: Married    Spouse name: Not on file    Number of children: Not on file   Years of education: Not on file   Highest education level: Not on file  Occupational History   Not on file  Tobacco Use   Smoking status: Some Days    Packs/day: 1.50    Years: 33.00    Total pack years: 49.50    Types: Cigarettes    Last attempt to quit: 04/15/2015    Years since quitting: 6.9   Smokeless tobacco: Never  Vaping Use   Vaping Use: Never used  Substance and Sexual Activity   Alcohol use: Yes    Alcohol/week: 0.0 standard drinks of alcohol    Comment: occasionally   Drug use: Not Currently    Types: Marijuana, Cocaine    Comment: "in my teens"   Sexual activity: Not on file  Other Topics  Concern   Not on file  Social History Narrative   Not on file   Social Determinants of Health   Financial Resource Strain: Not on file  Food Insecurity: No Food Insecurity (10/28/2021)   Hunger Vital Sign    Worried About Running Out of Food in the Last Year: Never true    Ran Out of Food in the Last Year: Never true  Transportation Needs: No Transportation Needs (10/28/2021)   PRAPARE - Hydrologist (Medical): No    Lack of Transportation (Non-Medical): No  Physical Activity: Not on file  Stress: Not on file  Social Connections: Not on file  Intimate Partner Violence: Not on file     ROS- All systems are reviewed and negative except as per the HPI above.  Physical Exam: Vitals:   03/10/22 0914  BP: (!) 146/106  Pulse: 66  Weight: 94.7 kg  Height: 5\' 11"  (1.803 m)    GEN- The patient is a well appearing male, alert and oriented x 3 today.   Head- normocephalic, atraumatic Eyes-  Sclera clear, conjunctiva pink Ears- hearing intact Oropharynx- clear Neck- supple  Lungs- Clear to ausculation bilaterally, normal work of breathing Heart- irregular rate and rhythm, no murmurs, rubs or gallops  GI- soft, NT, ND, + BS Extremities- no clubbing, cyanosis, or edema MS- no significant deformity or  atrophy Skin- no rash or lesion Psych- euthymic mood, full affect Neuro- strength and sensation are intact  Wt Readings from Last 3 Encounters:  03/10/22 94.7 kg  02/17/22 95.3 kg  01/29/22 93.8 kg    EKG today demonstrates  Afib Vent. rate 66 BPM PR interval * ms QRS duration 94 ms QT/QTcB 414/434 ms  Echo 03/08/22 demonstrated   1. Left ventricular ejection fraction, by estimation, is 60 to 65%. The  left ventricle has normal function. The left ventricle has no regional  wall motion abnormalities. Left ventricular diastolic parameters are  indeterminate.   2. Right ventricular systolic function is normal. The right ventricular  size is normal.   3. Left atrial size was mildly dilated.   4. Right atrial size was mildly dilated.   5. The mitral valve is normal in structure. Mild mitral valve  regurgitation.   6. The aortic valve is tricuspid. Aortic valve regurgitation is not  visualized. Aortic valve sclerosis/calcification is present, without any  evidence of aortic stenosis.   7. The inferior vena cava is dilated in size with <50% respiratory  variability, suggesting right atrial pressure of 15 mmHg.   Epic records are reviewed at length today  CHA2DS2-VASc Score = 2  The patient's score is based upon: CHF History: 0 HTN History: 1 Diabetes History: 0 Stroke History: 0 Vascular Disease History: 1 Age Score: 0 Gender Score: 0       ASSESSMENT AND PLAN: 1. Persistent Atrial Fibrillation (ICD10:  I48.19) The patient's CHA2DS2-VASc score is 2, indicating a 2.2% annual risk of stroke.   General education about afib provided and questions answered. We also discussed his stroke risk and the risks and benefits of anticoagulation. We discussed rhythm control options today. After discussing the risks and benefits, will plan for DCCV now that he has been on anticoagulation >3 weeks.  Continue Eliquis 5 mg BID Continue Toprol 50 mg daily  2. Secondary Hypercoagulable  State (ICD10:  D68.69) The patient is at significant risk for stroke/thromboembolism based upon his CHA2DS2-VASc Score of 2.  Continue Apixaban (Eliquis).   3. CAD S/p  CABG 2017 No anginal symptoms.  4. HTN Elevated today. Will reevaluate in SR, may need to go back up on amlodipine if his readings remain elevated.    Follow up in the AF clinic 1-2 weeks post DCCV.    Jorja Loa PA-C [facility] [address] [place] [address] [phone] 03/10/2022 9:50 AM

## 2022-03-18 ENCOUNTER — Ambulatory Visit (HOSPITAL_COMMUNITY)
Admission: RE | Admit: 2022-03-18 | Discharge: 2022-03-18 | Disposition: A | Discharge status: Home / Self Care | Payer: BLUE CROSS/BLUE SHIELD | Source: Ambulatory Visit | Attending: Nurse Practitioner | Admitting: Nurse Practitioner

## 2022-03-18 DIAGNOSIS — I4819 Other persistent atrial fibrillation: Secondary | ICD-10-CM

## 2022-03-18 LAB — CBC
HCT: 47.3 % (ref 39.0–52.0)
Hemoglobin: 17 g/dL (ref 13.0–17.0)
MCH: 31.7 pg (ref 26.0–34.0)
MCHC: 35.9 g/dL (ref 30.0–36.0)
MCV: 88.1 fL (ref 80.0–100.0)
Platelets: 177 10*3/uL (ref 150–400)
RBC: 5.37 MIL/uL (ref 4.22–5.81)
RDW: 13.2 % (ref 11.5–15.5)
WBC: 8.4 10*3/uL (ref 4.0–10.5)
nRBC: 0 % (ref 0.0–0.2)

## 2022-03-18 LAB — BASIC METABOLIC PANEL
Anion gap: 7 (ref 5–15)
BUN: 20 mg/dL (ref 6–20)
CO2: 26 mmol/L (ref 22–32)
Calcium: 9.3 mg/dL (ref 8.9–10.3)
Chloride: 105 mmol/L (ref 98–111)
Creatinine, Ser: 1.03 mg/dL (ref 0.61–1.24)
GFR, Estimated: >60 mL/min (ref >60)
Glucose, Bld: 101 mg/dL — ABNORMAL HIGH (ref 70–99)
Potassium: 4.1 mmol/L (ref 3.5–5.1)
Sodium: 138 mmol/L (ref 135–145)

## 2022-03-23 ENCOUNTER — Encounter (HOSPITAL_COMMUNITY)
Admission: RE | Disposition: A | Discharge status: Home / Self Care | Payer: Self-pay | Source: Ambulatory Visit | Attending: Internal Medicine

## 2022-03-23 ENCOUNTER — Ambulatory Visit (HOSPITAL_COMMUNITY): Payer: BLUE CROSS/BLUE SHIELD | Admitting: Anesthesiology

## 2022-03-23 ENCOUNTER — Ambulatory Visit (HOSPITAL_COMMUNITY)
Admission: RE | Admit: 2022-03-23 | Discharge: 2022-03-23 | Disposition: A | Discharge status: Home / Self Care | Payer: BLUE CROSS/BLUE SHIELD | Source: Ambulatory Visit | Attending: Internal Medicine | Admitting: Internal Medicine

## 2022-03-23 ENCOUNTER — Other Ambulatory Visit: Payer: Self-pay

## 2022-03-23 ENCOUNTER — Encounter (HOSPITAL_COMMUNITY): Payer: Self-pay | Admitting: Internal Medicine

## 2022-03-23 DIAGNOSIS — E785 Hyperlipidemia, unspecified: Secondary | ICD-10-CM | POA: Insufficient documentation

## 2022-03-23 DIAGNOSIS — I10 Essential (primary) hypertension: Secondary | ICD-10-CM | POA: Diagnosis not present

## 2022-03-23 DIAGNOSIS — F17210 Nicotine dependence, cigarettes, uncomplicated: Secondary | ICD-10-CM | POA: Insufficient documentation

## 2022-03-23 DIAGNOSIS — Z951 Presence of aortocoronary bypass graft: Secondary | ICD-10-CM | POA: Insufficient documentation

## 2022-03-23 DIAGNOSIS — K219 Gastro-esophageal reflux disease without esophagitis: Secondary | ICD-10-CM | POA: Insufficient documentation

## 2022-03-23 DIAGNOSIS — Z79899 Other long term (current) drug therapy: Secondary | ICD-10-CM | POA: Diagnosis not present

## 2022-03-23 DIAGNOSIS — I4819 Other persistent atrial fibrillation: Secondary | ICD-10-CM

## 2022-03-23 DIAGNOSIS — Z7985 Long-term (current) use of injectable non-insulin antidiabetic drugs: Secondary | ICD-10-CM | POA: Diagnosis not present

## 2022-03-23 DIAGNOSIS — I4891 Unspecified atrial fibrillation: Secondary | ICD-10-CM | POA: Diagnosis not present

## 2022-03-23 DIAGNOSIS — I2510 Atherosclerotic heart disease of native coronary artery without angina pectoris: Secondary | ICD-10-CM | POA: Diagnosis not present

## 2022-03-23 DIAGNOSIS — G709 Myoneural disorder, unspecified: Secondary | ICD-10-CM | POA: Diagnosis not present

## 2022-03-23 DIAGNOSIS — F32A Depression, unspecified: Secondary | ICD-10-CM | POA: Insufficient documentation

## 2022-03-23 DIAGNOSIS — F419 Anxiety disorder, unspecified: Secondary | ICD-10-CM | POA: Diagnosis not present

## 2022-03-23 DIAGNOSIS — Z7901 Long term (current) use of anticoagulants: Secondary | ICD-10-CM | POA: Insufficient documentation

## 2022-03-23 DIAGNOSIS — I252 Old myocardial infarction: Secondary | ICD-10-CM | POA: Insufficient documentation

## 2022-03-23 DIAGNOSIS — I340 Nonrheumatic mitral (valve) insufficiency: Secondary | ICD-10-CM | POA: Diagnosis not present

## 2022-03-23 HISTORY — PX: CARDIOVERSION: SHX1299

## 2022-03-23 SURGERY — CARDIOVERSION
Anesthesia: General

## 2022-03-23 MED ORDER — SODIUM CHLORIDE 0.9 % IV SOLN: INTRAVENOUS | Status: DC

## 2022-03-23 MED ORDER — LIDOCAINE 2% (20 MG/ML) 5 ML SYRINGE
INTRAMUSCULAR | Status: DC | PRN
  Administered 2022-03-23: 100 mg via INTRAVENOUS

## 2022-03-23 MED ORDER — PROPOFOL 10 MG/ML IV BOLUS
INTRAVENOUS | Status: DC | PRN
  Administered 2022-03-23: 80 mg via INTRAVENOUS

## 2022-03-23 NOTE — Discharge Instructions (Signed)

## 2022-03-23 NOTE — Anesthesia Preprocedure Evaluation (Addendum)
Anesthesia Evaluation  Patient identified by MRN, date of birth, ID band Patient awake    Reviewed: Allergy & Precautions, H&P , NPO status , Patient's Chart, lab work & pertinent test results  Airway Mallampati: II  TM Distance: >3 FB Neck ROM: Full    Dental no notable dental hx. (+) Edentulous Upper, Edentulous Lower, Upper Dentures,    Pulmonary Current Smoker   Pulmonary exam normal breath sounds clear to auscultation       Cardiovascular hypertension, Pt. on medications and Pt. on home beta blockers + CAD, + Past MI and + CABG  + dysrhythmias Atrial Fibrillation  Rhythm:Regular Rate:Normal  ECHO 12/23 1. Left ventricular ejection fraction, by estimation, is 60 to 65%. The  left ventricle has normal function. The left ventricle has no regional  wall motion abnormalities. Left ventricular diastolic parameters are  indeterminate.     Neuro/Psych  PSYCHIATRIC DISORDERS Anxiety Depression     Neuromuscular disease negative neurological ROS  negative psych ROS   GI/Hepatic negative GI ROS, Neg liver ROS,GERD  ,,  Endo/Other  negative endocrine ROS    Renal/GU negative Renal ROS  negative genitourinary   Musculoskeletal negative musculoskeletal ROS (+)    Abdominal   Peds negative pediatric ROS (+)  Hematology negative hematology ROS (+)   Anesthesia Other Findings   Reproductive/Obstetrics negative OB ROS                             Anesthesia Physical Anesthesia Plan  ASA: 3  Anesthesia Plan: General   Post-op Pain Management: Minimal or no pain anticipated   Induction: Intravenous  PONV Risk Score and Plan: 2 and Propofol infusion  Airway Management Planned: Natural Airway and Simple Face Mask  Additional Equipment: None  Intra-op Plan:   Post-operative Plan:   Informed Consent:   Plan Discussed with: Anesthesiologist  Anesthesia Plan Comments: (  )        Anesthesia Quick Evaluation

## 2022-03-23 NOTE — Transfer of Care (Signed)
Immediate Anesthesia Transfer of Care Note  Patient: [name]  Procedure(s) Performed: CARDIOVERSION  Patient Location: Endoscopy Unit  Anesthesia Type:General  Level of Consciousness: drowsy and patient cooperative  Airway & Oxygen Therapy: Patient Spontanous Breathing  Post-op Assessment: Report given to RN and Post -op Vital signs reviewed and stable  Post vital signs: Reviewed and stable  Last Vitals:  Vitals Value Taken Time  BP    Temp    Pulse    Resp    SpO2      Last Pain:  Vitals:   03/23/22 0951  TempSrc: Tympanic  PainSc: 0-No pain         Complications: No notable events documented.

## 2022-03-23 NOTE — Interval H&P Note (Signed)
History and Physical Interval Note:  03/23/2022 12:09 PM  [name]  has presented today for surgery, with the diagnosis of ATRIAL FIBRILLATION.  The various methods of treatment have been discussed with the patient and family. After consideration of risks, benefits and other options for treatment, the patient has consented to  Procedure(s): CARDIOVERSION ([name]) as a surgical intervention.  The patient's history has been reviewed, patient examined, no change in status, stable for surgery.  I have reviewed the patient's chart and labs.  Questions were answered to the patient's satisfaction.     Mckaylah Bettendorf A Deryk Bozman

## 2022-03-23 NOTE — Anesthesia Postprocedure Evaluation (Signed)
Anesthesia Post Note  Patient: [name]  Procedure(s) Performed: CARDIOVERSION     Patient location during evaluation: PACU Anesthesia Type: General Level of consciousness: awake and alert Pain management: pain level controlled Vital Signs Assessment: post-procedure vital signs reviewed and stable Respiratory status: spontaneous breathing, nonlabored ventilation, respiratory function stable and patient connected to nasal cannula oxygen Cardiovascular status: blood pressure returned to baseline and stable Postop Assessment: no apparent nausea or vomiting Anesthetic complications: no  No notable events documented.  Last Vitals:  Vitals:   03/23/22 1040 03/23/22 1050  BP: 113/86 (!) 126/95  Pulse: 67 65  Resp: 14 16  Temp:    SpO2: 96% 96%    Last Pain:  Vitals:   03/23/22 1050  TempSrc:   PainSc: 0-No pain                 Charae Depaolis

## 2022-03-23 NOTE — CV Procedure (Signed)
    Electrical Cardioversion Procedure Note Shmiel Morton [id number] 1966-03-21  Procedure: Electrical Cardioversion Indications:  Atrial Fibrillation  Time Out: Verified patient identification, verified procedure,medications/allergies/relevent history reviewed, required imaging and test results available.  Performed  Procedure Details  The patient was NPO after midnight. Anesthesia was administered at the beside  by Dr.Oddono.  Cardioversion was done with synchronized biphasic defibrillation with AP pads with 200 Joules.  The patient converted to normal sinus rhythm. The patient tolerated the procedure well   IMPRESSION:  Successful cardioversion of atrial fibrillation    Lamone Ferrelli A Kacyn Souder 03/23/2022, 10:26 AM

## 2022-03-24 ENCOUNTER — Encounter (HOSPITAL_COMMUNITY): Payer: Self-pay | Admitting: Internal Medicine

## 2022-04-02 ENCOUNTER — Other Ambulatory Visit: Payer: Self-pay

## 2022-04-02 MED ORDER — SPIRONOLACTONE 25 MG PO TABS: 25.0000 mg | ORAL_TABLET | Freq: Every day | ORAL | 3 refills | Status: DC

## 2022-04-05 ENCOUNTER — Other Ambulatory Visit: Payer: Self-pay

## 2022-04-05 DIAGNOSIS — I2510 Atherosclerotic heart disease of native coronary artery without angina pectoris: Secondary | ICD-10-CM

## 2022-04-05 DIAGNOSIS — Z79899 Other long term (current) drug therapy: Secondary | ICD-10-CM

## 2022-04-05 DIAGNOSIS — Z951 Presence of aortocoronary bypass graft: Secondary | ICD-10-CM

## 2022-04-05 DIAGNOSIS — E782 Mixed hyperlipidemia: Secondary | ICD-10-CM

## 2022-04-05 DIAGNOSIS — E785 Hyperlipidemia, unspecified: Secondary | ICD-10-CM

## 2022-04-05 DIAGNOSIS — I10 Essential (primary) hypertension: Secondary | ICD-10-CM

## 2022-04-05 MED ORDER — FENOFIBRATE 145 MG PO TABS: 145.0000 mg | ORAL_TABLET | Freq: Every day | ORAL | 3 refills | Status: DC

## 2022-04-05 MED ORDER — LISINOPRIL 40 MG PO TABS: 40.0000 mg | ORAL_TABLET | Freq: Every day | ORAL | 3 refills | Status: DC

## 2022-04-05 MED ORDER — HYDROCHLOROTHIAZIDE 25 MG PO TABS: 25.0000 mg | ORAL_TABLET | Freq: Every day | ORAL | 3 refills | Status: DC

## 2022-04-05 MED ORDER — METOPROLOL SUCCINATE ER 50 MG PO TB24: 50.0000 mg | ORAL_TABLET | Freq: Every day | ORAL | 3 refills | Status: DC

## 2022-04-05 NOTE — Telephone Encounter (Signed)
This is a A-Fib clinic pt. °

## 2022-04-05 NOTE — Addendum Note (Signed)
Addended by: Carter Kitten D on: 04/05/2022 11:53 AM   Modules accepted: Orders

## 2022-04-07 ENCOUNTER — Other Ambulatory Visit: Payer: Self-pay

## 2022-04-07 MED ORDER — ATORVASTATIN CALCIUM 40 MG PO TABS: 40.0000 mg | ORAL_TABLET | Freq: Every day | ORAL | 3 refills | Status: DC

## 2022-04-07 NOTE — Telephone Encounter (Signed)
This is a A-Fib clinic pt. °

## 2022-04-08 ENCOUNTER — Encounter (HOSPITAL_COMMUNITY): Payer: Self-pay | Admitting: Physician Assistant

## 2022-04-08 ENCOUNTER — Ambulatory Visit (HOSPITAL_COMMUNITY)
Admission: RE | Admit: 2022-04-08 | Discharge: 2022-04-08 | Disposition: A | Discharge status: Home / Self Care | Payer: BLUE CROSS/BLUE SHIELD | Source: Ambulatory Visit | Attending: Physician Assistant | Admitting: Physician Assistant

## 2022-04-08 VITALS — BP 130/90 | HR 69 | Ht 71.0 in | Wt 211.4 lb

## 2022-04-08 DIAGNOSIS — Z8249 Family history of ischemic heart disease and other diseases of the circulatory system: Secondary | ICD-10-CM | POA: Diagnosis not present

## 2022-04-08 DIAGNOSIS — Z7901 Long term (current) use of anticoagulants: Secondary | ICD-10-CM | POA: Diagnosis not present

## 2022-04-08 DIAGNOSIS — I4819 Other persistent atrial fibrillation: Secondary | ICD-10-CM | POA: Diagnosis not present

## 2022-04-08 DIAGNOSIS — D6869 Other thrombophilia: Secondary | ICD-10-CM

## 2022-04-08 DIAGNOSIS — E785 Hyperlipidemia, unspecified: Secondary | ICD-10-CM | POA: Diagnosis not present

## 2022-04-08 DIAGNOSIS — Z951 Presence of aortocoronary bypass graft: Secondary | ICD-10-CM | POA: Diagnosis not present

## 2022-04-08 DIAGNOSIS — Z79899 Other long term (current) drug therapy: Secondary | ICD-10-CM | POA: Insufficient documentation

## 2022-04-08 DIAGNOSIS — I2510 Atherosclerotic heart disease of native coronary artery without angina pectoris: Secondary | ICD-10-CM | POA: Insufficient documentation

## 2022-04-08 DIAGNOSIS — F17210 Nicotine dependence, cigarettes, uncomplicated: Secondary | ICD-10-CM | POA: Diagnosis not present

## 2022-04-08 DIAGNOSIS — I10 Essential (primary) hypertension: Secondary | ICD-10-CM | POA: Diagnosis not present

## 2022-04-08 NOTE — Progress Notes (Signed)
Primary Care Physician: Dorna Mai, MD Primary Cardiologist: Dr Johney Frame Primary Electrophysiologist: none Referring Physician: Dr Otis Peak [name] is a 57 y.o. male with a history of CAD s/p CABG 2017, tobacco abuse, HTN, HLD, atrial fibrillation who presents for follow up in the [facility].  The patient was initially diagnosed with atrial fibrillation in 2017 postoperatively following his CABG. He was seen by Dr Johney Frame 02/17/22 and was found to be back in afib at that visit. He was started on Eliquis for a CHADS2VASC score of 2. His amlodipine was also decreased due to low BP and dizziness. He denies significant alcohol use, snores intermittently.   On follow up today, patient is s/p DCCV on 03/23/22. He reports that he feels better in SR with more energy. No bleeding issues on anticoagulation.   Today, he denies symptoms of palpitations, chest pain, shortness of breath, orthopnea, PND, lower extremity edema, dizziness, presyncope, syncope, daytime somnolence, bleeding, or neurologic sequela. The patient is tolerating medications without difficulties and is otherwise without complaint today.    Atrial Fibrillation Risk Factors:  he does have symptoms or diagnosis of sleep apnea. he does not have a history of rheumatic fever. he does not have a history of alcohol use. The patient does not have a history of early familial atrial fibrillation or other arrhythmias.  he has a BMI of Body mass index is 29.48 kg/m.Marland Kitchen Filed Weights   04/08/22 0946  Weight: 95.9 kg    Family History  Problem Relation Age of Onset   Heart attack Father 36       Deceased at this age   33 Father        Lung   Diabetes Sister      Atrial Fibrillation Management history:  Previous antiarrhythmic drugs: amiodarone (post op) Previous cardioversions: 03/23/22 Previous ablations: none CHADS2VASC score: 2 Anticoagulation history: Eliquis   Past  Medical History:  Diagnosis Date   Anxiety    Depression    Hyperlipidemia    Hypertension    Lumbar herniated disc dx'd 03/2015   NSTEMI (non-ST elevated myocardial infarction) ([place]) 04/15/2015   Past Surgical History:  Procedure Laterality Date   BACK SURGERY     CARDIAC CATHETERIZATION  04/15/2015   CARDIAC CATHETERIZATION [name] 04/15/2015   Procedure: Left Heart Cath and Coronary Angiography;  Surgeon: Lorretta Harp, MD;  Location: [facility];  Service: Cardiovascular;  Laterality: [name];   CARDIAC CATHETERIZATION [name] 05/20/2015   Procedure: Left Heart Cath and Cors/Grafts Angiography;  Surgeon: Peter M Martinique, MD;  Location: [facility];  Service: Cardiovascular;  Laterality: [name];   CARDIOVERSION [name] 03/23/2022   Procedure: CARDIOVERSION;  Surgeon: Werner Lean, MD;  Location: [facility];  Service: Cardiovascular;  Laterality: [name];   CORONARY ARTERY BYPASS GRAFT [name] 04/18/2015   Procedure: CORONARY ARTERY BYPASS GRAFTING times four with LIMA  to LAD, LEFT RADIAL ARTERY to OM, Right saphenous vein harvesting and utilized to Interm and to PD [place] ([place]) RIGHT THIGH;  Surgeon: Grace Isaac, MD;  Location: [facility];  Service: Open Heart Surgery;  Laterality: [name];   LUMBAR LAMINECTOMY  2002   RADIAL ARTERY HARVEST Left 04/18/2015   Procedure:  LEFT Arm RADIAL ARTERY HARVEST;  Surgeon: Grace Isaac, MD;  Location: [facility];  Service: Open Heart Surgery;  Laterality: Left;   TEE WITHOUT CARDIOVERSION [name] 04/18/2015   Procedure: TRANSESOPHAGEAL ECHOCARDIOGRAM (TEE);  Surgeon:  Grace Isaac, MD;  Location: [facility];  Service: Open Heart Surgery;  Laterality: [name];   TONSILLECTOMY  1960s    Current Outpatient Medications  Medication Sig Dispense Refill   acetaminophen (TYLENOL) 500 MG tablet Take 500 mg by mouth every 6 (six) hours as needed for moderate pain.     amLODipine (NORVASC) 10 MG tablet Take 1 tablet (10 mg total) by mouth  daily. 90 tablet 1   apixaban (ELIQUIS) 5 MG TABS tablet Take 1 tablet (5 mg total) by mouth 2 (two) times daily. 60 tablet 5   atorvastatin (LIPITOR) 40 MG tablet Take 1 tablet (40 mg total) by mouth daily. 90 tablet 3   ezetimibe (ZETIA) 10 MG tablet Take 1 tablet (10 mg total) by mouth daily. 90 tablet 1   fenofibrate (TRICOR) 145 MG tablet Take 1 tablet (145 mg total) by mouth daily. 90 tablet 3   finasteride (PROSCAR) 5 MG tablet TAKE 1 TABLET BY MOUTH EVERY DAY 90 tablet 3   FLUoxetine (PROZAC) 10 MG tablet Take 3 tablets (30 mg total) by mouth daily. 270 tablet 1   hydrochlorothiazide (HYDRODIURIL) 25 MG tablet Take 1 tablet (25 mg total) by mouth daily. 90 tablet 3   icosapent Ethyl (VASCEPA) 1 g capsule Take 2 capsules (2 g total) by mouth 2 (two) times daily. 360 capsule 2   isosorbide mononitrate (IMDUR) 60 MG 24 hr tablet Take 1.5 tablets (90 mg total) by mouth daily. 135 tablet 3   levocetirizine (XYZAL) 5 MG tablet Take 5 mg by mouth daily as needed for allergies.     lisinopril (ZESTRIL) 40 MG tablet Take 1 tablet (40 mg total) by mouth daily. 90 tablet 3   metoprolol succinate (TOPROL-XL) 50 MG 24 hr tablet Take 1 tablet (50 mg total) by mouth daily. Take with or immediately following a meal. 90 tablet 3   nitroGLYCERIN (NITROSTAT) 0.4 MG SL tablet Place 1 tablet (0.4 mg total) under the tongue every 5 (five) minutes x 3 doses as needed for chest pain. 25 tablet 2   pantoprazole (PROTONIX) 40 MG tablet Take 1 tablet (40 mg total) by mouth daily. 90 tablet 1   Semaglutide-Weight Management 2.4 MG/0.75ML SOAJ Inject 2.4 mg into the skin once a week. 3 mL 6   silodosin (RAPAFLO) 8 MG CAPS capsule Take 8 mg by mouth daily.     spironolactone (ALDACTONE) 25 MG tablet Take 1 tablet (25 mg total) by mouth daily. 90 tablet 3   No current facility-administered medications for this encounter.    Allergies  Allergen Reactions   Atorvastatin Other (See Comments)    80 mg dose legs  cramps, tolerates 40 mg dose   Claritin [Loratadine] Other (See Comments)    Makes allergies worse-"a lot worse"   Codeine Nausea And Vomiting   Vicodin [Hydrocodone-Acetaminophen] Nausea And Vomiting    "pretty much any codeine"    Social History   Socioeconomic History   Marital status: Married    Spouse name: Not on file   Number of children: Not on file   Years of education: Not on file   Highest education level: Not on file  Occupational History   Not on file  Tobacco Use   Smoking status: Every Day    Packs/day: 1.00    Years: 6.00    Total pack years: 6.00    Types: Cigarettes   Smokeless tobacco: Never   Tobacco comments:    Currently smoke 1 pack daily 04/08/22  Vaping Use   Vaping Use: Never used  Substance and Sexual Activity   Alcohol use: Yes    Alcohol/week: 0.0 standard drinks of alcohol    Comment: occasionally   Drug use: Not Currently    Types: Marijuana, Cocaine    Comment: "in my teens"   Sexual activity: Not on file  Other Topics Concern   Not on file  Social History Narrative   Not on file   Social Determinants of Health   Financial Resource Strain: Not on file  Food Insecurity: No Food Insecurity (10/28/2021)   Hunger Vital Sign    Worried About Running Out of Food in the Last Year: Never true    Ran Out of Food in the Last Year: Never true  Transportation Needs: No Transportation Needs (10/28/2021)   PRAPARE - Hydrologist (Medical): No    Lack of Transportation (Non-Medical): No  Physical Activity: Not on file  Stress: Not on file  Social Connections: Not on file  Intimate Partner Violence: Not on file     ROS- All systems are reviewed and negative except as per the HPI above.  Physical Exam: Vitals:   04/08/22 0946  BP: (!) 130/90  Pulse: 69  Weight: 95.9 kg  Height: 5\' 11"  (1.803 m)     GEN- The patient is a well appearing male, alert and oriented x 3 today.   HEENT-head normocephalic,  atraumatic, sclera clear, conjunctiva pink, hearing intact, trachea midline. Lungs- Clear to ausculation bilaterally, normal work of breathing Heart- Regular rate and rhythm, no murmurs, rubs or gallops  GI- soft, NT, ND, + BS Extremities- no clubbing, cyanosis, or edema MS- no significant deformity or atrophy Skin- no rash or lesion Psych- euthymic mood, full affect Neuro- strength and sensation are intact   Wt Readings from Last 3 Encounters:  04/08/22 95.9 kg  03/23/22 94.3 kg  03/10/22 94.7 kg    EKG today demonstrates  SR Vent. rate 69 BPM PR interval 142 ms QRS duration 92 ms QT/QTcB 388/415 ms  Echo 03/08/22 demonstrated   1. Left ventricular ejection fraction, by estimation, is 60 to 65%. The  left ventricle has normal function. The left ventricle has no regional  wall motion abnormalities. Left ventricular diastolic parameters are  indeterminate.   2. Right ventricular systolic function is normal. The right ventricular  size is normal.   3. Left atrial size was mildly dilated.   4. Right atrial size was mildly dilated.   5. The mitral valve is normal in structure. Mild mitral valve  regurgitation.   6. The aortic valve is tricuspid. Aortic valve regurgitation is not  visualized. Aortic valve sclerosis/calcification is present, without any  evidence of aortic stenosis.   7. The inferior vena cava is dilated in size with <50% respiratory  variability, suggesting right atrial pressure of 15 mmHg.   Epic records are reviewed at length today  CHA2DS2-VASc Score = 2  The patient's score is based upon: CHF History: 0 HTN History: 1 Diabetes History: 0 Stroke History: 0 Vascular Disease History: 1 Age Score: 0 Gender Score: 0       ASSESSMENT AND PLAN: 1. Persistent Atrial Fibrillation (ICD10:  I48.19) The patient's CHA2DS2-VASc score is 2, indicating a 2.2% annual risk of stroke.   S/p DCCV 03/23/22 Patient appears to be maintaining SR. We did discuss that  if he has more frequent afib in the future, we could consider ablation or AAD. He would be a  good candidate for front-line ablation.  Continue Eliquis 5 mg BID Continue Toprol 50 mg daily  2. Secondary Hypercoagulable State (ICD10:  D68.69) The patient is at significant risk for stroke/thromboembolism based upon his CHA2DS2-VASc Score of 2.  Continue Apixaban (Eliquis).   3. CAD S/p CABG 2017 No anginal symptoms.  4. HTN Stable, no changes today.   Follow up with Dr Shari Prows as scheduled and the AF clinic in one year.    Jorja Loa PA-C [facility] [address] [place], [place] [address] [phone] 04/08/2022 10:04 AM

## 2022-04-29 ENCOUNTER — Encounter (HOSPITAL_COMMUNITY): Payer: Self-pay | Admitting: *Deleted

## 2022-05-21 ENCOUNTER — Other Ambulatory Visit: Payer: Self-pay | Admitting: Urology

## 2022-05-27 DIAGNOSIS — N401 Enlarged prostate with lower urinary tract symptoms: Secondary | ICD-10-CM | POA: Diagnosis not present

## 2022-05-27 DIAGNOSIS — R3912 Poor urinary stream: Secondary | ICD-10-CM | POA: Diagnosis not present

## 2022-05-31 ENCOUNTER — Telehealth: Payer: Self-pay

## 2022-05-31 NOTE — Telephone Encounter (Signed)
**Note De-Identified Jettson Crable Obfuscation** [employer]ap[employer] PA started through covermymeds. Key: Bufford Spikes

## 2022-06-03 DIAGNOSIS — R972 Elevated prostate specific antigen [PSA]: Secondary | ICD-10-CM | POA: Diagnosis not present

## 2022-06-03 DIAGNOSIS — N401 Enlarged prostate with lower urinary tract symptoms: Secondary | ICD-10-CM | POA: Diagnosis not present

## 2022-06-03 DIAGNOSIS — R3912 Poor urinary stream: Secondary | ICD-10-CM | POA: Diagnosis not present

## 2022-07-05 ENCOUNTER — Encounter: Payer: Self-pay | Admitting: *Deleted

## 2022-07-08 ENCOUNTER — Other Ambulatory Visit: Payer: Self-pay

## 2022-07-08 MED ORDER — ICOSAPENT ETHYL 1 G PO CAPS: 2.0000 g | ORAL_CAPSULE | Freq: Two times a day (BID) | ORAL | 2 refills | Status: DC

## 2022-07-12 ENCOUNTER — Telehealth: Payer: Self-pay

## 2022-07-12 DIAGNOSIS — H2513 Age-related nuclear cataract, bilateral: Secondary | ICD-10-CM | POA: Diagnosis not present

## 2022-07-12 NOTE — Telephone Encounter (Signed)
Pharmacy Patient Advocate Encounter  Prior Authorization for [facility] has been approved.    PA# [id number] Effective dates: 07/12/22 through 07/12/23  Haze Rushing, CPhT Pharmacy Patient Advocate Specialist Direct Number: [phone] Fax: [phone]

## 2022-07-12 NOTE — Telephone Encounter (Signed)
Pharmacy Patient Advocate Encounter   Received notification from [facility] that prior authorization for [facility] is needed.    PA submitted on 07/12/22 Key [id number] Status is pending  Haze Rushing, CPhT Pharmacy Patient Advocate Specialist Direct Number: [phone] Fax: [phone]

## 2022-07-19 ENCOUNTER — Inpatient Hospital Stay: Admission: RE | Admit: 2022-07-19 | Payer: BLUE CROSS/BLUE SHIELD | Source: Ambulatory Visit

## 2022-07-28 DIAGNOSIS — H2512 Age-related nuclear cataract, left eye: Secondary | ICD-10-CM | POA: Diagnosis not present

## 2022-08-03 ENCOUNTER — Encounter: Payer: Self-pay | Admitting: Family Medicine

## 2022-08-03 ENCOUNTER — Ambulatory Visit (INDEPENDENT_AMBULATORY_CARE_PROVIDER_SITE_OTHER): Payer: BLUE CROSS/BLUE SHIELD | Admitting: Family Medicine

## 2022-08-03 VITALS — BP 115/79 | HR 69 | Temp 98.1°F | Resp 16 | Ht 72.0 in | Wt 219.8 lb

## 2022-08-03 DIAGNOSIS — F419 Anxiety disorder, unspecified: Secondary | ICD-10-CM

## 2022-08-03 DIAGNOSIS — F17210 Nicotine dependence, cigarettes, uncomplicated: Secondary | ICD-10-CM

## 2022-08-03 DIAGNOSIS — Z0000 Encounter for general adult medical examination without abnormal findings: Secondary | ICD-10-CM

## 2022-08-03 DIAGNOSIS — Z1211 Encounter for screening for malignant neoplasm of colon: Secondary | ICD-10-CM

## 2022-08-03 DIAGNOSIS — Z130 Encounter for screening for diseases of the blood and blood-forming organs and certain disorders involving the immune mechanism: Secondary | ICD-10-CM

## 2022-08-03 DIAGNOSIS — F32A Depression, unspecified: Secondary | ICD-10-CM

## 2022-08-03 DIAGNOSIS — K219 Gastro-esophageal reflux disease without esophagitis: Secondary | ICD-10-CM

## 2022-08-03 DIAGNOSIS — Z13220 Encounter for screening for lipoid disorders: Secondary | ICD-10-CM | POA: Diagnosis not present

## 2022-08-03 DIAGNOSIS — R7301 Impaired fasting glucose: Secondary | ICD-10-CM | POA: Insufficient documentation

## 2022-08-03 NOTE — Progress Notes (Signed)
-  Patient is here to have annually  complete physical examination  -Care gap address -labs taken  

## 2022-08-04 LAB — CMP14+EGFR
ALT: 26 IU/L (ref 0–44)
AST: 21 IU/L (ref 0–40)
Albumin/Globulin Ratio: 1.7 (ref 1.2–2.2)
Albumin: 4.5 g/dL (ref 3.8–4.9)
Alkaline Phosphatase: 63 IU/L (ref 44–121)
BUN/Creatinine Ratio: 22 — ABNORMAL HIGH (ref 9–20)
BUN: 21 mg/dL (ref 6–24)
Bilirubin Total: 0.4 mg/dL (ref 0.0–1.2)
CO2: 23 mmol/L (ref 20–29)
Calcium: 9.5 mg/dL (ref 8.7–10.2)
Chloride: 102 mmol/L (ref 96–106)
Creatinine, Ser: 0.94 mg/dL (ref 0.76–1.27)
Globulin, Total: 2.7 g/dL (ref 1.5–4.5)
Glucose: 91 mg/dL (ref 70–99)
Potassium: 4.6 mmol/L (ref 3.5–5.2)
Sodium: 137 mmol/L (ref 134–144)
Total Protein: 7.2 g/dL (ref 6.0–8.5)
eGFR: 93 mL/min/{1.73_m2} (ref >59)

## 2022-08-04 LAB — CBC WITH DIFFERENTIAL/PLATELET
Basophils Absolute: 0 10*3/uL (ref 0.0–0.2)
Basos: 1 %
EOS (ABSOLUTE): 0.2 10*3/uL (ref 0.0–0.4)
Eos: 3 %
Hematocrit: 44.2 % (ref 37.5–51.0)
Hemoglobin: 15.1 g/dL (ref 13.0–17.7)
Immature Grans (Abs): 0 10*3/uL (ref 0.0–0.1)
Immature Granulocytes: 1 %
Lymphocytes Absolute: 1.6 10*3/uL (ref 0.7–3.1)
Lymphs: 25 %
MCH: 31 pg (ref 26.6–33.0)
MCHC: 34.2 g/dL (ref 31.5–35.7)
MCV: 91 fL (ref 79–97)
Monocytes Absolute: 0.6 10*3/uL (ref 0.1–0.9)
Monocytes: 10 %
Neutrophils Absolute: 3.9 10*3/uL (ref 1.4–7.0)
Neutrophils: 60 %
Platelets: 172 10*3/uL (ref 150–450)
RBC: 4.87 x10E6/uL (ref 4.14–5.80)
RDW: 13.2 % (ref 11.6–15.4)
WBC: 6.5 10*3/uL (ref 3.4–10.8)

## 2022-08-04 LAB — LIPID PANEL
Chol/HDL Ratio: 2.8 ratio (ref 0.0–5.0)
Cholesterol, Total: 121 mg/dL (ref 100–199)
HDL: 44 mg/dL (ref >39)
LDL Chol Calc (NIH): 63 mg/dL (ref 0–99)
Triglycerides: 67 mg/dL (ref 0–149)
VLDL Cholesterol Cal: 14 mg/dL (ref 5–40)

## 2022-08-05 ENCOUNTER — Ambulatory Visit (INDEPENDENT_AMBULATORY_CARE_PROVIDER_SITE_OTHER): Payer: BLUE CROSS/BLUE SHIELD

## 2022-08-05 DIAGNOSIS — Z23 Encounter for immunization: Secondary | ICD-10-CM | POA: Diagnosis not present

## 2022-08-05 NOTE — Progress Notes (Signed)
Tetanus vaccination status reviewed: Td vaccination indicated and given today.

## 2022-08-06 ENCOUNTER — Encounter: Payer: Self-pay | Admitting: Family Medicine

## 2022-08-06 NOTE — Progress Notes (Signed)
Established Patient Office Visit  Subjective    Patient ID: [name], male    DOB: [birth year]  Age: 57 y.o. MRN: [id number]  CC: No chief complaint on file.   HPI Aristidis Sandt presents for routine annual exam. Patient denies acute complaints or concerns.    Outpatient Encounter Medications as of 08/03/2022  Medication Sig   acetaminophen (TYLENOL) 500 MG tablet Take 500 mg by mouth every 6 (six) hours as needed for moderate pain.   amLODipine (NORVASC) 10 MG tablet Take 1 tablet (10 mg total) by mouth daily.   amLODipine (NORVASC) 5 MG tablet Take 5 mg by mouth daily.   apixaban (ELIQUIS) 5 MG TABS tablet Take 1 tablet (5 mg total) by mouth 2 (two) times daily.   atorvastatin (LIPITOR) 40 MG tablet Take 1 tablet (40 mg total) by mouth daily.   clopidogrel (PLAVIX) 75 MG tablet Take 75 mg by mouth daily.   ezetimibe (ZETIA) 10 MG tablet Take 1 tablet (10 mg total) by mouth daily.   fenofibrate (TRICOR) 145 MG tablet Take 1 tablet (145 mg total) by mouth daily.   finasteride (PROSCAR) 5 MG tablet TAKE 1 TABLET BY MOUTH EVERY DAY   FLUoxetine (PROZAC) 10 MG tablet Take 3 tablets (30 mg total) by mouth daily.   hydrochlorothiazide (HYDRODIURIL) 25 MG tablet Take 1 tablet (25 mg total) by mouth daily.   icosapent Ethyl (VASCEPA) 1 g capsule Take 2 capsules (2 g total) by mouth 2 (two) times daily.   isosorbide mononitrate (IMDUR) 60 MG 24 hr tablet Take 1.5 tablets (90 mg total) by mouth daily.   levocetirizine (XYZAL) 5 MG tablet Take 5 mg by mouth daily as needed for allergies.   lisinopril (ZESTRIL) 40 MG tablet Take 1 tablet (40 mg total) by mouth daily.   metoprolol succinate (TOPROL-XL) 50 MG 24 hr tablet Take 1 tablet (50 mg total) by mouth daily. Take with or immediately following a meal.   nitroGLYCERIN (NITROSTAT) 0.4 MG SL tablet Place 1 tablet (0.4 mg total) under the tongue every 5 (five) minutes x 3 doses as needed for chest pain.   pantoprazole (PROTONIX) 40 MG  tablet Take 1 tablet (40 mg total) by mouth daily.   Semaglutide-Weight Management 2.4 MG/0.75ML SOAJ Inject 2.4 mg into the skin once a week.   silodosin (RAPAFLO) 8 MG CAPS capsule Take 8 mg by mouth daily.   spironolactone (ALDACTONE) 25 MG tablet Take 1 tablet (25 mg total) by mouth daily.   No facility-administered encounter medications on file as of 08/03/2022.    Past Medical History:  Diagnosis Date   Anxiety    Depression    Hyperlipidemia    Hypertension    Lumbar herniated disc dx'd 03/2015   NSTEMI (non-ST elevated myocardial infarction) ([facility]) 04/15/2015    Past Surgical History:  Procedure Laterality Date   BACK SURGERY     CARDIAC CATHETERIZATION  04/15/2015   CARDIAC CATHETERIZATION [name] 04/15/2015   Procedure: Left Heart Cath and Coronary Angiography;  Surgeon: Runell Gess, MD;  Location: [facility] INVAS[facility] LAB;  Service: Cardiovascular;  Laterality: [name];   CARDIAC CATHETERIZATION [name] 05/20/2015   Procedure: Left Heart Cath and Cors/Grafts Angiography;  Surgeon: Peter M Swaziland, MD;  Location: [facility] INVASIVE CV LAB;  Service: Cardiovascular;  Laterality: [name];   CARDIOVERSION [name] 03/23/2022   Procedure: CARDIOVERSION;  Surgeon: Christell Constant, MD;  Location: [facility]OS[facility];  Service: Cardiovascular;  Laterality: [name];   CORONARY ARTERY BYPASS GRAFT [name]  04/18/2015   Procedure: CORONARY ARTERY BYPASS GRAFTING times four with LIMA  to LAD, LEFT RADIAL ARTERY to OM, Right saphenous vein harvesting and utilized to Interm and to PD ENDOSCOPIC GREATER SAPHENOUS VEIN HARVEST ([facility]) RIGHT THIGH;  Surgeon: Delight Ovens, MD;  Location: [facility] OR;  Service: Open Heart Surgery;  Laterality: [name];   LUMBAR LAMINECTOMY  2002   RADIAL ARTERY HARVEST Left 04/18/2015   Procedure:  LEFT Arm RADIAL ARTERY HARVEST;  Surgeon: Delight Ovens, MD;  Location: [facility];  Service: Open Heart Surgery;  Laterality: Left;   TEE WITHOUT CARDIOVERSION [name] 04/18/2015   Procedure: TRANSESOPHAGEAL ECHOCARDIOGRAM  (TEE);  Surgeon: Delight Ovens, MD;  Location: [facility] OR;  Service: Open Heart Surgery;  Laterality: [name];   TONSILLECTOMY  1960s    Family History  Problem Relation Age of Onset   Heart attack Father 79       Deceased at this age   Cancer Father        Lung   Diabetes Sister     Social History   Socioeconomic History   Marital status: Married    Spouse name: Not on file   Number of children: Not on file   Years of education: Not on file   Highest education level: Not on file  Occupational History   Not on file  Tobacco Use   Smoking status: Every Day    Packs/day: 1.00    Years: 6.00    Additional pack years: 0.00    Total pack years: 6.00    Types: Cigarettes   Smokeless tobacco: Never   Tobacco comments:    Currently smoke 1 pack daily 04/08/22  Vaping Use   Vaping Use: Never used  Substance and Sexual Activity   Alcohol use: Yes    Alcohol/week: 0.0 standard drinks of alcohol    Comment: occasionally   Drug use: Not Currently    Types: Marijuana, Cocaine    Comment: "in my teens"   Sexual activity: Not on file  Other Topics Concern   Not on file  Social History Narrative   Not on file   Social Determinants of Health   Financial Resource Strain: Not on file  Food Insecurity: No Food Insecurity (10/28/2021)   Hunger Vital Sign    Worried About Running Out of Food in the Last Year: Never true    Ran Out of Food in the Last Year: Never true  Transportation Needs: No Transportation Needs (10/28/2021)   PRAPARE - Administrator, Civil Service (Medical): No    Lack of Transportation (Non-Medical): No  Physical Activity: Not on file  Stress: Not on file  Social Connections: Not on file  Intimate Partner Violence: Not on file    Review of Systems  All other systems reviewed and are negative.       Objective    BP 115/79   Pulse 69   Temp 98.1 F (36.7 C) (Oral)   Resp 16   Ht 6' (1.829 m)   Wt 219 lb 12.8 oz (99.7 kg)   SpO2 97%   BMI  29.81 kg/m   Physical Exam Vitals and nursing note reviewed.  Constitutional:      General: He is not in acute distress. HENT:     Head: Normocephalic and atraumatic.     Right Ear: Tympanic membrane, ear canal and external ear normal.     Left Ear: Tympanic membrane, ear canal and external ear normal.  Nose: Nose normal.     Mouth/Throat:     Mouth: Mucous membranes are moist.     Pharynx: Oropharynx is clear.  Eyes:     Conjunctiva/sclera: Conjunctivae normal.     Pupils: Pupils are equal, round, and reactive to light.  Neck:     Thyroid: No thyromegaly.  Cardiovascular:     Rate and Rhythm: Normal rate and regular rhythm.     Heart sounds: Normal heart sounds. No murmur heard. Pulmonary:     Effort: Pulmonary effort is normal.     Breath sounds: Normal breath sounds.  Abdominal:     General: There is no distension.     Palpations: Abdomen is soft. There is no mass.     Tenderness: There is no abdominal tenderness.     Hernia: There is no hernia in the left inguinal area or right inguinal area.  Genitourinary:    Penis: Normal and circumcised.      Testes: Normal.  Musculoskeletal:        General: Normal range of motion.     Cervical back: Normal range of motion and neck supple.     Right lower leg: No edema.     Left lower leg: No edema.  Skin:    General: Skin is warm and dry.  Neurological:     General: No focal deficit present.     Mental Status: He is alert and oriented to person, place, and time. Mental status is at baseline.  Psychiatric:        Mood and Affect: Mood normal.        Behavior: Behavior normal.         Assessment & Plan:   1. Annual physical exam  - CMP14+EGFR  2. Anxiety and depression   3. Gastroesophageal reflux disease without esophagitis   4. Screening for colon cancer  - Ambulatory referral to Gastroenterology  5. Screening for lipid disorders  - Lipid Panel  6. Screening for deficiency anemia  - CBC with  Differential    Return in about 6 months (around 02/03/2023) for follow up.   Tommie Raymond, MD

## 2022-08-07 ENCOUNTER — Encounter: Payer: Self-pay | Admitting: Family Medicine

## 2022-08-13 ENCOUNTER — Other Ambulatory Visit: Payer: Self-pay | Admitting: Pharmacist

## 2022-08-13 MED ORDER — WEGOVY 2.4 MG/0.75ML ~~LOC~~ SOAJ: 2.4000 mg | SUBCUTANEOUS | 11 refills | Status: DC

## 2022-08-14 ENCOUNTER — Other Ambulatory Visit: Payer: Self-pay | Admitting: Family Medicine

## 2022-08-14 DIAGNOSIS — K219 Gastro-esophageal reflux disease without esophagitis: Secondary | ICD-10-CM

## 2022-08-15 NOTE — Progress Notes (Unsigned)
Cardiology Office Note:    Date:  08/15/2022   ID:  [name], DOB 1966/03/20, MRN [id number]  PCP:  Georganna Skeans, MD   [facility] Providers Cardiologist:  Tobias Alexander, MD {   Referring MD: Georganna Skeans, MD    History of Present Illness:    [name] is a 57 y.o. male with a hx of CAD s/p CABG x 4 04/18/2015 by Dr. Tyrone Sage (LIMA to LAD, Left radial artery to OM, SVG to Intermediate, and SVG to PD), post operative atrial fibrillation managed with amiodarone, HTN, depression and anxiety who was previously followed by Dr. Delton See who now returns to clinic for follow-up.   Patient has history of CAD s/p CABG in 2017 as detailed above.  1 month after his CABG, he was readmitted 04/2015 for recurrent CP and hypertensive urgency. Enzyme elevation was c/w NSTEMI. Subsequently, he underwent repeat LHC which demonstrated occlusion of the SVG to the ramus intermedius. The native vessel had moderate ostial stenosis but with TIMI 3 flow without other obstructive disease. It is possible that with occlusion of the SVG there may have been some thrombus showered downstream. His remaining 3 grafts were patent. Medical management was recommended. He was continued on ASA, a statin, BB and ARB. Plavix and a long acting nitrate were added to his regimen. He was also prescribed PRN SL NTG. He did well with medical therapy.  Patient was last seen in clinic on 07/07/20 where he was having more dyspnea on exertion and intermittent exertional chest pain. Myoview 07/16/20 with no evidence of ischemia or infarction. EF 47%. Lung cancer screening CT with no nodules, underlying CAD and aortic sclerosis and COPD.  Last seen in clinic on 01/2022. Was in Afib at that visit. Was started on [facility] and ultimately underwent DCCV in 03/2022.  Today, ***  Past Medical History:  Diagnosis Date   Anxiety    Depression    Hyperlipidemia    Hypertension    Lumbar herniated disc dx'd 03/2015   NSTEMI (non-ST  elevated myocardial infarction) (HCC) 04/15/2015    Past Surgical History:  Procedure Laterality Date   BACK SURGERY     CARDIAC CATHETERIZATION  04/15/2015   CARDIAC CATHETERIZATION [name] 04/15/2015   Procedure: Left Heart Cath and Coronary Angiography;  Surgeon: Runell Gess, MD;  Location: [facility] INVASIVE CV LAB;  Service: Cardiovascular;  Laterality: [name];   CARDIAC CATHETERIZATION [name] 05/20/2015   Procedure: Left Heart Cath and Cors/Grafts Angiography;  Surgeon: Peter M Swaziland, MD;  Location: [facility] INVASIVE CV LAB;  Service: Cardiovascular;  Laterality: [name];   CARDIOVERSION [name] 03/23/2022   Procedure: CARDIOVERSION;  Surgeon: Christell Constant, MD;  Location: [facility] ENDOSCOPY;  Service: Cardiovascular;  Laterality: [name];   CORONARY ARTERY BYPASS GRAFT [name] 04/18/2015   Procedure: CORONARY ARTERY BYPASS GRAFTING times four with LIMA  to LAD, LEFT RADIAL ARTERY to OM, Right saphenous vein harvesting and utilized to Interm and to PD ENDOSCOPIC GREATER SAPHENOUS VEIN HARVEST (EVH) RIGHT THIGH;  Surgeon: Delight Ovens, MD;  Location: [facility] OR;  Service: Open Heart Surgery;  Laterality: [name];   LUMBAR LAMINECTOMY  2002   RADIAL ARTERY HARVEST Left 04/18/2015   Procedure:  LEFT Arm RADIAL ARTERY HARVEST;  Surgeon: Delight Ovens, MD;  Location: [facility] OR;  Service: Open Heart Surgery;  Laterality: Left;   TEE WITHOUT CARDIOVERSION [name] 04/18/2015   Procedure: TRANSESOPHAGEAL ECHOCARDIOGRAM (TEE);  Surgeon: Delight Ovens, MD;  Location: [facility] OR;  Service: Open Heart Surgery;  Laterality:  [name];   TONSILLECTOMY  1960s    Current Medications: No outpatient medications have been marked as taking for the 08/18/22 encounter (Appointment) with Meriam Sprague, MD.     Allergies:   Atorvastatin, Claritin [loratadine], Codeine, and Vicodin [hydrocodone-acetaminophen]   Social History   Socioeconomic History   Marital status: Married    Spouse name: Not on file   Number of children: Not on file   Years of  education: Not on file   Highest education level: Not on file  Occupational History   Not on file  Tobacco Use   Smoking status: Every Day    Packs/day: 1.00    Years: 6.00    Additional pack years: 0.00    Total pack years: 6.00    Types: Cigarettes   Smokeless tobacco: Never   Tobacco comments:    Currently smoke 1 pack daily 04/08/22  Vaping Use   Vaping Use: Never used  Substance and Sexual Activity   Alcohol use: Yes    Alcohol/week: 0.0 standard drinks of alcohol    Comment: occasionally   Drug use: Not Currently    Types: Marijuana, Cocaine    Comment: "in my teens"   Sexual activity: Not on file  Other Topics Concern   Not on file  Social History Narrative   Not on file   Social Determinants of Health   Financial Resource Strain: Not on file  Food Insecurity: No Food Insecurity (10/28/2021)   Hunger Vital Sign    Worried About Running Out of Food in the Last Year: Never true    Ran Out of Food in the Last Year: Never true  Transportation Needs: No Transportation Needs (10/28/2021)   PRAPARE - Administrator, Civil Service (Medical): No    Lack of Transportation (Non-Medical): No  Physical Activity: Not on file  Stress: Not on file  Social Connections: Not on file     Family History: The patient's family history includes Cancer in his father; Diabetes in his sister; Heart attack (age of onset: 1) in his father.  ROS:   Please see the history of present illness.    Review of Systems  Constitutional:  Negative for chills and fever.  HENT:  Negative for nosebleeds and tinnitus.   Eyes:  Negative for blurred vision and pain.  Respiratory:  Negative for cough, hemoptysis, shortness of breath and stridor.   Cardiovascular:  Negative for chest pain, palpitations, orthopnea, claudication, leg swelling and PND.  Gastrointestinal:  Negative for blood in stool, diarrhea, nausea and vomiting.  Genitourinary:  Negative for dysuria and hematuria.   Musculoskeletal:  Negative for falls.  Neurological:  Negative for dizziness, loss of consciousness and headaches.       + lightheadedness  Endo/Heme/Allergies:  Bruises/bleeds easily.  Psychiatric/Behavioral:  Negative for depression, hallucinations and substance abuse. The patient does not have insomnia.      EKGs/Labs/Other Studies Reviewed:    The following studies were reviewed today: Carotid Arterial Duplex 10/22/20 Right Carotid: Velocities in the right ICA are consistent with a 1-39%  stenosis. Decrease ICA velocities compared to prior exam.   Left Carotid: Velocities in the left ICA are consistent with a 1-39%  stenosis. Decrease ICA velocities compared to prior exam.   Vertebrals:  Left vertebral artery demonstrates antegrade flow. Small  caliber right vertebral artery with atypical antegrade flow.   Subclavians: Normal flow hemodynamics were seen in bilateral subclavian arteries.   Myoview 07/16/20: Nuclear stress EF: 47%. There  was no ST segment deviation noted during stress. The study is normal. This is a low risk study.   Low risk stress nuclear study with normal perfusion and mildly reduced global systolic function. Consider echo correlation.  TTE 07/18/19:  1. Inferior basal hypokinesis . Left ventricular ejection fraction, by  estimation, is 60%. The left ventricle has normal function. The left  ventricle has no regional wall motion abnormalities. Left ventricular  diastolic parameters were normal.   2. Right ventricular systolic function is normal. The right ventricular  size is normal.   3. Left atrial size was moderately dilated.   4. Right atrial size was moderately dilated.   5. The mitral valve is normal in structure. No evidence of mitral valve  regurgitation. No evidence of mitral stenosis.   6. The aortic valve is normal in structure. Aortic valve regurgitation is  not visualized. No aortic stenosis is present.   7. The inferior vena cava is normal  in size with greater than 50%  respiratory variability, suggesting right atrial pressure of 3 mmHg.  CT lung caner screen 07/16/20: IMPRESSION: 1. Lung-RADS 2, benign appearance or behavior. Continue annual screening with low-dose chest CT without contrast in 12 months. 2. There is aortic atherosclerosis, in addition to left main and 3 vessel coronary artery disease. Status post median sternotomy for CABG including LIMA to the LAD. 3. Mild diffuse bronchial wall thickening with very mild centrilobular and paraseptal emphysema; imaging findings suggestive of underlying COPD.   Aortic Atherosclerosis (ICD10-I70.0) and Emphysema (ICD10-J43.9).   TTE: 04/2015 - Left ventricle: The cavity size was normal. Wall thickness was   increased in a pattern of mild LVH. Basal inferior severe   hypokinesis, inferolateral hypokinesis. Systolic function was low   normal to mildly reduced. The estimated ejection fraction was in   the range of 50% to 55%. Doppler parameters are consistent with   abnormal left ventricular relaxation (grade 1 diastolic   dysfunction). - Aortic valve: There was no stenosis. - Ascending aorta: The ascending aorta was mildly dilated at 3.7   cm. - Mitral valve: There was no significant regurgitation. - Left atrium: The atrium was mildly dilated. - Right ventricle: Poorly visualized. - Pulmonary arteries: No complete TR doppler jet so unable to   estimate PA systolic pressure. - Inferior vena cava: The vessel was normal in size. The   respirophasic diameter changes were in the normal range (>= 50%),   consistent with normal central venous pressure. - Pericardium, extracardiac: A trivial pericardial effusion was   identified.   Cath 2017: Mid Cx lesion, 95% stenosed. Mid Cx to Dist Cx lesion, 95% stenosed. Prox LAD to Mid LAD lesion, 50% stenosed. Ost Ramus lesion, 60% stenosed. Prox Cx lesion, 95% stenosed. Prox RCA-1 lesion, 75% stenosed. Prox RCA-2 lesion, 80%  stenosed. Mid RCA lesion, 90% stenosed. Dist RCA lesion, 60% stenosed. RPDA lesion, 60% stenosed. SVG to the PDA was injected is very large, and is anatomically normal. There is competitive flow. SVG to the ramus intermediate was injected is very large. Mid Graft to Insertion lesion, 100% stenosed. Radial artery graft to the OM was injected is normal in caliber, and is anatomically normal. There is competitive flow. LIMA to the LAD was injected is normal in caliber, and is anatomically normal. The left ventricular systolic function is normal.   1. Severe 3 vessel obstructive CAD 2. Patent LIMA to the LAD 3. Patent free radial graft to the OM. This arises from the hood of the  SVG to the ramus intermediate branch 4. Occluded mid SVG to the ramus intermediate with thrombus. 5. Patent SVG to the PDA 6. Good LV function.   Plan: It appears that the culprit lesion is occlusion of the SVG to the ramus intermediate. The native vessel does have moderate ostial stenosis but has TIMI 3 flow without other obstructive disease. It is possible that with occlusion of the SVG there may have been some thrombus showered downstream. At this point would treat medically.   EKG:  ***  Recent Labs: 08/03/2022: ALT 26; BUN 21; Creatinine, Ser 0.94; Hemoglobin 15.1; Platelets 172; Potassium 4.6; Sodium 137  Recent Lipid Panel    Component Value Date/Time   CHOL 121 08/03/2022 1040   TRIG 67 08/03/2022 1040   HDL 44 08/03/2022 1040   CHOLHDL 2.8 08/03/2022 1040   CHOLHDL 3.8 05/20/2015 0514   VLDL 22 05/20/2015 0514   LDLCALC 63 08/03/2022 1040         Physical Exam:    VS:  There were no vitals taken for this visit.    Wt Readings from Last 3 Encounters:  08/03/22 219 lb 12.8 oz (99.7 kg)  04/08/22 211 lb 6.4 oz (95.9 kg)  03/23/22 208 lb (94.3 kg)     GEN:  Well nourished, well developed in no acute distress HEENT: Normal NECK: No JVD; No carotid bruits CARDIAC: Irregularly irregular, no  murmurs, rubs or gallops RESPIRATORY:  Clear to auscultation without rales, wheezing or rhonchi  ABDOMEN: Soft, non-tender, non-distended MUSCULOSKELETAL:  No edema; No deformity  SKIN: Warm and dry NEUROLOGIC:  Alert and oriented x 3 PSYCHIATRIC:  Normal affect   ASSESSMENT:    No diagnosis found.    PLAN:    In order of problems listed above:  #Paroxysmal Afib: CHADs-vasc 2 on apixaban. Underwent DCCV in 03/2022 with return to NSR. Remains in NSR today. -Continue apixaban 5mg  BID -Continue metop 50mg  XL daily  #Multivessel CAD s/p CABG x4 in 2016: Patient with NSTEMI in 03/2014 treated with CABG x 4 as detailed above. He had a second NSTEMI 04/2015 with cath revealing a failed SVG to RI. His other 3 grafts were patent.  LVEF low normal in 2017 with mild regional wall motion abnormalities now with LVEF 60% on TTE 06/2019 with inferobasal hypokinesis. Recent myoview 06/2020 without evidence of ischemia or infarction. Currently doing well with no anginal symptoms. -Myoview 06/2020 negative for ischemia -Not on ASA due to need for [facility] -Continue lipitor 40mg  daily and zetia 10mg  daily -Continue lisinopril 40mg  daily -Continue bystolic 10mg  daily -Continue imdur 90mg  daily  #HTN: *** -Continue lisinopril 40mg  daily -Continue bystolic 10mg  daily -Continue amlodipine 5mg  daily -Continue imdur 90mg  daily   #Mixed Hyperlipidemia: Well controlled in 05/2021. -Continue lipitor 40mg  daily and zetia 10mg  daily   #Tobacco Abuse:  Continues to smoke about 1/2 pack per day. -Encourage complete cessation -Encouraged nicotine patches -Needs yearly CT screening' last 06/2021 without nodules  #Mild Carotid Artery Disease: 1-39% bilaterally in 2022. -Repeat carotid ultrasiund in 2025 -Continue ASA, zetia and lipitor as above   #Obesity: BMI down to 29! Tolerating wegovy. -Continue wegovy -Continue healthy diet and weight loss efforts as detailed below   Exercise recommendations: Goal  of exercising for at least 30 minutes a day, at least 5 times per week.  Please exercise to a moderate exertion.  This means that while exercising it is difficult to speak in full sentences, however you are not so short of breath that you  feel you must stop, and not so comfortable that you can carry on a full conversation.  Exertion level should be approximately a 5/10, if 10 is the most exertion you can perform.   Diet recommendations: Recommend a heart healthy diet such as the Mediterranean diet.  This diet consists of plant based foods, healthy fats, lean meats, olive oil.  It suggests limiting the intake of simple carbohydrates such as white breads, pastries, and pastas.  It also limits the amount of red meat, wine, and dairy products such as cheese that one should consume on a daily basis.     Medication Adjustments/Labs and Tests Ordered: Current medicines are reviewed at length with the patient today.  Concerns regarding medicines are outlined above.  No orders of the defined types were placed in this encounter.  No orders of the defined types were placed in this encounter.    There are no Patient Instructions on file for this visit.    Signed, Meriam Sprague, MD  08/15/2022 8:45 PM    [facility]

## 2022-08-17 ENCOUNTER — Other Ambulatory Visit: Payer: Self-pay

## 2022-08-17 ENCOUNTER — Encounter: Payer: Self-pay | Admitting: Nurse Practitioner

## 2022-08-17 MED ORDER — EZETIMIBE 10 MG PO TABS: 10.0000 mg | ORAL_TABLET | Freq: Every day | ORAL | 1 refills | Status: DC

## 2022-08-17 NOTE — Telephone Encounter (Signed)
Requested Prescriptions  Pending Prescriptions Disp Refills   pantoprazole (PROTONIX) 40 MG tablet [Pharmacy Med Name: PANTOPRAZOLE SOD DR 40 MG TAB] 90 tablet 0    Sig: TAKE 1 TABLET BY MOUTH EVERY DAY     Gastroenterology: Proton Pump Inhibitors Passed - 08/14/2022  8:40 AM      Passed - Valid encounter within last 12 months    Recent Outpatient Visits           2 weeks ago Annual physical exam   [facility], MD   6 months ago Gastroesophageal reflux disease without esophagitis   [facility], MD   1 year ago Anxiety and depression   [facility], MD   1 year ago Essential hypertension   [facility], MD       Future Appointments             Tomorrow Meriam Sprague, MD [facility], [facility]

## 2022-08-18 ENCOUNTER — Telehealth: Payer: Self-pay | Admitting: *Deleted

## 2022-08-18 ENCOUNTER — Encounter: Payer: Self-pay | Admitting: Cardiology

## 2022-08-18 ENCOUNTER — Encounter: Payer: Self-pay | Admitting: *Deleted

## 2022-08-18 ENCOUNTER — Ambulatory Visit: Payer: BLUE CROSS/BLUE SHIELD | Attending: Cardiology | Admitting: Cardiology

## 2022-08-18 VITALS — BP 108/74 | HR 74 | Ht 72.0 in | Wt 224.0 lb

## 2022-08-18 DIAGNOSIS — D6869 Other thrombophilia: Secondary | ICD-10-CM | POA: Diagnosis not present

## 2022-08-18 DIAGNOSIS — R002 Palpitations: Secondary | ICD-10-CM

## 2022-08-18 DIAGNOSIS — I2510 Atherosclerotic heart disease of native coronary artery without angina pectoris: Secondary | ICD-10-CM

## 2022-08-18 DIAGNOSIS — Z720 Tobacco use: Secondary | ICD-10-CM

## 2022-08-18 DIAGNOSIS — Z951 Presence of aortocoronary bypass graft: Secondary | ICD-10-CM

## 2022-08-18 DIAGNOSIS — I10 Essential (primary) hypertension: Secondary | ICD-10-CM

## 2022-08-18 DIAGNOSIS — I4819 Other persistent atrial fibrillation: Secondary | ICD-10-CM

## 2022-08-18 DIAGNOSIS — I779 Disorder of arteries and arterioles, unspecified: Secondary | ICD-10-CM

## 2022-08-18 NOTE — Patient Instructions (Addendum)
Medication Instructions:   STOP TAKING AMLODIPINE NOW  *If you need a refill on your cardiac medications before your next appointment, please call your pharmacy*    Testing/Procedures:   CT CHEST LUNG CA SCREEN LOW DOSE W/O CM NEEDS TO BE SCHEDULED NOW--ORIGINALLY SCHEDULED FOR 07/19/22 AND IMAGING CENTER CANCELLED THE APPOINTMENT AND NEVER RESCHEDULED THE PATIENT.  SCHEDULING PLEASE ARRANGE     Follow-Up: At [facility], you and your health needs are our priority.  As part of our continuing mission to provide you with exceptional heart care, we have created designated Provider Care Teams.  These Care Teams include your primary Cardiologist (physician) and Advanced Practice Providers (APPs -  Physician Assistants and Nurse Practitioners) who all work together to provide you with the care you need, when you need it.  We recommend signing up for the patient portal called "MyChart".  Sign up information is provided on this After Visit Summary.  MyChart is used to connect with patients for Virtual Visits (Telemedicine).  Patients are able to view lab/test results, encounter notes, upcoming appointments, etc.  Non-urgent messages can be sent to your provider as well.   To learn more about what you can do with MyChart, go to [email or web address].    Your next appointment:   6 month(s)  Provider:   DR. MARK SKAINS

## 2022-08-18 NOTE — Telephone Encounter (Signed)
-----   Message from Gerome Sam sent at 08/18/2022  9:05 AM EDT ----- Regarding: REFERRAL CT Lung scan scheduled on 10/13/22.  Thanks

## 2022-08-18 NOTE — Progress Notes (Signed)
Cardiology Office Note:    Date:  08/18/2022   ID:  [name], DOB [birth year], MRN [id number]  PCP:  Georganna Skeans, MD   [facility] Providers Cardiologist:  Tobias Alexander, MD {   Referring MD: Georganna Skeans, MD    History of Present Illness:    [name] is a 58 y.o. male with a hx of CAD s/p CABG x 4 04/18/2015 by Dr. Tyrone Sage (LIMA to LAD, Left radial artery to OM, SVG to Intermediate, and SVG to PD), post operative atrial fibrillation managed with amiodarone, HTN, depression and anxiety who was previously followed by Dr. Delton See who now returns to clinic for follow-up.   Patient has history of CAD s/p CABG in 2017 as detailed above.  1 month after his CABG, he was readmitted 04/2015 for recurrent CP and hypertensive urgency. Enzyme elevation was c/w NSTEMI. Subsequently, he underwent repeat LHC which demonstrated occlusion of the SVG to the ramus intermedius. The native vessel had moderate ostial stenosis but with TIMI 3 flow without other obstructive disease. It is possible that with occlusion of the SVG there may have been some thrombus showered downstream. His remaining 3 grafts were patent. Medical management was recommended. He was continued on ASA, a statin, BB and ARB. Plavix and a long acting nitrate were added to his regimen. He was also prescribed PRN SL NTG. He did well with medical therapy.  Patient was last seen in clinic on 07/07/20 where he was having more dyspnea on exertion and intermittent exertional chest pain. Myoview 07/16/20 with no evidence of ischemia or infarction. EF 47%. Lung cancer screening CT with no nodules, underlying CAD and aortic sclerosis and COPD.  Last seen in clinic on 01/2022. Was in Afib at that visit. Was started on [facility] and ultimately underwent DCCV in 03/2022.  Today, the patient is doing overall well. States he has been having palpitations whenever he get's really upset, lasting a few hours at a time. No associated chest pain,  SOB, lightheadedness or dizziness. States symptoms do not occur unless he is upset or anxious.  Also notes that his BP get's too low with the amlodipine in the setting of 80lbs weight loss. Has never had a syncopal event but felt presyncopal while on medicine.   Otherwise, he is doing well with no chest pain, SOB, orthopnea, PND, or LE edema. Compliant with medications. No exertional symptoms.  Past Medical History:  Diagnosis Date   Anxiety    Depression    Hyperlipidemia    Hypertension    Lumbar herniated disc dx'd 03/2015   NSTEMI (non-ST elevated myocardial infarction) (HCC) 04/15/2015    Past Surgical History:  Procedure Laterality Date   BACK SURGERY     CARDIAC CATHETERIZATION  04/15/2015   CARDIAC CATHETERIZATION [name] 04/15/2015   Procedure: Left Heart Cath and Coronary Angiography;  Surgeon: Runell Gess, MD;  Location: [facility] INVASIVE CV LAB;  Service: Cardiovascular;  Laterality: [name];   CARDIAC CATHETERIZATION [name] 05/20/2015   Procedure: Left Heart Cath and Cors/Grafts Angiography;  Surgeon: Peter M Swaziland, MD;  Location: [facility] INVASIVE CV LAB;  Service: Cardiovascular;  Laterality: [name];   CARDIOVERSION [name] 03/23/2022   Procedure: CARDIOVERSION;  Surgeon: Christell Constant, MD;  Location: [facility] ENDOSCOPY;  Service: Cardiovascular;  Laterality: [name];   CORONARY ARTERY BYPASS GRAFT [name] 04/18/2015   Procedure: CORONARY ARTERY BYPASS GRAFTING times four with LIMA  to LAD, LEFT RADIAL ARTERY to OM, Right saphenous vein harvesting and utilized to Interm and to PD  ENDOSCOPIC GREATER SAPHENOUS VEIN HARVEST (EVH) RIGHT THIGH;  Surgeon: Delight Ovens, MD;  Location: [facility] OR;  Service: Open Heart Surgery;  Laterality: [name];   LUMBAR LAMINECTOMY  2002   RADIAL ARTERY HARVEST Left 04/18/2015   Procedure:  LEFT Arm RADIAL ARTERY HARVEST;  Surgeon: Delight Ovens, MD;  Location: [facility] OR;  Service: Open Heart Surgery;  Laterality: Left;   TEE WITHOUT CARDIOVERSION [name] 04/18/2015   Procedure:  TRANSESOPHAGEAL ECHOCARDIOGRAM (TEE);  Surgeon: Delight Ovens, MD;  Location: [facility] OR;  Service: Open Heart Surgery;  Laterality: [name];   TONSILLECTOMY  1960s    Current Medications: Current Meds  Medication Sig   acetaminophen (TYLENOL) 500 MG tablet Take 500 mg by mouth every 6 (six) hours as needed for moderate pain.   apixaban (ELIQUIS) 5 MG TABS tablet Take 1 tablet (5 mg total) by mouth 2 (two) times daily.   atorvastatin (LIPITOR) 40 MG tablet Take 1 tablet (40 mg total) by mouth daily.   clopidogrel (PLAVIX) 75 MG tablet Take 75 mg by mouth daily.   ezetimibe (ZETIA) 10 MG tablet Take 1 tablet (10 mg total) by mouth daily.   fenofibrate (TRICOR) 145 MG tablet Take 1 tablet (145 mg total) by mouth daily.   finasteride (PROSCAR) 5 MG tablet TAKE 1 TABLET BY MOUTH EVERY DAY   FLUoxetine (PROZAC) 10 MG tablet Take 3 tablets (30 mg total) by mouth daily.   hydrochlorothiazide (HYDRODIURIL) 25 MG tablet Take 1 tablet (25 mg total) by mouth daily.   icosapent Ethyl (VASCEPA) 1 g capsule Take 2 capsules (2 g total) by mouth 2 (two) times daily.   isosorbide mononitrate (IMDUR) 60 MG 24 hr tablet Take 1.5 tablets (90 mg total) by mouth daily.   levocetirizine (XYZAL) 5 MG tablet Take 5 mg by mouth daily as needed for allergies.   lisinopril (ZESTRIL) 40 MG tablet Take 1 tablet (40 mg total) by mouth daily.   metoprolol succinate (TOPROL-XL) 50 MG 24 hr tablet Take 1 tablet (50 mg total) by mouth daily. Take with or immediately following a meal.   nitroGLYCERIN (NITROSTAT) 0.4 MG SL tablet Place 1 tablet (0.4 mg total) under the tongue every 5 (five) minutes x 3 doses as needed for chest pain.   pantoprazole (PROTONIX) 40 MG tablet TAKE 1 TABLET BY MOUTH EVERY DAY   silodosin (RAPAFLO) 8 MG CAPS capsule Take 8 mg by mouth daily.   spironolactone (ALDACTONE) 25 MG tablet Take 1 tablet (25 mg total) by mouth daily.   WEGOVY 2.4 MG/0.75ML SOAJ Inject 2.4 mg into the skin once a week.    [DISCONTINUED] amLODipine (NORVASC) 10 MG tablet Take 1 tablet (10 mg total) by mouth daily.   [DISCONTINUED] amLODipine (NORVASC) 5 MG tablet Take 5 mg by mouth daily.     Allergies:   Atorvastatin, Claritin [loratadine], Codeine, and Vicodin [hydrocodone-acetaminophen]   Social History   Socioeconomic History   Marital status: Married    Spouse name: Not on file   Number of children: Not on file   Years of education: Not on file   Highest education level: Not on file  Occupational History   Not on file  Tobacco Use   Smoking status: Every Day    Packs/day: 1.00    Years: 6.00    Additional pack years: 0.00    Total pack years: 6.00    Types: Cigarettes   Smokeless tobacco: Never   Tobacco comments:    Currently smoke 1 pack  daily 04/08/22  Vaping Use   Vaping Use: Never used  Substance and Sexual Activity   Alcohol use: Yes    Alcohol/week: 0.0 standard drinks of alcohol    Comment: occasionally   Drug use: Not Currently    Types: Marijuana, Cocaine    Comment: "in my teens"   Sexual activity: Not on file  Other Topics Concern   Not on file  Social History Narrative   Not on file   Social Determinants of Health   Financial Resource Strain: Not on file  Food Insecurity: No Food Insecurity (10/28/2021)   Hunger Vital Sign    Worried About Running Out of Food in the Last Year: Never true    Ran Out of Food in the Last Year: Never true  Transportation Needs: No Transportation Needs (10/28/2021)   PRAPARE - Administrator, Civil Service (Medical): No    Lack of Transportation (Non-Medical): No  Physical Activity: Not on file  Stress: Not on file  Social Connections: Not on file     Family History: The patient's family history includes Cancer in his father; Diabetes in his sister; Heart attack (age of onset: 43) in his father.  ROS:   As per HPI  EKGs/Labs/Other Studies Reviewed:    The following studies were reviewed today: Cardiac Studies &  Procedures   CARDIAC CATHETERIZATION  CARDIAC CATHETERIZATION 05/20/2015  Narrative  Mid Cx lesion, 95% stenosed.  Mid Cx to Dist Cx lesion, 95% stenosed.  Prox LAD to Mid LAD lesion, 50% stenosed.  Ost Ramus lesion, 60% stenosed.  Prox Cx lesion, 95% stenosed.  Prox RCA-1 lesion, 75% stenosed.  Prox RCA-2 lesion, 80% stenosed.  Mid RCA lesion, 90% stenosed.  Dist RCA lesion, 60% stenosed.  RPDA lesion, 60% stenosed.  SVG to the PDA was injected is very large, and is anatomically normal.  There is competitive flow.  SVG to the ramus intermediate was injected is very large.  Mid Graft to Insertion lesion, 100% stenosed.  Radial artery graft to the OM was injected is normal in caliber, and is anatomically normal.  There is competitive flow.  LIMA to the LAD was injected is normal in caliber, and is anatomically normal.  The left ventricular systolic function is normal.  1. Severe 3 vessel obstructive CAD 2. Patent LIMA to the LAD 3. Patent free radial graft to the OM. This arises from the hood of the SVG to the ramus intermediate branch 4. Occluded mid SVG to the ramus intermediate with thrombus. 5. Patent SVG to the PDA 6. Good LV function.  Plan: It appears that the culprit lesion is occlusion of the SVG to the ramus intermediate. The native vessel does have moderate ostial stenosis but has TIMI 3 flow without other obstructive disease. It is possible that with occlusion of the SVG there may have been some thrombus showered downstream. At this point would treat medically.  Findings Coronary Findings Diagnostic  Dominance: Right  Left Anterior Descending Calcified.  Ramus Intermedius  Left Circumflex Calcified. Calcified.  Right Coronary Artery Calcified. Calcified. Calcified. Calcified.  Right Posterior Descending Artery Calcified.  Single Graft Graft To RPDA SVG was injected is very large, and is anatomically normal. There is competitive  flow.  Single Graft Graft To Ramus SVG was injected is very large. With heavy thrombus.  Radial Artery Graft To 3rd Mrg Radial artery was injected is normal in caliber, and is anatomically normal. There is competitive flow.  LIMA LIMA Graft To Dist  LAD LIMA was injected is normal in caliber, and is anatomically normal.  Intervention  No interventions have been documented.   CARDIAC CATHETERIZATION  CARDIAC CATHETERIZATION 04/15/2015  Narrative Images from the original result were not included.  Mid Cx lesion, 95% stenosed.  Mid Cx to Dist Cx lesion, 95% stenosed.  Prox LAD to Mid LAD lesion, 50% stenosed.  Ost Ramus lesion, 60% stenosed.  Prox Cx lesion, 95% stenosed.  Prox RCA-1 lesion, 75% stenosed.  Prox RCA-2 lesion, 80% stenosed.  Mid RCA lesion, 90% stenosed.  Dist RCA lesion, 60% stenosed.  RPDA lesion, 60% stenosed.  The left ventricular systolic function is normal.  Avanti Trudeau is a 57 y.o. male   [id number] LOCATION:  FACILITY: [facility] PHYSICIAN: [name], M.D. 1965/04/17   DATE OF PROCEDURE:  04/15/2015  DATE OF DISCHARGE:     CARDIAC CATHETERIZATION    History obtained from chart review. Mister Courtwright is a 57 year-old marriedCaucasian male with a history of tobacco abuse, hypertension and strong family history of heart disease. His father died of a massive myocardial infarction at age 27. He has had chest pain over the last 12 months and developed prolonged chest pain today. He was seen in the emergency room where his EKG showed no acute changes His pain improved with sublingual nitroglycerin. His troponins were mildly elevated. He presents now for coronary angiography..    IMPRESSION:Mr. Fasching has highly calcified three-vessel disease probably best treated with coronary artery bypass grafting He has preserved LV function. TCTS has been consulted. The sheath was removed and a TR band was placed on the right wrist to  achieve patent hemostasis.. The patient left the lab in stable condition.    [name]. MD, [facility] 04/15/2015 3:53 PM  Findings Coronary Findings Diagnostic  Dominance: Right  Left Anterior Descending Calcified.  Ramus Intermedius  Left Circumflex Calcified. Calcified.  Right Coronary Artery Calcified. Calcified. Calcified. Calcified.  Right Posterior Descending Artery Calcified.  Intervention  No interventions have been documented.   STRESS TESTS  MYOCARDIAL PERFUSION IMAGING 07/16/2020  Narrative  Nuclear stress EF: 47%.  There was no ST segment deviation noted during stress.  The study is normal.  This is a low risk study.  Low risk stress nuclear study with normal perfusion and mildly reduced global systolic function. Consider echo correlation.   ECHOCARDIOGRAM  ECHOCARDIOGRAM COMPLETE 03/08/2022  Narrative ECHOCARDIOGRAM REPORT    Patient Name:   JAKOBIE SUTPHEN Date of Exam: 03/08/2022 Medical Rec #:  [id number]        Height:       71.0 in Accession #:    [id number]       Weight:       210.0 lb Date of Birth:  Jun 19, 1965        BSA:          2.153 m Patient Age:    56 years         BP:           104/82 mmHg Patient Gender: M                HR:           56 bpm. Exam Location:  [address]  Procedure: 2D Echo, 3D Echo, Cardiac Doppler and Color Doppler  Indications:    I48.91 Atrial Fibrillation  History:        Patient has prior history of Echocardiogram examinations, most recent 07/18/2019. Risk Factors:Hypertension and Dyslipidemia. NSTEMI. Anxiety.  Sonographer:  NaTashia Rodgers-Jones RDCS Referring Phys: [id number] Nastasia Kage E Taiya Nutting  IMPRESSIONS   1. Left ventricular ejection fraction, by estimation, is 60 to 65%. The left ventricle has normal function. The left ventricle has no regional wall motion abnormalities. Left ventricular diastolic parameters are indeterminate. 2. Right ventricular systolic function is normal.  The right ventricular size is normal. 3. Left atrial size was mildly dilated. 4. Right atrial size was mildly dilated. 5. The mitral valve is normal in structure. Mild mitral valve regurgitation. 6. The aortic valve is tricuspid. Aortic valve regurgitation is not visualized. Aortic valve sclerosis/calcification is present, without any evidence of aortic stenosis. 7. The inferior vena cava is dilated in size with <50% respiratory variability, suggesting right atrial pressure of 15 mmHg.  FINDINGS Left Ventricle: Left ventricular ejection fraction, by estimation, is 60 to 65%. The left ventricle has normal function. The left ventricle has no regional wall motion abnormalities. The left ventricular internal cavity size was normal in size. There is no left ventricular hypertrophy. Left ventricular diastolic parameters are indeterminate.  Right Ventricle: The right ventricular size is normal. Right vetricular wall thickness was not assessed. Right ventricular systolic function is normal.  Left Atrium: Left atrial size was mildly dilated.  Right Atrium: Right atrial size was mildly dilated.  Pericardium: There is no evidence of pericardial effusion.  Mitral Valve: The mitral valve is normal in structure. Mild mitral valve regurgitation.  Tricuspid Valve: The tricuspid valve is normal in structure. Tricuspid valve regurgitation is trivial.  Aortic Valve: The aortic valve is tricuspid. Aortic valve regurgitation is not visualized. Aortic valve sclerosis/calcification is present, without any evidence of aortic stenosis.  Pulmonic Valve: The pulmonic valve was thickened with good excursion. Pulmonic valve regurgitation is trivial.  Aorta: The aortic root and ascending aorta are structurally normal, with no evidence of dilitation.  Venous: The inferior vena cava is dilated in size with less than 50% respiratory variability, suggesting right atrial pressure of 15 mmHg.  IAS/Shunts: No atrial level  shunt detected by color flow Doppler.   LEFT VENTRICLE PLAX 2D LVIDd:         5.10 cm   Diastology LVIDs:         3.40 cm   LV e' medial:    9.14 cm/s LV PW:         1.00 cm   LV E/e' medial:  12.0 LV IVS:        1.00 cm   LV e' lateral:   12.67 cm/s LVOT diam:     2.20 cm   LV E/e' lateral: 8.7 LV SV:         63 LV SV Index:   29 LVOT Area:     3.80 cm  3D Volume EF: 3D EF:        54 % LV EDV:       171 ml LV ESV:       78 ml LV SV:        92 ml  RIGHT VENTRICLE            IVC RV Basal diam:  4.30 cm    IVC diam: 2.20 cm RV S prime:     9.91 cm/s TAPSE (M-mode): 1.6 cm  LEFT ATRIUM              Index        RIGHT ATRIUM           Index LA diam:  5.50 cm  2.55 cm/m   RA Area:     19.60 cm LA Vol (A2C):   110.0 ml 51.10 ml/m  RA Volume:   62.70 ml  29.13 ml/m LA Vol (A4C):   62.9 ml  29.22 ml/m LA Biplane Vol: 89.8 ml  41.71 ml/m AORTIC VALVE LVOT Vmax:   82.10 cm/s LVOT Vmean:  54.167 cm/s LVOT VTI:    0.167 m  AORTA Ao Root diam: 3.60 cm Ao Asc diam:  4.00 cm  MV E velocity: 110.00 cm/s SHUNTS Systemic VTI:  0.17 m Systemic Diam: 2.20 cm  Dietrich Pates MD Electronically signed by Dietrich Pates MD Signature Date/Time: 03/08/2022/10:32:09 PM    Final   TEE  ECHO TEE 04/18/2015  Narrative *[place]* *[facility]* [address] [place], [place] [address] [phone]  ------------------------------------------------------------------- Intraoperative Transesophageal Echocardiography  Patient:    Sipriano, Cancel MR #:       [id number] Study Date: 04/18/2015 Gender:     M Age:        49 Height: Weight: BSA: Pt. Status: Room:  ATTENDING    Sheliah Plane MD [facility]     Sheliah Plane MD REFERRING    Sheliah Plane MD SONOGRAPHER  Arvil Chaco PERFORMING   Lewie Loron, MD  cc:  ------------------------------------------------------------------- LV EF: 50% -    55%  ------------------------------------------------------------------- Indications:     CABG by-pass RWMA  ------------------------------------------------------------------- Study Conclusions  - Left ventricle: Systolic function was normal. The estimated ejection fraction was in the range of 50% to 55%. - Aortic valve: No evidence of vegetation. - Mitral valve: No evidence of vegetation. - Left atrium: No evidence of thrombus in the atrial cavity or appendage. No evidence of thrombus in the appendage. - Atrial septum: No defect or patent foramen ovale was identified. - Pulmonic valve: No evidence of vegetation.  Intraoperative transesophageal echocardiography.  Birthdate: Patient birthdate: 1965-05-30.  Age:  Patient is 57 yr old.  Sex: Gender: male.  Study date:  Study date: 04/18/2015. Study time: 07:39 AM.  -------------------------------------------------------------------  ------------------------------------------------------------------- Left ventricle:  Systolic function was normal. The estimated ejection fraction was in the range of 50% to 55%.  ------------------------------------------------------------------- Aortic valve:   Structurally normal valve. Trileaflet. Cusp separation was normal.  No evidence of vegetation.  Doppler:  There was no regurgitation.  ------------------------------------------------------------------- Aorta:  There was no evidence for dissection.  ------------------------------------------------------------------- Mitral valve:   Structurally normal valve.   Leaflet separation was normal.  No evidence of vegetation.  Doppler:  There was trivial regurgitation.  ------------------------------------------------------------------- Left atrium:   No evidence of thrombus in the atrial cavity or appendage.  No evidence of thrombus in the appendage. The appendage was of normal size. Emptying velocity was  normal.  ------------------------------------------------------------------- Atrial septum:  No defect or patent foramen ovale was identified.  ------------------------------------------------------------------- Pulmonic valve:    Structurally normal valve.   Cusp separation was normal.  No evidence of vegetation.  Doppler:  There was trivial regurgitation.  ------------------------------------------------------------------- Tricuspid valve:   Doppler:  There was trivial regurgitation.  ------------------------------------------------------------------- Pre bypass:  Post bypass: LV global systolic function was unchanged from the prior stage. Tricuspid valve:  Mild regurgitation. No other change in valve function. Pre bypass: Post bypass:  ------------------------------------------------------------------- Prepared and Electronically Authenticated by  Lewie Loron, MD 2017-01-27T16:06:36             EKG:  No new tracing today  Recent Labs: 08/03/2022: ALT 26; BUN 21; Creatinine, Ser 0.94; Hemoglobin 15.1; Platelets  172; Potassium 4.6; Sodium 137  Recent Lipid Panel    Component Value Date/Time   CHOL 121 08/03/2022 1040   TRIG 67 08/03/2022 1040   HDL 44 08/03/2022 1040   CHOLHDL 2.8 08/03/2022 1040   CHOLHDL 3.8 05/20/2015 0514   VLDL 22 05/20/2015 0514   LDLCALC 63 08/03/2022 1040         Physical Exam:    VS:  BP 108/74   Pulse 74   Ht 6' (1.829 m)   Wt 224 lb (101.6 kg)   SpO2 97%   BMI 30.38 kg/m     Wt Readings from Last 3 Encounters:  08/18/22 224 lb (101.6 kg)  08/03/22 219 lb 12.8 oz (99.7 kg)  04/08/22 211 lb 6.4 oz (95.9 kg)     GEN:  Well nourished, well developed in no acute distress HEENT: Normal NECK: No JVD; No carotid bruits CARDIAC: Regular, no murmurs, rubs or gallops RESPIRATORY:  Clear to auscultation without rales, wheezing or rhonchi  ABDOMEN: Soft, non-tender, non-distended MUSCULOSKELETAL:  No edema; No deformity  SKIN:  Warm and dry NEUROLOGIC:  Alert and oriented x 3 PSYCHIATRIC:  Normal affect   ASSESSMENT:    1. Coronary artery disease involving native coronary artery of native heart without angina pectoris   2. Tobacco abuse   3. Persistent atrial fibrillation (HCC)   4. Hypercoagulable state due to persistent atrial fibrillation (HCC)   5. Bilateral carotid artery disease, unspecified type (HCC)   6. Essential (primary) hypertension   7. S/P CABG x 4   8. Palpitations      PLAN:    In order of problems listed above:  #Palpitations: #Paroxysmal Afib: CHADs-vasc 2 on apixaban. Underwent DCCV in 03/2022 with return to NSR. Now with isolated palpitations only with stress/anxiety that resolve with calming down. No episodes outside of these events and states that he has no associated chest pain, lightheadedness, dizziness or syncope. Discussed option of zio monitor but he would like to defer for now as he is trying to change things at work to avoid the anxiety provoking situation. Can consider in the future if symptoms persist  -Continue apixaban 5mg  BID -Continue metop 50mg  XL daily -Can consider zio if palpitations persist/worsen   #Multivessel CAD s/p CABG x4 in 2016: Patient with NSTEMI in 03/2014 treated with CABG x 4 as detailed above. He had a second NSTEMI 04/2015 with cath revealing a failed SVG to RI. His other 3 grafts were patent.  LVEF low normal in 2017 with mild regional wall motion abnormalities now with LVEF 60% on TTE 06/2019 with inferobasal hypokinesis. Recent myoview 06/2020 without evidence of ischemia or infarction. Currently doing well with no anginal symptoms. -Myoview 06/2020 negative for ischemia -Not on ASA due to need for [facility] -Continue lipitor 40mg  daily and zetia 10mg  daily -Continue lisinopril 40mg  daily -Continue bystolic 10mg  daily -Continue imdur 90mg  daily  #HTN: Now with soft blood pressures in the setting of significant weight loss. Will stop amlodipine. -Stop  amlodipine -Continue lisinopril 40mg  daily; if BP remains low, will decrease it to 20mg  daily (discussed with patient today) -Continue bystolic 10mg  daily -Continue imdur 90mg  daily   #Mixed Hyperlipidemia: Well controlled in 08/03/2022. -Continue lipitor 40mg  daily and zetia 10mg  daily   #Tobacco Abuse:  Continues to smoke about 1/2 pack per day, is still pre-contemplative for quitting. Low dose CT Scan due.  -Low dose CT Scan -Encourage complete cessation -Encouraged nicotine patches -Needs yearly CT screening' last 06/2021 without nodules  #  Mild Carotid Artery Disease: 1-39% bilaterally in 2022. -Repeat carotid ultrasiund in 2025 -Continue ASA, zetia and lipitor as above   #Obesity: BMI down to 30 Tolerating wegovy. -Continue wegovy -Continue healthy diet and weight loss efforts as detailed below   Exercise recommendations: Goal of exercising for at least 30 minutes a day, at least 5 times per week.  Please exercise to a moderate exertion.  This means that while exercising it is difficult to speak in full sentences, however you are not so short of breath that you feel you must stop, and not so comfortable that you can carry on a full conversation.  Exertion level should be approximately a 5/10, if 10 is the most exertion you can perform.   Diet recommendations: Recommend a heart healthy diet such as the Mediterranean diet.  This diet consists of plant based foods, healthy fats, lean meats, olive oil.  It suggests limiting the intake of simple carbohydrates such as white breads, pastries, and pastas.  It also limits the amount of red meat, wine, and dairy products such as cheese that one should consume on a daily basis.     Medication Adjustments/Labs and Tests Ordered: Current medicines are reviewed at length with the patient today.  Concerns regarding medicines are outlined above.  Orders Placed This Encounter  Procedures   CT CHEST LUNG CA SCREEN LOW DOSE W/O CM   No  orders of the defined types were placed in this encounter.    Patient Instructions  Medication Instructions:   STOP TAKING AMLODIPINE NOW  *If you need a refill on your cardiac medications before your next appointment, please call your pharmacy*    Testing/Procedures:   CT CHEST LUNG CA SCREEN LOW DOSE W/O CM NEEDS TO BE SCHEDULED NOW--ORIGINALLY SCHEDULED FOR 07/19/22 AND IMAGING CENTER CANCELLED THE APPOINTMENT AND NEVER RESCHEDULED THE PATIENT.  SCHEDULING PLEASE ARRANGE     Follow-Up: At [facility], you and your health needs are our priority.  As part of our continuing mission to provide you with exceptional heart care, we have created designated Provider Care Teams.  These Care Teams include your primary Cardiologist (physician) and Advanced Practice Providers (APPs -  Physician Assistants and Nurse Practitioners) who all work together to provide you with the care you need, when you need it.  We recommend signing up for the patient portal called "MyChart".  Sign up information is provided on this After Visit Summary.  MyChart is used to connect with patients for Virtual Visits (Telemedicine).  Patients are able to view lab/test results, encounter notes, upcoming appointments, etc.  Non-urgent messages can be sent to your provider as well.   To learn more about what you can do with MyChart, go to [email or web address].    Your next appointment:   6 month(s)  Provider:   DR. MARK SKAINS    Signed, Meriam Sprague, MD  08/18/2022 9:11 AM    [facility]

## 2022-08-20 ENCOUNTER — Other Ambulatory Visit: Payer: Self-pay

## 2022-08-20 MED ORDER — ISOSORBIDE MONONITRATE ER 60 MG PO TB24: 90.0000 mg | ORAL_TABLET | Freq: Every day | ORAL | 3 refills | Status: DC

## 2022-08-21 IMAGING — CT CT CHEST LUNG CANCER SCREENING LOW DOSE W/O CM
1 series · 10 of 10 positions shown, 13 images · non-contrast
Comparison: 07/16/2020

CLINICAL DATA: Fifty-one pack-year smoking history. Current smoker.



[ct lung segmentation data · axial · 0.79mm/px · z∈[-85,-85]mm · 10 of 327 frames shown]
[frame 1/327  mediastinal]
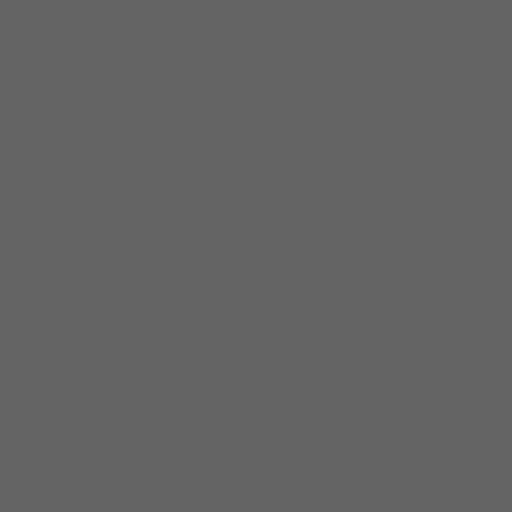
[frame 1/327  lung]
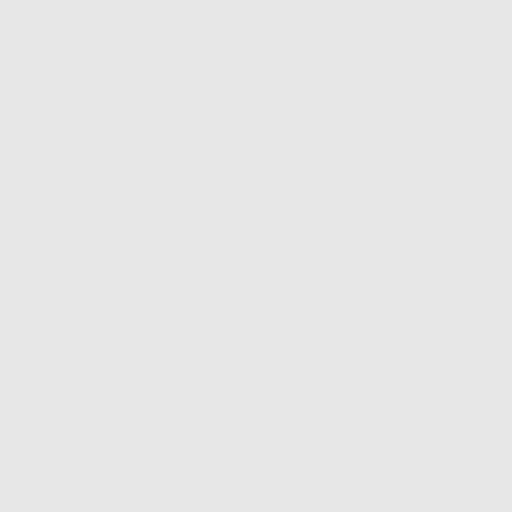
[frame 37/327  lung]
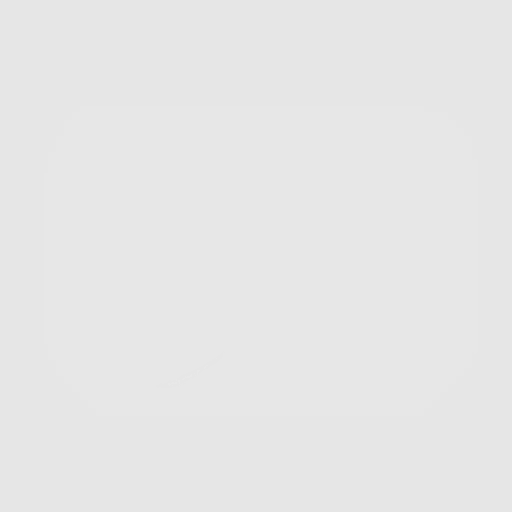
[frame 73/327  lung]
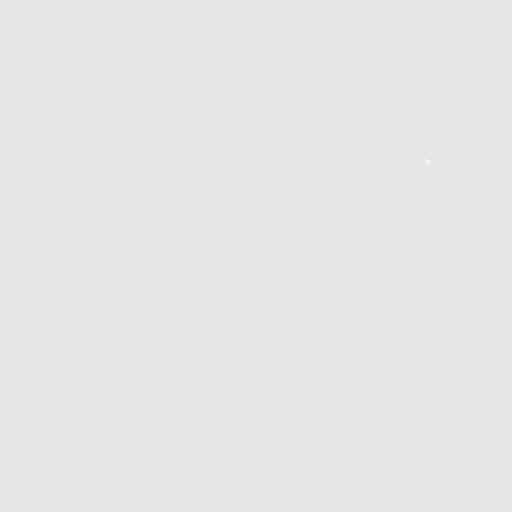
[frame 109/327  lung]
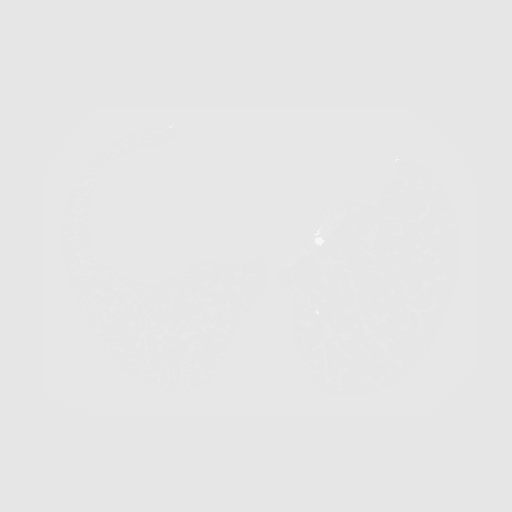
[frame 145/327  mediastinal]
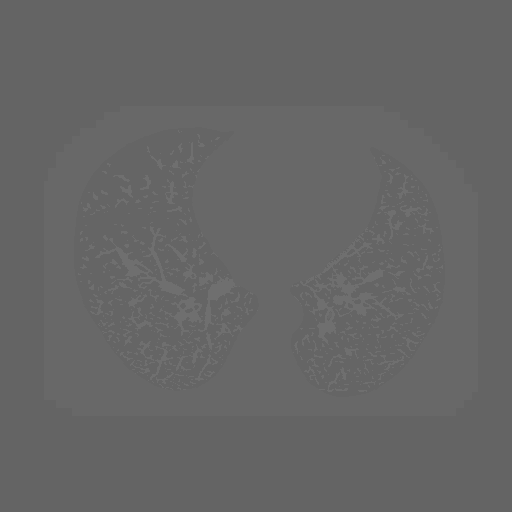
[frame 145/327  lung]
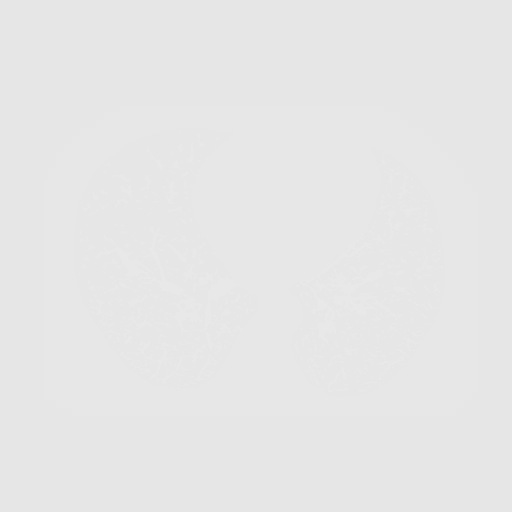
[frame 182/327  lung]
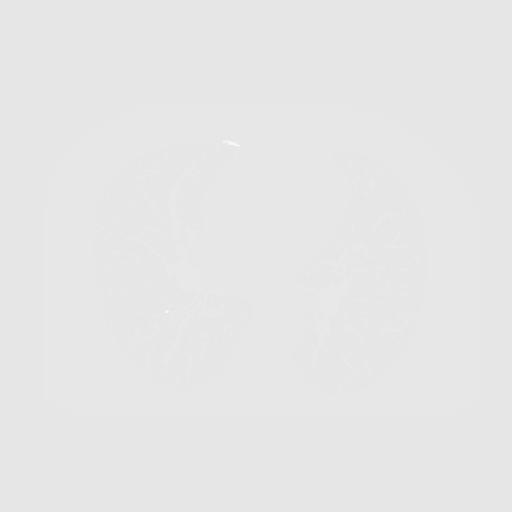
[frame 218/327  lung]
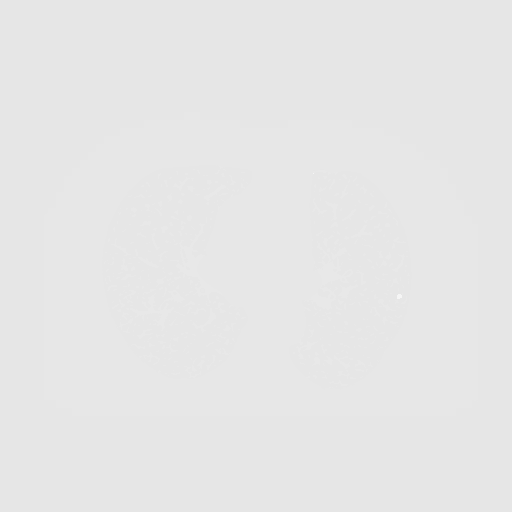
[frame 254/327  lung]
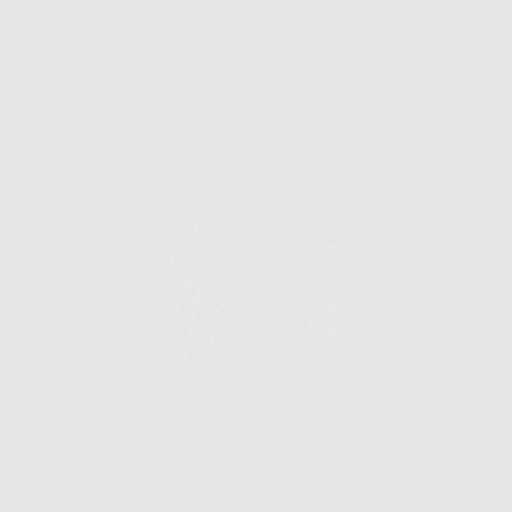
[frame 290/327  mediastinal]
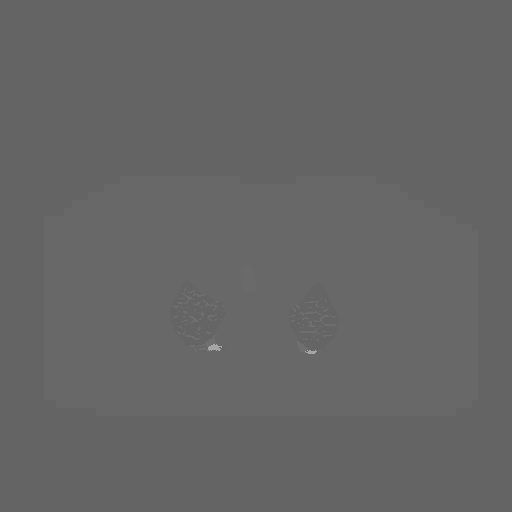
[frame 290/327  lung]
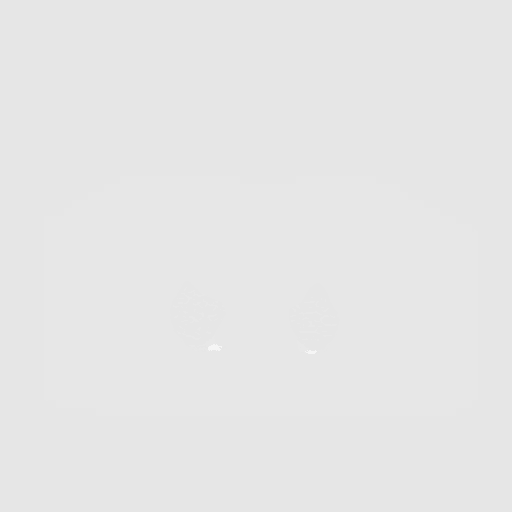
[frame 327/327  lung]
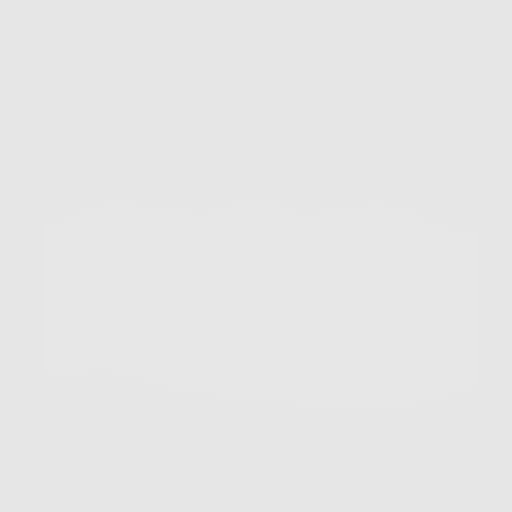

[10 of 10 positions shown; findings below may reference images not displayed]

FINDINGS: Cardiovascular: Aortic atherosclerosis. Tortuous thoracic aorta.
Mild cardiomegaly, without pericardial effusion. Prior median
sternotomy for CABG.

Mediastinum/Nodes: No mediastinal or definite hilar adenopathy,
given limitations of unenhanced CT.

Lungs/Pleura: No pleural fluid. Mild centrilobular emphysema.
Pulmonary nodules of maximally volume derived equivalent diameter
4.4 mm, similar.

Upper Abdomen: Subcentimeter low-density hepatic dome lesion is
likely a cyst. Normal imaged portions of the spleen, stomach,
pancreas, gallbladder, adrenal glands, kidneys.

Musculoskeletal: Prior median sternotomy.
IMPRESSION: Lung-RADS 2, benign appearance or behavior. Continue annual
screening with low-dose chest CT without contrast in 12 months.

Aortic Atherosclerosis (37OU5-T2W.W) and Emphysema (37OU5-70Y.5).

## 2022-08-23 ENCOUNTER — Telehealth: Payer: Self-pay | Admitting: *Deleted

## 2022-08-23 NOTE — Telephone Encounter (Signed)
   Pre-operative Risk Assessment    Patient Name: [name]  DOB: [birth year] MRN: [id number]      Request for Surgical Clearance    Procedure:   CATARACT EXTRACTION BY PE,IOL-LEFT EYE  Date of Surgery:  Clearance 09/03/22                                 Surgeon:  DR. Georges Mouse Surgeon's Group or Practice Name:  [facility]  Phone number:  [phone] Fax number:  [phone]   Type of Clearance Requested:   - Medical ; PER CLEARANCE FORM PT DOES NOT NEED TO HOLD ANY ANTICOAGULANTS PRIOR TO PROCEDURE   Type of Anesthesia:   IV SEDATION   Additional requests/questions:    [name]   08/23/2022, 1:48 PM

## 2022-08-24 NOTE — Telephone Encounter (Signed)
   Patient Name: Ke Bungay  DOB: [birth year] MRN: [id number]  Primary Cardiologist: Meriam Sprague, MD  Chart reviewed as part of pre-operative protocol coverage. Cataract extractions are recognized in guidelines as low risk surgeries that do not typically require specific preoperative testing or holding of blood thinner therapy. Therefore, given past medical history and time since last visit, based on ACC/AHA guidelines, Vy Cisse would be at acceptable risk for the planned procedure without further cardiovascular testing.   I will route this recommendation to the requesting party via Epic fax function and remove from pre-op pool.  Please call with questions.  Carlos Levering, NP 08/24/2022, 10:03 AM

## 2022-08-30 ENCOUNTER — Other Ambulatory Visit: Payer: Self-pay | Admitting: *Deleted

## 2022-08-30 DIAGNOSIS — Z720 Tobacco use: Secondary | ICD-10-CM

## 2022-08-30 DIAGNOSIS — I2510 Atherosclerotic heart disease of native coronary artery without angina pectoris: Secondary | ICD-10-CM

## 2022-08-30 DIAGNOSIS — I779 Disorder of arteries and arterioles, unspecified: Secondary | ICD-10-CM

## 2022-08-30 DIAGNOSIS — Z79899 Other long term (current) drug therapy: Secondary | ICD-10-CM

## 2022-08-30 DIAGNOSIS — E785 Hyperlipidemia, unspecified: Secondary | ICD-10-CM

## 2022-08-30 DIAGNOSIS — E782 Mixed hyperlipidemia: Secondary | ICD-10-CM

## 2022-08-30 DIAGNOSIS — I10 Essential (primary) hypertension: Secondary | ICD-10-CM

## 2022-08-30 DIAGNOSIS — I4891 Unspecified atrial fibrillation: Secondary | ICD-10-CM

## 2022-08-30 DIAGNOSIS — Z951 Presence of aortocoronary bypass graft: Secondary | ICD-10-CM

## 2022-08-30 MED ORDER — APIXABAN 5 MG PO TABS: 5.0000 mg | ORAL_TABLET | Freq: Two times a day (BID) | ORAL | 5 refills | Status: DC

## 2022-08-30 NOTE — Telephone Encounter (Signed)
Eliquis 5mg  refill request received. Patient is 56 years old, weight-101.6kg, Crea-0.94 on 08/03/22, Diagnosis-Afib, and last seen by Laurance Flatten on 08/18/22. Dose is appropriate based on dosing criteria. Will send in refill to requested pharmacy.

## 2022-09-02 ENCOUNTER — Other Ambulatory Visit: Payer: Self-pay | Admitting: Family Medicine

## 2022-09-02 DIAGNOSIS — F419 Anxiety disorder, unspecified: Secondary | ICD-10-CM

## 2022-09-03 DIAGNOSIS — H2512 Age-related nuclear cataract, left eye: Secondary | ICD-10-CM | POA: Diagnosis not present

## 2022-09-03 DIAGNOSIS — I2510 Atherosclerotic heart disease of native coronary artery without angina pectoris: Secondary | ICD-10-CM | POA: Diagnosis not present

## 2022-09-06 ENCOUNTER — Encounter: Payer: Self-pay | Admitting: Cardiology

## 2022-09-07 ENCOUNTER — Encounter: Payer: Self-pay | Admitting: Cardiology

## 2022-09-07 NOTE — Telephone Encounter (Signed)
Dr. Shari Prows reviewed the pts rhythm strip he brought into her to review.  Dr. Shari Prows did review the post-op rhythm strip and pt was indeed in afib during that time.   Per Dr. Shari Prows, she would like for afib clinic to get the pt in for further evaluation.   Called the pt and informed him of this.  Pt is aware that afib clinic will reach out to him soon to get this appt.   Pt verbalized understanding and agrees with this plan.

## 2022-09-10 ENCOUNTER — Ambulatory Visit (HOSPITAL_COMMUNITY)
Admission: RE | Admit: 2022-09-10 | Discharge: 2022-09-10 | Disposition: A | Discharge status: Home / Self Care | Payer: BLUE CROSS/BLUE SHIELD | Source: Ambulatory Visit | Attending: Physician Assistant | Admitting: Physician Assistant

## 2022-09-10 ENCOUNTER — Encounter (HOSPITAL_COMMUNITY): Payer: Self-pay | Admitting: Physician Assistant

## 2022-09-10 VITALS — BP 126/72 | HR 67 | Ht 72.0 in | Wt 222.2 lb

## 2022-09-10 DIAGNOSIS — I2510 Atherosclerotic heart disease of native coronary artery without angina pectoris: Secondary | ICD-10-CM | POA: Insufficient documentation

## 2022-09-10 DIAGNOSIS — D6869 Other thrombophilia: Secondary | ICD-10-CM | POA: Diagnosis not present

## 2022-09-10 DIAGNOSIS — Z951 Presence of aortocoronary bypass graft: Secondary | ICD-10-CM | POA: Diagnosis not present

## 2022-09-10 DIAGNOSIS — I10 Essential (primary) hypertension: Secondary | ICD-10-CM | POA: Insufficient documentation

## 2022-09-10 DIAGNOSIS — Z79899 Other long term (current) drug therapy: Secondary | ICD-10-CM | POA: Diagnosis not present

## 2022-09-10 DIAGNOSIS — Z7902 Long term (current) use of antithrombotics/antiplatelets: Secondary | ICD-10-CM | POA: Insufficient documentation

## 2022-09-10 DIAGNOSIS — Z7901 Long term (current) use of anticoagulants: Secondary | ICD-10-CM | POA: Diagnosis not present

## 2022-09-10 DIAGNOSIS — F17200 Nicotine dependence, unspecified, uncomplicated: Secondary | ICD-10-CM | POA: Insufficient documentation

## 2022-09-10 DIAGNOSIS — E785 Hyperlipidemia, unspecified: Secondary | ICD-10-CM | POA: Diagnosis not present

## 2022-09-10 DIAGNOSIS — I4819 Other persistent atrial fibrillation: Secondary | ICD-10-CM | POA: Diagnosis not present

## 2022-09-10 LAB — BASIC METABOLIC PANEL
Anion gap: 9 (ref 5–15)
BUN: 20 mg/dL (ref 6–20)
CO2: 21 mmol/L — ABNORMAL LOW (ref 22–32)
Calcium: 9 mg/dL (ref 8.9–10.3)
Chloride: 104 mmol/L (ref 98–111)
Creatinine, Ser: 1.12 mg/dL (ref 0.61–1.24)
GFR, Estimated: >60 mL/min (ref >60)
Glucose, Bld: 101 mg/dL — ABNORMAL HIGH (ref 70–99)
Potassium: 4 mmol/L (ref 3.5–5.1)
Sodium: 134 mmol/L — ABNORMAL LOW (ref 135–145)

## 2022-09-10 LAB — CBC
HCT: 46 % (ref 39.0–52.0)
Hemoglobin: 16.1 g/dL (ref 13.0–17.0)
MCH: 31.9 pg (ref 26.0–34.0)
MCHC: 35 g/dL (ref 30.0–36.0)
MCV: 91.1 fL (ref 80.0–100.0)
Platelets: 163 10*3/uL (ref 150–400)
RBC: 5.05 MIL/uL (ref 4.22–5.81)
RDW: 13 % (ref 11.5–15.5)
WBC: 7.3 10*3/uL (ref 4.0–10.5)
nRBC: 0 % (ref 0.0–0.2)

## 2022-09-10 NOTE — Progress Notes (Signed)
Primary Care Physician: Georganna Skeans, MD Primary Cardiologist: Dr Shari Prows Primary Electrophysiologist: none Referring Physician: Dr Jen Mow [name] is a 57 y.o. male with a history of CAD s/p CABG 2017, tobacco abuse, HTN, HLD, atrial fibrillation who presents for follow up in the [facility].  The patient was initially diagnosed with atrial fibrillation in 2017 postoperatively following his CABG. He was seen by Dr Shari Prows 02/17/22 and was found to be back in afib at that visit. He was started on Eliquis for a CHADS2VASC score of 2. His amlodipine was also decreased due to low BP and dizziness. He denies significant alcohol use, snores intermittently. Patient is s/p DCCV on 03/23/22.   On follow up today, patient underwent L cataract surgery on 09/03/22 and was found to be back in rate controlled afib. He brought the rhythm strip to Dr Devin Going office who reviewed the strip and confirmed he was back in afib, referred back to AF clinic to discuss rhythm options. Patient is unaware of his arrhythmia. No bleeding issues on anticoagulation.   Today, he denies symptoms of palpitations, chest pain, shortness of breath, orthopnea, PND, lower extremity edema, dizziness, presyncope, syncope, daytime somnolence, bleeding, or neurologic sequela. The patient is tolerating medications without difficulties and is otherwise without complaint today.    Atrial Fibrillation Risk Factors:  he does have symptoms or diagnosis of sleep apnea. he does not have a history of rheumatic fever. he does not have a history of alcohol use. The patient does not have a history of early familial atrial fibrillation or other arrhythmias.   Atrial Fibrillation Management history:  Previous antiarrhythmic drugs: amiodarone (post op) Previous cardioversions: 03/23/22 Previous ablations: none Anticoagulation history: Eliquis   Past Medical History:  Diagnosis Date   Anxiety     Depression    Hyperlipidemia    Hypertension    Lumbar herniated disc dx'd 03/2015   NSTEMI (non-ST elevated myocardial infarction) (HCC) 04/15/2015    ROS- All systems are reviewed and negative except as per the HPI above.  Physical Exam: Vitals:   09/10/22 0820  BP: 126/72  Pulse: 67  Weight: 100.8 kg  Height: 6' (1.829 m)    GEN: Well nourished, well developed in no acute distress NECK: No JVD; No carotid bruits CARDIAC: Irregularly irregular rate and rhythm, no murmurs, rubs, gallops RESPIRATORY:  Clear to auscultation without rales, wheezing or rhonchi  ABDOMEN: Soft, non-tender, non-distended EXTREMITIES:  No edema; No deformity    Wt Readings from Last 3 Encounters:  09/10/22 100.8 kg  08/18/22 101.6 kg  08/03/22 99.7 kg    EKG today demonstrates  Afib Vent. rate 67 BPM PR interval * ms QRS duration 92 ms QT/QTcB 396/418 ms   Echo 03/08/22 demonstrated   1. Left ventricular ejection fraction, by estimation, is 60 to 65%. The  left ventricle has normal function. The left ventricle has no regional  wall motion abnormalities. Left ventricular diastolic parameters are  indeterminate.   2. Right ventricular systolic function is normal. The right ventricular  size is normal.   3. Left atrial size was mildly dilated.   4. Right atrial size was mildly dilated.   5. The mitral valve is normal in structure. Mild mitral valve  regurgitation.   6. The aortic valve is tricuspid. Aortic valve regurgitation is not  visualized. Aortic valve sclerosis/calcification is present, without any  evidence of aortic stenosis.   7. The inferior vena cava is dilated in size  with <50% respiratory  variability, suggesting right atrial pressure of 15 mmHg.   Epic records are reviewed at length today  CHA2DS2-VASc Score = 2  The patient's score is based upon: CHF History: 0 HTN History: 1 Diabetes History: 0 Stroke History: 0 Vascular Disease History: 1 Age Score:  0 Gender Score: 0       ASSESSMENT AND PLAN: Persistent Atrial Fibrillation (ICD10:  I48.19) The patient's CHA2DS2-VASc score is 2, indicating a 2.2% annual risk of stroke.   Patient back in rate controlled afib. We discussed rhythm control options today including AAD and ablation. He is agreeable to referral to EP to discuss ablation. In the meantime, will arrange for DCCV. He has not missed any doses of anticoagulation in the past 3 weeks. Instructions for holding [facility] given.  Continue Eliquis 5 mg BID Continue Toprol 50 mg daily  Secondary Hypercoagulable State (ICD10:  D68.69) The patient is at significant risk for stroke/thromboembolism based upon his CHA2DS2-VASc Score of 2.  Continue Apixaban (Eliquis).   CAD S/p CABG 2017 No anginal symptoms On statin and Plavix  HTN Stable on current regimen   Follow up with EP post ablation to establish care and discuss possible ablation.    Jorja Loa PA-C Afib Clinic [facility] [address] [place], [place] [address] [phone] 09/10/2022 8:31 AM

## 2022-09-10 NOTE — H&P (View-Only) (Signed)
  Primary Care Physician: Wilson, Amelia, MD Primary Cardiologist: Dr Pemberton Primary Electrophysiologist: none Referring Physician: Dr Pemberton    [name] is a 57 y.o. male with a history of CAD s/p CABG 2017, tobacco abuse, HTN, HLD, atrial fibrillation who presents for follow up in the [place] [facility].  The patient was initially diagnosed with atrial fibrillation in 2017 postoperatively following his CABG. He was seen by Dr Pemberton 02/17/22 and was found to be back in afib at that visit. He was started on Eliquis for a CHADS2VASC score of 2. His amlodipine was also decreased due to low BP and dizziness. He denies significant alcohol use, snores intermittently. Patient is s/p DCCV on 03/23/22.   On follow up today, patient underwent L cataract surgery on 09/03/22 and was found to be back in rate controlled afib. He brought the rhythm strip to Dr Pemberton's office who reviewed the strip and confirmed he was back in afib, referred back to AF clinic to discuss rhythm options. Patient is unaware of his arrhythmia. No bleeding issues on anticoagulation.   Today, he denies symptoms of palpitations, chest pain, shortness of breath, orthopnea, PND, lower extremity edema, dizziness, presyncope, syncope, daytime somnolence, bleeding, or neurologic sequela. The patient is tolerating medications without difficulties and is otherwise without complaint today.    Atrial Fibrillation Risk Factors:  he does have symptoms or diagnosis of sleep apnea. he does not have a history of rheumatic fever. he does not have a history of alcohol use. The patient does not have a history of early familial atrial fibrillation or other arrhythmias.   Atrial Fibrillation Management history:  Previous antiarrhythmic drugs: amiodarone (post op) Previous cardioversions: 03/23/22 Previous ablations: none Anticoagulation history: Eliquis   Past Medical History:  Diagnosis Date   Anxiety     Depression    Hyperlipidemia    Hypertension    Lumbar herniated disc dx'd 03/2015   NSTEMI (non-ST elevated myocardial infarction) (HCC) 04/15/2015    ROS- All systems are reviewed and negative except as per the HPI above.  Physical Exam: Vitals:   09/10/22 0820  BP: 126/72  Pulse: 67  Weight: 100.8 kg  Height: 6' (1.829 m)    GEN: Well nourished, well developed in no acute distress NECK: No JVD; No carotid bruits CARDIAC: Irregularly irregular rate and rhythm, no murmurs, rubs, gallops RESPIRATORY:  Clear to auscultation without rales, wheezing or rhonchi  ABDOMEN: Soft, non-tender, non-distended EXTREMITIES:  No edema; No deformity    Wt Readings from Last 3 Encounters:  09/10/22 100.8 kg  08/18/22 101.6 kg  08/03/22 99.7 kg    EKG today demonstrates  Afib Vent. rate 67 BPM PR interval * ms QRS duration 92 ms QT/QTcB 396/418 ms   Echo 03/08/22 demonstrated   1. Left ventricular ejection fraction, by estimation, is 60 to 65%. The  left ventricle has normal function. The left ventricle has no regional  wall motion abnormalities. Left ventricular diastolic parameters are  indeterminate.   2. Right ventricular systolic function is normal. The right ventricular  size is normal.   3. Left atrial size was mildly dilated.   4. Right atrial size was mildly dilated.   5. The mitral valve is normal in structure. Mild mitral valve  regurgitation.   6. The aortic valve is tricuspid. Aortic valve regurgitation is not  visualized. Aortic valve sclerosis/calcification is present, without any  evidence of aortic stenosis.   7. The inferior vena cava is dilated in size   with <50% respiratory  variability, suggesting right atrial pressure of 15 mmHg.   Epic records are reviewed at length today  CHA2DS2-VASc Score = 2  The patient's score is based upon: CHF History: 0 HTN History: 1 Diabetes History: 0 Stroke History: 0 Vascular Disease History: 1 Age Score:  0 Gender Score: 0       ASSESSMENT AND PLAN: Persistent Atrial Fibrillation (ICD10:  I48.19) The patient's CHA2DS2-VASc score is 2, indicating a 2.2% annual risk of stroke.   Patient back in rate controlled afib. We discussed rhythm control options today including AAD and ablation. He is agreeable to referral to EP to discuss ablation. In the meantime, will arrange for DCCV. He has not missed any doses of anticoagulation in the past 3 weeks. Instructions for holding Wegovy given.  Continue Eliquis 5 mg BID Continue Toprol 50 mg daily  Secondary Hypercoagulable State (ICD10:  D68.69) The patient is at significant risk for stroke/thromboembolism based upon his CHA2DS2-VASc Score of 2.  Continue Apixaban (Eliquis).   CAD S/p CABG 2017 No anginal symptoms On statin and Plavix  HTN Stable on current regimen   Follow up with EP post ablation to establish care and discuss possible ablation.    Ricky Tanayia Wahlquist PA-C Afib Clinic [facility] [address] [place], [place] [address] [phone] 09/10/2022 8:31 AM 

## 2022-09-10 NOTE — Patient Instructions (Signed)
Cardioversion scheduled for: Tuesday, July 2nd   - Arrive at the [facility] and go to admitting at 8am   - Do not eat or drink anything after midnight the night prior to your procedure.   - Take all your morning medication (except diabetic medications) with a sip of water prior to arrival.  - You will not be able to drive home after your procedure.    - Do NOT miss any doses of your blood thinner - if you should miss a dose please notify our office immediately.   - If you feel as if you go back into normal rhythm prior to scheduled cardioversion, please notify our office immediately.   If your procedure is canceled in the cardioversion suite you will be charged a cancellation fee.    Hold below medications 7 days prior to scheduled procedure/anesthesia.  Restart medication on the normal dosing day after scheduled procedure/anesthesia  Dulaglutide (Trulicity) Exenatide extended release (Bydureon bcise) Semaglutide (Ozempic) (WEGOVY)  Tirzepatide Greggory Keen)

## 2022-09-12 NOTE — Pre-Procedure Instructions (Signed)
Patient scheduled for procedure on Tuesday 7/2.  Scheduled with anesthesia.  Anesthesia require certain medication to be held prior to procedure.  Left voicemail that last dose of Wegovy should be 6/24 until after procedure.

## 2022-09-17 DIAGNOSIS — H2511 Age-related nuclear cataract, right eye: Secondary | ICD-10-CM | POA: Diagnosis not present

## 2022-09-17 DIAGNOSIS — I2510 Atherosclerotic heart disease of native coronary artery without angina pectoris: Secondary | ICD-10-CM | POA: Diagnosis not present

## 2022-09-20 NOTE — Progress Notes (Signed)
Left a voicemail for pt and instructed them to come at 0645 and to be NPO after 0000. Instructed to take eliquis in am with a small sip of water.  Stated that pt will need to have a ride home and someone to stay with them for 24 hours after the procedure.

## 2022-09-20 NOTE — Anesthesia Preprocedure Evaluation (Signed)
Anesthesia Evaluation  Patient identified by MRN, date of birth, ID band Patient awake    Reviewed: Allergy & Precautions, NPO status , Patient's Chart, lab work & pertinent test results  Airway Mallampati: II  TM Distance: >3 FB Neck ROM: Full    Dental  (+) Upper Dentures   Pulmonary Current Smoker   Pulmonary exam normal        Cardiovascular hypertension, Pt. on medications and Pt. on home beta blockers + CAD and + Past MI  + dysrhythmias Atrial Fibrillation  Rhythm:Regular Rate:Normal     Neuro/Psych   Anxiety Depression    negative neurological ROS     GI/Hepatic Neg liver ROS,GERD  Medicated,,  Endo/Other  negative endocrine ROS    Renal/GU      Musculoskeletal   Abdominal Normal abdominal exam  (+)   Peds  Hematology negative hematology ROS (+)   Anesthesia Other Findings   Reproductive/Obstetrics                             Anesthesia Physical Anesthesia Plan  ASA: 3  Anesthesia Plan: General   Post-op Pain Management:    Induction: Intravenous  PONV Risk Score and Plan: 1 and Propofol infusion and Treatment may vary due to age or medical condition  Airway Management Planned: Mask  Additional Equipment: None  Intra-op Plan:   Post-operative Plan:   Informed Consent: I have reviewed the patients History and Physical, chart, labs and discussed the procedure including the risks, benefits and alternatives for the proposed anesthesia with the patient or authorized representative who has indicated his/her understanding and acceptance.     Dental advisory given  Plan Discussed with: CRNA  Anesthesia Plan Comments:        Anesthesia Quick Evaluation

## 2022-09-21 ENCOUNTER — Ambulatory Visit (HOSPITAL_COMMUNITY): Payer: 59 | Admitting: Anesthesiology

## 2022-09-21 ENCOUNTER — Encounter (HOSPITAL_COMMUNITY)
Admission: RE | Disposition: A | Discharge status: Home / Self Care | Payer: Self-pay | Source: Home / Self Care | Attending: Internal Medicine

## 2022-09-21 ENCOUNTER — Encounter (HOSPITAL_COMMUNITY): Payer: Self-pay | Admitting: Internal Medicine

## 2022-09-21 ENCOUNTER — Ambulatory Visit (HOSPITAL_BASED_OUTPATIENT_CLINIC_OR_DEPARTMENT_OTHER): Payer: 59 | Admitting: Anesthesiology

## 2022-09-21 ENCOUNTER — Ambulatory Visit (HOSPITAL_COMMUNITY)
Admission: RE | Admit: 2022-09-21 | Discharge: 2022-09-21 | Disposition: A | Discharge status: Home / Self Care | Payer: 59 | Attending: Internal Medicine | Admitting: Internal Medicine

## 2022-09-21 ENCOUNTER — Other Ambulatory Visit: Payer: Self-pay

## 2022-09-21 DIAGNOSIS — F17210 Nicotine dependence, cigarettes, uncomplicated: Secondary | ICD-10-CM

## 2022-09-21 DIAGNOSIS — I2510 Atherosclerotic heart disease of native coronary artery without angina pectoris: Secondary | ICD-10-CM | POA: Insufficient documentation

## 2022-09-21 DIAGNOSIS — Z951 Presence of aortocoronary bypass graft: Secondary | ICD-10-CM | POA: Diagnosis not present

## 2022-09-21 DIAGNOSIS — I10 Essential (primary) hypertension: Secondary | ICD-10-CM | POA: Diagnosis not present

## 2022-09-21 DIAGNOSIS — Z79899 Other long term (current) drug therapy: Secondary | ICD-10-CM | POA: Insufficient documentation

## 2022-09-21 DIAGNOSIS — Z7901 Long term (current) use of anticoagulants: Secondary | ICD-10-CM | POA: Diagnosis not present

## 2022-09-21 DIAGNOSIS — I4891 Unspecified atrial fibrillation: Secondary | ICD-10-CM

## 2022-09-21 DIAGNOSIS — I4819 Other persistent atrial fibrillation: Secondary | ICD-10-CM | POA: Insufficient documentation

## 2022-09-21 DIAGNOSIS — Z7902 Long term (current) use of antithrombotics/antiplatelets: Secondary | ICD-10-CM | POA: Insufficient documentation

## 2022-09-21 DIAGNOSIS — D6869 Other thrombophilia: Secondary | ICD-10-CM | POA: Insufficient documentation

## 2022-09-21 DIAGNOSIS — E785 Hyperlipidemia, unspecified: Secondary | ICD-10-CM | POA: Insufficient documentation

## 2022-09-21 HISTORY — PX: CARDIOVERSION: SHX1299

## 2022-09-21 SURGERY — CARDIOVERSION
Anesthesia: General

## 2022-09-21 MED ORDER — LIDOCAINE HCL (CARDIAC) PF 100 MG/5ML IV SOSY
PREFILLED_SYRINGE | INTRAVENOUS | Status: DC | PRN
  Administered 2022-09-21: 80 mg via INTRAVENOUS

## 2022-09-21 MED ORDER — SODIUM CHLORIDE 0.9 % IV SOLN: INTRAVENOUS | Status: DC

## 2022-09-21 MED ORDER — PROPOFOL 10 MG/ML IV BOLUS
INTRAVENOUS | Status: DC | PRN
  Administered 2022-09-21: 90 mg via INTRAVENOUS

## 2022-09-21 SURGICAL SUPPLY — 1 items: ELECT DEFIB PAD ADLT CADENCE (PAD) ×1 IMPLANT

## 2022-09-21 NOTE — CV Procedure (Signed)
Procedure: Electrical Cardioversion Indications:  Atrial Fibrillation  Procedure Details:  Consent: Risks of procedure as well as the alternatives and risks of each were explained to the (patient/caregiver).  Consent for procedure obtained.  Time Out: Verified patient identification, verified procedure, site/side was marked, verified correct patient position, special equipment/implants available, medications/allergies/relevent history reviewed, required imaging and test results available. PERFORMED.  Patient placed on cardiac monitor, pulse oximetry, supplemental oxygen as necessary.  Sedation given:  Propofol Pacer pads placed anterior and posterior chest.  Cardioverted 1 time(s).  Cardioversion with synchronized biphasic 200J shock.  Evaluation: Findings: Post procedure EKG shows: NSR Complications: None Patient did tolerate procedure well.  Time Spent Directly with the Patient:  10 minutes   Maisie Fus 09/21/2022, 7:54 AM

## 2022-09-21 NOTE — Interval H&P Note (Signed)
History and Physical Interval Note:  09/21/2022 7:05 AM  [name]  has presented today for surgery, with the diagnosis of AFIB.  The various methods of treatment have been discussed with the patient and family. After consideration of risks, benefits and other options for treatment, the patient has consented to  Procedure(s): CARDIOVERSION ([name]) as a surgical intervention.  The patient's history has been reviewed, patient examined, no change in status, stable for surgery.  I have reviewed the patient's chart and labs.  Questions were answered to the patient's satisfaction.     Maisie Fus

## 2022-09-21 NOTE — Transfer of Care (Signed)
Immediate Anesthesia Transfer of Care Note  Patient: [name]  Procedure(s) Performed: CARDIOVERSION  Patient Location: Cath Lab  Anesthesia Type:MAC  Level of Consciousness: awake  Airway & Oxygen Therapy: Patient Spontanous Breathing and Patient connected to nasal cannula oxygen  Post-op Assessment: Report given to RN and Post -op Vital signs reviewed and stable  Post vital signs: Reviewed and stable  Last Vitals:  Vitals Value Taken Time  BP    Temp    Pulse    Resp    SpO2      Last Pain:  Vitals:   09/21/22 0645  TempSrc: Temporal  PainSc: 0-No pain         Complications: No notable events documented.

## 2022-09-21 NOTE — Anesthesia Postprocedure Evaluation (Signed)
Anesthesia Post Note  Patient: [name]  Procedure(s) Performed: CARDIOVERSION     Patient location during evaluation: PACU Anesthesia Type: General Level of consciousness: awake and alert Pain management: pain level controlled Vital Signs Assessment: post-procedure vital signs reviewed and stable Respiratory status: spontaneous breathing, nonlabored ventilation, respiratory function stable and patient connected to nasal cannula oxygen Cardiovascular status: blood pressure returned to baseline and stable Postop Assessment: no apparent nausea or vomiting Anesthetic complications: no   No notable events documented.  Last Vitals:  Vitals:   09/21/22 0815 09/21/22 0820  BP: (!) 135/91 (!) 129/96  Pulse: 71 68  Resp: 17 14  Temp:    SpO2: 96% 94%    Last Pain:  Vitals:   09/21/22 0820  TempSrc:   PainSc: 0-No pain                 Nelle Don Alyannah Sanks

## 2022-09-22 ENCOUNTER — Encounter (HOSPITAL_COMMUNITY): Payer: Self-pay | Admitting: Internal Medicine

## 2022-10-13 ENCOUNTER — Ambulatory Visit (HOSPITAL_COMMUNITY)
Admission: RE | Admit: 2022-10-13 | Discharge: 2022-10-13 | Disposition: A | Discharge status: Home / Self Care | Payer: 59 | Source: Ambulatory Visit | Attending: Cardiology | Admitting: Cardiology

## 2022-10-13 DIAGNOSIS — Z720 Tobacco use: Secondary | ICD-10-CM | POA: Diagnosis not present

## 2022-10-16 ENCOUNTER — Encounter: Payer: Self-pay | Admitting: Cardiology

## 2022-10-20 ENCOUNTER — Telehealth: Payer: Self-pay | Admitting: *Deleted

## 2022-10-20 DIAGNOSIS — Z720 Tobacco use: Secondary | ICD-10-CM

## 2022-10-20 NOTE — Telephone Encounter (Signed)
-----   Message from Meriam Sprague sent at 10/20/2022  3:11 PM EDT ----- CT scan looks stable. Will continue with annual monitoring

## 2022-10-20 NOTE — Telephone Encounter (Signed)
The patient has been notified of the result and verbalized understanding.  All questions (if any) were answered.  Pt aware that we will continue with annual monitoring and I will place this order under his new Cardiologist, Dr. Anne Fu.  Pt aware that he will be contacted a little closer to the one year mark to get this test scheduled.  Pt verbalized understanding and agrees with this plan.

## 2022-10-28 NOTE — Progress Notes (Signed)
  Electrophysiology Office Note:    Date:  10/29/2022   ID:  [name], DOB [birth year], MRN [id number]  PCP:  Georganna Skeans, MD   [facility] Cardiologist:  Meriam Sprague, MD     Referring MD: Danice Goltz, PA   History of Present Illness:    [name] is a 57 y.o. male with a medical history significant for CAD s/p CABG in 2017, referred for AF management.     He initially had atrial fibrillation after CABG in 2017. He was noted to be in AF again in follow up with Dr. Shari Prows in Nov 2023.  He underwent DCCV on 03/23/2022. He was found to be in AF again 09/03/2022 at the time of cataract surgery and in follow-up in AF clinic June 21. He was cardioverted again September 21, 2022.   He is again in AF today (10/29/2022).  He does not have palpitations and cannot sense immediately whether he is in A-fib or normal rhythm.  However, he does have significant fatigue while in atrial fibrillation.     Today, he reports that he is at baseline.  He is aware that he is in atrial fibrillation because he feels more fatigued than usual.  He feels that he went back into A-fib shortly after his most recent cardioversion.   EKGs/Labs/Other Studies Reviewed Today:    Echocardiogram:  03/08/2022 EF 60-65%, mild atrial dilation   Monitors:   Stress testing:   Advanced imaging:    Cardiac catherization    EKG:   EKG Interpretation Date/Time:  Friday October 29 2022 10:05:14 EDT Ventricular Rate:  67 PR Interval:    QRS Duration:  96 QT Interval:  402 QTC Calculation: 424 R Axis:   11  Text Interpretation: Atrial fibrillation When compared with ECG of 21-Sep-2022 07:56, Atrial fibrillation has replaced Sinus rhythm Confirmed by York Pellant [phone]) on 10/29/2022 10:24:10 AM     Physical Exam:    VS:  BP (!) 154/100 (BP Location: Left Arm, Patient Position: Sitting, Cuff Size: Large)   Pulse 67   Ht 6' (1.829 m)   Wt 226 lb 6.4 oz (102.7  kg)   SpO2 97%   BMI 30.71 kg/m     Wt Readings from Last 3 Encounters:  10/29/22 226 lb 6.4 oz (102.7 kg)  09/21/22 220 lb (99.8 kg)  09/10/22 222 lb 3.2 oz (100.8 kg)     GEN: Well nourished, well developed in no acute distress CARDIAC: iRRR, no murmurs, rubs, gallops RESPIRATORY:  Normal work of breathing MUSCULOSKELETAL: no edema    ASSESSMENT & PLAN:    Persistent atrial fibrillation Symptomatic with fatigue S/p multiple cardioversions and early recurrence of A-fib We discussed management options and jointly decided to pursue rhythm control by ablation Will start amiodarone (200 mg twice daily for 2 weeks, cardiovert, then 200 mg daily) and repeat cardioversion in an effort to maintain sinus rhythm until his procedure   Secondary hypercoagulable state Continue apixaban for CHADS2Vasc of 2  Coronary artery disease Status post CABG Continue atorvastatin 40, ezetimibe 10, metoprolol XL 50   Signed, [name], MD  10/29/2022 10:24 AM    [facility]

## 2022-10-29 ENCOUNTER — Encounter: Payer: Self-pay | Admitting: Cardiovascular Disease

## 2022-10-29 ENCOUNTER — Ambulatory Visit: Payer: 59 | Attending: Cardiovascular Disease | Admitting: Cardiovascular Disease

## 2022-10-29 VITALS — BP 154/100 | HR 67 | Ht 72.0 in | Wt 226.4 lb

## 2022-10-29 DIAGNOSIS — R002 Palpitations: Secondary | ICD-10-CM | POA: Diagnosis not present

## 2022-10-29 DIAGNOSIS — I4819 Other persistent atrial fibrillation: Secondary | ICD-10-CM

## 2022-10-29 MED ORDER — AMIODARONE HCL 200 MG PO TABS: ORAL_TABLET | ORAL | 3 refills | Status: DC

## 2022-10-29 NOTE — Patient Instructions (Addendum)
Medication Instructions:  START Amiodarone 200 mg twice daily for 14 days, then decrease to 200 mg ONCE daily thereafter *If you need a refill on your cardiac medications before your next appointment, please call your pharmacy*   Testing/Procedures: Cardioversion - see information letter Your physician has recommended that you have a Cardioversion (DCCV). Electrical Cardioversion uses a jolt of electricity to your heart either through paddles or wired patches attached to your chest. This is a controlled, usually prescheduled, procedure. Defibrillation is done under light anesthesia in the hospital, and you usually go home the day of the procedure. This is done to get your heart back into a normal rhythm. You are not awake for the procedure. Please see the instruction sheet given to you today.  Atrial Fibrillation Ablation - we will contact you to set this up when Dr Zoe Lan schedule is open for next year Your physician has recommended that you have an ablation. Catheter ablation is a medical procedure used to treat some cardiac arrhythmias (irregular heartbeats). During catheter ablation, a long, thin, flexible tube is put into a blood vessel in your groin (upper thigh), or neck. This tube is called an ablation catheter. It is then guided to your heart through the blood vessel. Radio frequency waves destroy small areas of heart tissue where abnormal heartbeats may cause an arrhythmia to start. Please see the instruction sheet given to you today.   Follow-Up: At [facility], you and your health needs are our priority.  As part of our continuing mission to provide you with exceptional heart care, we have created designated Provider Care Teams.  These Care Teams include your primary Cardiologist (physician) and Advanced Practice Providers (APPs -  Physician Assistants and Nurse Practitioners) who all work together to provide you with the care you need, when you need it.  We recommend signing  up for the patient portal called "MyChart".  Sign up information is provided on this After Visit Summary.  MyChart is used to connect with patients for Virtual Visits (Telemedicine).  Patients are able to view lab/test results, encounter notes, upcoming appointments, etc.  Non-urgent messages can be sent to your provider as well.   To learn more about what you can do with MyChart, go to [email or web address].    Your next appointment:   1-2 months after cardioversion  Provider:   You will follow up in the Atrial Fibrillation Clinic located at [facility]. Your provider will be: Clint R. Fenton, PA-C

## 2022-11-04 ENCOUNTER — Ambulatory Visit: Payer: BLUE CROSS/BLUE SHIELD | Admitting: Nurse Practitioner

## 2022-11-07 ENCOUNTER — Other Ambulatory Visit: Payer: Self-pay | Admitting: Family Medicine

## 2022-11-07 DIAGNOSIS — K219 Gastro-esophageal reflux disease without esophagitis: Secondary | ICD-10-CM

## 2022-11-08 ENCOUNTER — Other Ambulatory Visit: Payer: Self-pay

## 2022-11-08 DIAGNOSIS — K219 Gastro-esophageal reflux disease without esophagitis: Secondary | ICD-10-CM

## 2022-11-08 MED ORDER — PANTOPRAZOLE SODIUM 40 MG PO TBEC
40.0000 mg | DELAYED_RELEASE_TABLET | Freq: Every day | ORAL | 0 refills | Status: DC
Start: 2022-11-08 — End: 2023-03-28

## 2022-11-08 NOTE — Telephone Encounter (Signed)
Rx refill sent in @10 :04am 11/08/2022

## 2022-11-08 NOTE — Pre-Procedure Instructions (Signed)
Patient scheduled for procedure on 8/26 with anesthesia.  Anesthesia requires certain medications to be held for 7 days before procedure.  QIONGE- last dose was 8/16 until after procedure.

## 2022-11-12 NOTE — Progress Notes (Signed)
Spoke with  patient, Procedure scheduled for   0830, Please arrive at the hospital at 0730, NPO after midnight on Sunday, May take meds with sips of water in the AM, please have transportation for home post procedure, and someone to stay with pt for approximately 24 hours after  Pt states he has been taking eliquis regularly with no missed doses

## 2022-11-14 NOTE — Anesthesia Preprocedure Evaluation (Signed)
Anesthesia Evaluation    Reviewed: Allergy & Precautions, Patient's Chart, lab work & pertinent test results, Unable to perform ROS - Chart review only  Airway        Dental   Pulmonary Current Smoker          Cardiovascular hypertension, Pt. on medications and Pt. on home beta blockers + CAD and + Past MI  + dysrhythmias Atrial Fibrillation   Echo 02/2022   Neuro/Psych  PSYCHIATRIC DISORDERS Anxiety Depression    negative neurological ROS     GI/Hepatic Neg liver ROS,GERD  Medicated,,  Endo/Other  negative endocrine ROS    Renal/GU      Musculoskeletal   Abdominal   Peds  Hematology negative hematology ROS (+)   Anesthesia Other Findings   Reproductive/Obstetrics                             Anesthesia Physical Anesthesia Plan  ASA: 3  Anesthesia Plan: General   Post-op Pain Management:    Induction: Intravenous  PONV Risk Score and Plan: 1 and Propofol infusion and Treatment may vary due to age or medical condition  Airway Management Planned: Mask  Additional Equipment: None  Intra-op Plan:   Post-operative Plan:   Informed Consent:   Plan Discussed with:   Anesthesia Plan Comments:        Anesthesia Quick Evaluation

## 2022-11-15 ENCOUNTER — Encounter (HOSPITAL_COMMUNITY)
Admission: RE | Disposition: A | Discharge status: Home / Self Care | Payer: Self-pay | Source: Home / Self Care | Attending: Cardiology

## 2022-11-15 ENCOUNTER — Other Ambulatory Visit: Payer: Self-pay

## 2022-11-15 ENCOUNTER — Ambulatory Visit (HOSPITAL_COMMUNITY)
Admission: RE | Admit: 2022-11-15 | Discharge: 2022-11-15 | Disposition: A | Discharge status: Home / Self Care | Payer: 59 | Attending: Cardiology | Admitting: Cardiology

## 2022-11-15 ENCOUNTER — Encounter (HOSPITAL_COMMUNITY): Payer: Self-pay | Admitting: Anesthesiology

## 2022-11-15 DIAGNOSIS — E876 Hypokalemia: Secondary | ICD-10-CM

## 2022-11-15 DIAGNOSIS — I4819 Other persistent atrial fibrillation: Secondary | ICD-10-CM | POA: Insufficient documentation

## 2022-11-15 DIAGNOSIS — Z8679 Personal history of other diseases of the circulatory system: Secondary | ICD-10-CM | POA: Diagnosis not present

## 2022-11-15 DIAGNOSIS — Z538 Procedure and treatment not carried out for other reasons: Secondary | ICD-10-CM | POA: Diagnosis not present

## 2022-11-15 DIAGNOSIS — Z79899 Other long term (current) drug therapy: Secondary | ICD-10-CM

## 2022-11-15 DIAGNOSIS — Z720 Tobacco use: Secondary | ICD-10-CM

## 2022-11-15 DIAGNOSIS — I2510 Atherosclerotic heart disease of native coronary artery without angina pectoris: Secondary | ICD-10-CM

## 2022-11-15 DIAGNOSIS — I161 Hypertensive emergency: Secondary | ICD-10-CM

## 2022-11-15 DIAGNOSIS — M5416 Radiculopathy, lumbar region: Secondary | ICD-10-CM

## 2022-11-15 DIAGNOSIS — R7301 Impaired fasting glucose: Secondary | ICD-10-CM

## 2022-11-15 DIAGNOSIS — F411 Generalized anxiety disorder: Secondary | ICD-10-CM

## 2022-11-15 DIAGNOSIS — Z951 Presence of aortocoronary bypass graft: Secondary | ICD-10-CM

## 2022-11-15 DIAGNOSIS — I480 Paroxysmal atrial fibrillation: Secondary | ICD-10-CM

## 2022-11-15 DIAGNOSIS — I10 Essential (primary) hypertension: Secondary | ICD-10-CM

## 2022-11-15 DIAGNOSIS — I214 Non-ST elevation (NSTEMI) myocardial infarction: Secondary | ICD-10-CM

## 2022-11-15 SURGERY — INVASIVE LAB ABORTED CASE
Anesthesia: General

## 2022-11-15 SURGICAL SUPPLY — 1 items: ELECT DEFIB PAD ADLT CADENCE (PAD) ×1 IMPLANT

## 2022-11-15 NOTE — Progress Notes (Addendum)
Patient here today for cardioversion.  Hooked to monitor and shows normal sinus rhythm.  Verified with 12-lead EKG.  Awaiting Dr. Anne Fu to see patient before procedure is cancelled.

## 2022-11-15 NOTE — Progress Notes (Signed)
In NSR. AMIO 200mg  every day  DC'd home  Donato Schultz, MD

## 2022-11-15 NOTE — H&P (Signed)
In normal sinus rhythm.  Discharged home.

## 2022-12-03 ENCOUNTER — Telehealth: Payer: Self-pay

## 2022-12-03 DIAGNOSIS — I480 Paroxysmal atrial fibrillation: Secondary | ICD-10-CM

## 2022-12-03 NOTE — Telephone Encounter (Signed)
Spoke with patient, ablation scheduled for 03/31/23 and office visit with Mealor on 03/07/23. No further needs at this time

## 2022-12-29 ENCOUNTER — Other Ambulatory Visit: Payer: Self-pay

## 2022-12-29 ENCOUNTER — Ambulatory Visit (HOSPITAL_COMMUNITY)
Admission: RE | Admit: 2022-12-29 | Discharge: 2022-12-29 | Disposition: A | Discharge status: Home / Self Care | Payer: 59 | Source: Ambulatory Visit | Attending: Internal Medicine | Admitting: Internal Medicine

## 2022-12-29 VITALS — BP 122/80 | HR 72 | Ht 72.0 in | Wt 233.6 lb

## 2022-12-29 DIAGNOSIS — Z951 Presence of aortocoronary bypass graft: Secondary | ICD-10-CM | POA: Diagnosis not present

## 2022-12-29 DIAGNOSIS — Z7902 Long term (current) use of antithrombotics/antiplatelets: Secondary | ICD-10-CM | POA: Diagnosis not present

## 2022-12-29 DIAGNOSIS — Z5181 Encounter for therapeutic drug level monitoring: Secondary | ICD-10-CM | POA: Diagnosis not present

## 2022-12-29 DIAGNOSIS — I2510 Atherosclerotic heart disease of native coronary artery without angina pectoris: Secondary | ICD-10-CM | POA: Insufficient documentation

## 2022-12-29 DIAGNOSIS — D6869 Other thrombophilia: Secondary | ICD-10-CM | POA: Diagnosis not present

## 2022-12-29 DIAGNOSIS — I4819 Other persistent atrial fibrillation: Secondary | ICD-10-CM | POA: Diagnosis not present

## 2022-12-29 DIAGNOSIS — I10 Essential (primary) hypertension: Secondary | ICD-10-CM | POA: Diagnosis not present

## 2022-12-29 DIAGNOSIS — Z7901 Long term (current) use of anticoagulants: Secondary | ICD-10-CM | POA: Insufficient documentation

## 2022-12-29 DIAGNOSIS — R9431 Abnormal electrocardiogram [ECG] [EKG]: Secondary | ICD-10-CM | POA: Diagnosis not present

## 2022-12-29 DIAGNOSIS — Z79899 Other long term (current) drug therapy: Secondary | ICD-10-CM | POA: Diagnosis not present

## 2022-12-29 DIAGNOSIS — I4891 Unspecified atrial fibrillation: Secondary | ICD-10-CM | POA: Diagnosis present

## 2022-12-29 LAB — COMPREHENSIVE METABOLIC PANEL
ALT: 28 U/L (ref 0–44)
AST: 25 U/L (ref 15–41)
Albumin: 3.9 g/dL (ref 3.5–5.0)
Alkaline Phosphatase: 47 U/L (ref 38–126)
Anion gap: 8 (ref 5–15)
BUN: 18 mg/dL (ref 6–20)
CO2: 24 mmol/L (ref 22–32)
Calcium: 9.2 mg/dL (ref 8.9–10.3)
Chloride: 103 mmol/L (ref 98–111)
Creatinine, Ser: 1.34 mg/dL — ABNORMAL HIGH (ref 0.61–1.24)
GFR, Estimated: >60 mL/min (ref >60)
Glucose, Bld: 100 mg/dL — ABNORMAL HIGH (ref 70–99)
Potassium: 3.8 mmol/L (ref 3.5–5.1)
Sodium: 135 mmol/L (ref 135–145)
Total Bilirubin: 0.8 mg/dL (ref 0.3–1.2)
Total Protein: 7 g/dL (ref 6.5–8.1)

## 2022-12-29 LAB — TSH: TSH: 1.159 u[IU]/mL (ref 0.350–4.500)

## 2022-12-29 NOTE — Progress Notes (Signed)
Primary Care Physician: Georganna Skeans, MD Primary Cardiologist: Dr Shari Prows Primary Electrophysiologist: Dr Nelly Laurence  Referring Physician: Dr Jen Mow [name] is a 57 y.o. male with a history of CAD s/p CABG 2017, tobacco abuse, HTN, HLD, atrial fibrillation who presents for follow up in the [facility] Atrial Fibrillation Clinic.  The patient was initially diagnosed with atrial fibrillation in 2017 postoperatively following his CABG. He was seen by Dr Shari Prows 02/17/22 and was found to be back in afib at that visit. He was started on Eliquis for a CHADS2VASC score of 2. His amlodipine was also decreased due to low BP and dizziness. He denies significant alcohol use, snores intermittently. Patient is s/p DCCV on 03/23/22.   Patient underwent L cataract surgery on 09/03/22 and was found to be back in rate controlled afib. He underwent DCCV 09/21/22 but was back in afib at his follow up on 10/29/22. He was loaded on amiodarone as a bridge to ablation and had repeat DCCV on 11/15/22.   On follow up today, patient reports that he feels well. He remains in SR. He is tolerating the medication without difficulty. No bleeding issues on anticoagulation.   Today, he denies symptoms of palpitations, chest pain, shortness of breath, orthopnea, PND, lower extremity edema, dizziness, presyncope, syncope, daytime somnolence, bleeding, or neurologic sequela. The patient is tolerating medications without difficulties and is otherwise without complaint today.    Atrial Fibrillation Risk Factors:  he does have symptoms or diagnosis of sleep apnea. he does not have a history of rheumatic fever. he does not have a history of alcohol use. The patient does not have a history of early familial atrial fibrillation or other arrhythmias.   Atrial Fibrillation Management history:  Previous antiarrhythmic drugs: amiodarone (post op) Previous cardioversions: 03/23/22 Previous ablations: none Anticoagulation  history: Eliquis   Past Medical History:  Diagnosis Date   Anxiety    Depression    Hyperlipidemia    Hypertension    Lumbar herniated disc dx'd 03/2015   NSTEMI (non-ST elevated myocardial infarction) (HCC) 04/15/2015    ROS- All systems are reviewed and negative except as per the HPI above.  Physical Exam: Vitals:   12/29/22 1308  BP: 122/80  Pulse: 72  Weight: 106 kg  Height: 6' (1.829 m)    GEN: Well nourished, well developed in no acute distress NECK: No JVD; No carotid bruits CARDIAC: Regular rate and rhythm, no murmurs, rubs, gallops RESPIRATORY:  Clear to auscultation without rales, wheezing or rhonchi  ABDOMEN: Soft, non-tender, non-distended EXTREMITIES:  No edema; No deformity    Wt Readings from Last 3 Encounters:  12/29/22 106 kg  10/29/22 102.7 kg  09/21/22 99.8 kg    EKG today demonstrates  SR Vent. rate 72 BPM PR interval 154 ms QRS duration 98 ms QT/QTcB 414/453 ms   Echo 03/08/22 demonstrated   1. Left ventricular ejection fraction, by estimation, is 60 to 65%. The  left ventricle has normal function. The left ventricle has no regional  wall motion abnormalities. Left ventricular diastolic parameters are  indeterminate.   2. Right ventricular systolic function is normal. The right ventricular  size is normal.   3. Left atrial size was mildly dilated.   4. Right atrial size was mildly dilated.   5. The mitral valve is normal in structure. Mild mitral valve  regurgitation.   6. The aortic valve is tricuspid. Aortic valve regurgitation is not  visualized. Aortic valve sclerosis/calcification is present, without any  evidence of aortic stenosis.   7. The inferior vena cava is dilated in size with <50% respiratory  variability, suggesting right atrial pressure of 15 mmHg.   Epic records are reviewed at length today  CHA2DS2-VASc Score = 2  The patient's score is based upon: CHF History: 0 HTN History: 1 Diabetes History: 0 Stroke  History: 0 Vascular Disease History: 1 Age Score: 0 Gender Score: 0       ASSESSMENT AND PLAN: Persistent Atrial Fibrillation (ICD10:  I48.19) The patient's CHA2DS2-VASc score is 2, indicating a 2.2% annual risk of stroke.   Patient appears to be maintaining SR.  Scheduled for afib ablation 03/31/23 Continue amiodarone 200 mg daily as a bridge to ablation.  Check cmet/TSH today Continue Eliquis 5 mg BID Continue Toprol 50 mg daily  Secondary Hypercoagulable State (ICD10:  D68.69) The patient is at significant risk for stroke/thromboembolism based upon his CHA2DS2-VASc Score of 2.  Continue Apixaban (Eliquis).   CAD S/p CABG 2017 On statin and Plavix No anginal symptoms  HTN Stable on current regimen   Follow up with Dr Anne Fu and Dr Nelly Laurence as scheduled.    Jorja Loa PA-C [facility] [address] [place], [place] [address] [phone] 12/29/2022 2:00 PM

## 2023-01-13 ENCOUNTER — Other Ambulatory Visit: Payer: Self-pay

## 2023-01-13 ENCOUNTER — Other Ambulatory Visit: Payer: Self-pay | Admitting: Family Medicine

## 2023-01-13 DIAGNOSIS — F419 Anxiety disorder, unspecified: Secondary | ICD-10-CM

## 2023-01-13 DIAGNOSIS — F32A Depression, unspecified: Secondary | ICD-10-CM

## 2023-01-13 MED ORDER — FLUOXETINE HCL 10 MG PO TABS: 30.0000 mg | ORAL_TABLET | Freq: Every day | ORAL | 1 refills | Status: DC

## 2023-01-13 MED ORDER — EZETIMIBE 10 MG PO TABS: 10.0000 mg | ORAL_TABLET | Freq: Every day | ORAL | 1 refills | Status: DC

## 2023-01-13 MED ORDER — SPIRONOLACTONE 25 MG PO TABS: 25.0000 mg | ORAL_TABLET | Freq: Every day | ORAL | 1 refills | Status: DC

## 2023-01-13 MED ORDER — ICOSAPENT ETHYL 1 G PO CAPS: 2.0000 g | ORAL_CAPSULE | Freq: Two times a day (BID) | ORAL | 1 refills | Status: DC

## 2023-01-13 NOTE — Addendum Note (Signed)
Addended by: Margaret Pyle D on: 01/13/2023 11:03 AM   Modules accepted: Orders

## 2023-02-15 ENCOUNTER — Ambulatory Visit: Payer: BLUE CROSS/BLUE SHIELD | Admitting: Cardiology

## 2023-02-25 ENCOUNTER — Other Ambulatory Visit: Payer: Self-pay | Admitting: *Deleted

## 2023-02-25 DIAGNOSIS — Z79899 Other long term (current) drug therapy: Secondary | ICD-10-CM

## 2023-02-25 DIAGNOSIS — I2510 Atherosclerotic heart disease of native coronary artery without angina pectoris: Secondary | ICD-10-CM

## 2023-02-25 DIAGNOSIS — Z720 Tobacco use: Secondary | ICD-10-CM

## 2023-02-25 DIAGNOSIS — I10 Essential (primary) hypertension: Secondary | ICD-10-CM

## 2023-02-25 DIAGNOSIS — Z951 Presence of aortocoronary bypass graft: Secondary | ICD-10-CM

## 2023-02-25 DIAGNOSIS — E782 Mixed hyperlipidemia: Secondary | ICD-10-CM

## 2023-02-25 DIAGNOSIS — E785 Hyperlipidemia, unspecified: Secondary | ICD-10-CM

## 2023-02-25 DIAGNOSIS — I4891 Unspecified atrial fibrillation: Secondary | ICD-10-CM

## 2023-02-25 DIAGNOSIS — I779 Disorder of arteries and arterioles, unspecified: Secondary | ICD-10-CM

## 2023-02-25 MED ORDER — APIXABAN 5 MG PO TABS: 5.0000 mg | ORAL_TABLET | Freq: Two times a day (BID) | ORAL | 5 refills | Status: DC

## 2023-02-25 NOTE — Telephone Encounter (Signed)
Eliquis 5mg  refill request received. Patient is 57 years old, weight-106kg, Crea-1.34 on 12/29/22, Diagnosis-Afib, and last seen by Alphonzo Severance on 12/29/22. Dose is appropriate based on dosing criteria. Will send in refill to requested pharmacy.

## 2023-03-01 ENCOUNTER — Other Ambulatory Visit: Payer: Self-pay

## 2023-03-01 DIAGNOSIS — I4819 Other persistent atrial fibrillation: Secondary | ICD-10-CM

## 2023-03-07 ENCOUNTER — Ambulatory Visit: Payer: 59 | Attending: Cardiovascular Disease | Admitting: Cardiovascular Disease

## 2023-03-07 ENCOUNTER — Encounter: Payer: Self-pay | Admitting: Cardiovascular Disease

## 2023-03-07 VITALS — BP 130/82 | HR 62 | Ht 72.0 in | Wt 233.8 lb

## 2023-03-07 DIAGNOSIS — I4819 Other persistent atrial fibrillation: Secondary | ICD-10-CM

## 2023-03-07 NOTE — Patient Instructions (Signed)
 Medication Instructions:  Your physician recommends that you continue on your current medications as directed. Please refer to the Current Medication list given to you today. *If you need a refill on your cardiac medications before your next appointment, please call your pharmacy*   Lab Work: CBC and BMET today If you have labs (blood work) drawn today and your tests are completely normal, you will receive your results only by: MyChart Message (if you have MyChart) OR A paper copy in the mail If you have any lab test that is abnormal or we need to change your treatment, we will call you to review the results.   Testing/Procedures: Atrial Fibrillation Ablation - see instruction letter Your physician has recommended that you have an ablation. Catheter ablation is a medical procedure used to treat some cardiac arrhythmias (irregular heartbeats). During catheter ablation, a long, thin, flexible tube is put into a blood vessel in your groin (upper thigh), or neck. This tube is called an ablation catheter. It is then guided to your heart through the blood vessel. Radio frequency waves destroy small areas of heart tissue where abnormal heartbeats may cause an arrhythmia to start. Please see the instruction sheet given to you today.   Follow-Up: At [facility], you and your health needs are our priority.  As part of our continuing mission to provide you with exceptional heart care, we have created designated Provider Care Teams.  These Care Teams include your primary Cardiologist (physician) and Advanced Practice Providers (APPs -  Physician Assistants and Nurse Practitioners) who all work together to provide you with the care you need, when you need it.  We recommend signing up for the patient portal called "MyChart".  Sign up information is provided on this After Visit Summary.  MyChart is used to connect with patients for Virtual Visits (Telemedicine).  Patients are able to view lab/test  results, encounter notes, upcoming appointments, etc.  Non-urgent messages can be sent to your provider as well.   To learn more about what you can do with MyChart, go to [email or web address].    Your next appointment:   We will schedule follow up after your ablation  Provider:   York Pellant, MD

## 2023-03-07 NOTE — Progress Notes (Signed)
  Electrophysiology Office Note:    Date:  03/07/2023   ID:  [name], DOB 1965/12/19, MRN [id number]  PCP:  Georganna Skeans, MD   [facility] Providers Cardiologist:  Meriam Sprague, MD (Inactive) Electrophysiologist:  [name], MD     Referring MD: Georganna Skeans, MD   History of Present Illness:    [name] is a 57 y.o. male with a medical history significant for CAD s/p CABG in 2017, referred for AF management.     He initially had atrial fibrillation after CABG in 2017. He was noted to be in AF again in follow up with Dr. Shari Prows in Nov 2023.  He underwent DCCV on 03/23/2022. He was found to be in AF again 09/03/2022 at the time of cataract surgery and in follow-up in AF clinic June 21. He was cardioverted again September 21, 2022.   He is again in AF today (10/29/2022).  He does not have palpitations and cannot sense immediately whether he is in A-fib or normal rhythm.  However, he does have significant fatigue while in atrial fibrillation.     Today, he reports that he is at baseline.  He is aware of his atrial fibrillation because he feels more fatigued than usual.   He reports he is doing well today, no recurrence of AF since his last visit. He remains on amiodarone.    EKGs/Labs/Other Studies Reviewed Today:    Echocardiogram:  03/08/2022 EF 60-65%, mild atrial dilation   Monitors:   Stress testing:   Advanced imaging:    Cardiac catherization    EKG:   EKG Interpretation Date/Time:  Monday March 07 2023 15:12:21 EST Ventricular Rate:  62 PR Interval:  148 QRS Duration:  98 QT Interval:  444 QTC Calculation: 450 R Axis:   -10  Text Interpretation: Normal sinus rhythm Normal ECG When compared with ECG of 29-Dec-2022 13:21, No significant change was found Confirmed by York Pellant [phone]) on 03/07/2023 3:24:19 PM     Physical Exam:    VS:  BP 130/82 (BP Location: Left Arm, Patient Position: Sitting, Cuff  Size: Large)   Pulse 62   Ht 6' (1.829 m)   Wt 233 lb 12.8 oz (106.1 kg)   SpO2 95%   BMI 31.71 kg/m     Wt Readings from Last 3 Encounters:  03/07/23 233 lb 12.8 oz (106.1 kg)  12/29/22 233 lb 9.6 oz (106 kg)  10/29/22 226 lb 6.4 oz (102.7 kg)     GEN: Well nourished, well developed in no acute distress CARDIAC: iRRR, no murmurs, rubs, gallops RESPIRATORY:  Normal work of breathing MUSCULOSKELETAL: no edema    ASSESSMENT & PLAN:    Persistent atrial fibrillation Symptomatic with fatigue S/p multiple cardioversions and early recurrence of A-fib We discussed management options and jointly decided to pursue rhythm control by ablation. No changes since his last visit. We are set to proceed with pre-procedural studies -  lab work today [employer]er, [employer]. CBC) and CT Continue amiodarone until ablation.   Secondary hypercoagulable state Continue apixaban for CHADS2Vasc of 2  Coronary artery disease Status post CABG Continue atorvastatin 40, ezetimibe 10, metoprolol XL 50   Signed, [name], MD  03/07/2023 3:39 PM    [facility]

## 2023-03-08 LAB — CBC
Hematocrit: 46.8 % (ref 37.5–51.0)
Hemoglobin: 15.7 g/dL (ref 13.0–17.7)
MCH: 31.5 pg (ref 26.6–33.0)
MCHC: 33.5 g/dL (ref 31.5–35.7)
MCV: 94 fL (ref 79–97)
Platelets: 178 10*3/uL (ref 150–450)
RBC: 4.98 x10E6/uL (ref 4.14–5.80)
RDW: 13.1 % (ref 11.6–15.4)
WBC: 9 10*3/uL (ref 3.4–10.8)

## 2023-03-08 LAB — BASIC METABOLIC PANEL
BUN/Creatinine Ratio: 15 (ref 9–20)
BUN: 21 mg/dL (ref 6–24)
CO2: 20 mmol/L (ref 20–29)
Calcium: 9.1 mg/dL (ref 8.7–10.2)
Chloride: 102 mmol/L (ref 96–106)
Creatinine, Ser: 1.37 mg/dL — ABNORMAL HIGH (ref 0.76–1.27)
Glucose: 92 mg/dL (ref 70–99)
Potassium: 4.3 mmol/L (ref 3.5–5.2)
Sodium: 137 mmol/L (ref 134–144)
eGFR: 59 mL/min/{1.73_m2} — ABNORMAL LOW (ref >59)

## 2023-03-20 NOTE — Pre-Procedure Instructions (Signed)
Attempted to call patient regarding procedure on 1/9 with anesthesia.  You take a medication called Wegovy that has to be held for 7 days before procedure.  Last dose will be Wednesday 1/1 until after procedure.

## 2023-03-21 ENCOUNTER — Ambulatory Visit (HOSPITAL_COMMUNITY)
Admission: RE | Admit: 2023-03-21 | Discharge: 2023-03-21 | Disposition: A | Discharge status: Home / Self Care | Payer: 59 | Source: Ambulatory Visit | Attending: Cardiovascular Disease | Admitting: Cardiovascular Disease

## 2023-03-21 DIAGNOSIS — I480 Paroxysmal atrial fibrillation: Secondary | ICD-10-CM | POA: Insufficient documentation

## 2023-03-21 MED ORDER — IOHEXOL 350 MG/ML SOLN
95.0000 mL | Freq: Once | INTRAVENOUS | Status: AC | PRN
  Administered 2023-03-21: 95 mL via INTRAVENOUS

## 2023-03-22 ENCOUNTER — Other Ambulatory Visit: Payer: Self-pay | Admitting: Family

## 2023-03-22 ENCOUNTER — Other Ambulatory Visit: Payer: Self-pay

## 2023-03-22 DIAGNOSIS — Z951 Presence of aortocoronary bypass graft: Secondary | ICD-10-CM

## 2023-03-22 DIAGNOSIS — I2510 Atherosclerotic heart disease of native coronary artery without angina pectoris: Secondary | ICD-10-CM

## 2023-03-22 DIAGNOSIS — E785 Hyperlipidemia, unspecified: Secondary | ICD-10-CM

## 2023-03-22 DIAGNOSIS — I10 Essential (primary) hypertension: Secondary | ICD-10-CM

## 2023-03-22 DIAGNOSIS — K219 Gastro-esophageal reflux disease without esophagitis: Secondary | ICD-10-CM

## 2023-03-22 DIAGNOSIS — E782 Mixed hyperlipidemia: Secondary | ICD-10-CM

## 2023-03-22 DIAGNOSIS — Z79899 Other long term (current) drug therapy: Secondary | ICD-10-CM

## 2023-03-22 MED ORDER — ATORVASTATIN CALCIUM 40 MG PO TABS: 40.0000 mg | ORAL_TABLET | Freq: Every day | ORAL | 0 refills | Status: DC

## 2023-03-22 MED ORDER — LISINOPRIL 40 MG PO TABS: 40.0000 mg | ORAL_TABLET | Freq: Every day | ORAL | 0 refills | Status: DC

## 2023-03-22 MED ORDER — HYDROCHLOROTHIAZIDE 25 MG PO TABS: 25.0000 mg | ORAL_TABLET | Freq: Every day | ORAL | 0 refills | Status: DC

## 2023-03-22 MED ORDER — FENOFIBRATE 145 MG PO TABS: 145.0000 mg | ORAL_TABLET | Freq: Every day | ORAL | 0 refills | Status: DC

## 2023-03-22 MED ORDER — METOPROLOL SUCCINATE ER 50 MG PO TB24: 50.0000 mg | ORAL_TABLET | Freq: Every day | ORAL | 0 refills | Status: DC

## 2023-03-30 NOTE — Pre-Procedure Instructions (Signed)
 Instructed patient on the following items: Arrival time 0515 Nothing to eat or drink after midnight No meds AM of procedure Responsible person to drive you home and stay with you for 24 hrs  Have you missed any doses of anti-coagulant Eliquis- takes twice a day, hasn't missed any doses.  Don't take dose in the morning.

## 2023-03-30 NOTE — Anesthesia Preprocedure Evaluation (Addendum)
 Anesthesia Evaluation  Patient identified by MRN, date of birth, ID band Patient awake    Reviewed: Allergy & Precautions, NPO status , Patient's Chart, lab work & pertinent test results, reviewed documented beta blocker date and time   Airway Mallampati: III  TM Distance: >3 FB Neck ROM: Full    Dental  (+) Dental Advisory Given, Edentulous Upper, Edentulous Lower   Pulmonary Current SmokerPatient did not abstain from smoking.   Pulmonary exam normal breath sounds clear to auscultation       Cardiovascular hypertension, Pt. on home beta blockers and Pt. on medications (-) angina + CAD, + Past MI and + CABG (2017)  Normal cardiovascular exam+ dysrhythmias Atrial Fibrillation  Rhythm:Regular Rate:Normal     Neuro/Psych  PSYCHIATRIC DISORDERS Anxiety Depression     Neuromuscular disease    GI/Hepatic Neg liver ROS,GERD  ,,  Endo/Other  Obesity   Renal/GU negative Renal ROS     Musculoskeletal negative musculoskeletal ROS (+)    Abdominal   Peds  Hematology  (+) Blood dyscrasia (Eliquis )   Anesthesia Other Findings   Reproductive/Obstetrics                             Anesthesia Physical Anesthesia Plan  ASA: 3  Anesthesia Plan: General   Post-op Pain Management: Tylenol  PO (pre-op )*   Induction: Intravenous  PONV Risk Score and Plan: 1 and Midazolam , Dexamethasone  and Ondansetron   Airway Management Planned: Oral ETT  Additional Equipment:   Intra-op Plan:   Post-operative Plan: Extubation in OR  Informed Consent: I have reviewed the patients History and Physical, chart, labs and discussed the procedure including the risks, benefits and alternatives for the proposed anesthesia with the patient or authorized representative who has indicated his/her understanding and acceptance.     Dental advisory given  Plan Discussed with: CRNA  Anesthesia Plan Comments:         Anesthesia Quick Evaluation

## 2023-03-31 ENCOUNTER — Other Ambulatory Visit: Payer: Self-pay

## 2023-03-31 ENCOUNTER — Ambulatory Visit (HOSPITAL_COMMUNITY): Payer: 59 | Admitting: Anesthesiology

## 2023-03-31 ENCOUNTER — Ambulatory Visit (HOSPITAL_BASED_OUTPATIENT_CLINIC_OR_DEPARTMENT_OTHER): Payer: 59 | Admitting: Anesthesiology

## 2023-03-31 ENCOUNTER — Ambulatory Visit (HOSPITAL_COMMUNITY)
Admission: RE | Disposition: A | Discharge status: Home / Self Care | Payer: 59 | Source: Home / Self Care | Attending: Cardiovascular Disease

## 2023-03-31 ENCOUNTER — Ambulatory Visit (HOSPITAL_COMMUNITY)
Admission: RE | Admit: 2023-03-31 | Discharge: 2023-03-31 | Disposition: A | Discharge status: Home / Self Care | Payer: 59 | Attending: Cardiovascular Disease | Admitting: Cardiovascular Disease

## 2023-03-31 DIAGNOSIS — F32A Depression, unspecified: Secondary | ICD-10-CM | POA: Diagnosis not present

## 2023-03-31 DIAGNOSIS — I10 Essential (primary) hypertension: Secondary | ICD-10-CM

## 2023-03-31 DIAGNOSIS — D6869 Other thrombophilia: Secondary | ICD-10-CM | POA: Insufficient documentation

## 2023-03-31 DIAGNOSIS — Z7901 Long term (current) use of anticoagulants: Secondary | ICD-10-CM | POA: Insufficient documentation

## 2023-03-31 DIAGNOSIS — I4891 Unspecified atrial fibrillation: Secondary | ICD-10-CM | POA: Diagnosis not present

## 2023-03-31 DIAGNOSIS — Z79899 Other long term (current) drug therapy: Secondary | ICD-10-CM | POA: Diagnosis not present

## 2023-03-31 DIAGNOSIS — Z951 Presence of aortocoronary bypass graft: Secondary | ICD-10-CM | POA: Diagnosis not present

## 2023-03-31 DIAGNOSIS — I2510 Atherosclerotic heart disease of native coronary artery without angina pectoris: Secondary | ICD-10-CM

## 2023-03-31 DIAGNOSIS — I4819 Other persistent atrial fibrillation: Secondary | ICD-10-CM | POA: Diagnosis present

## 2023-03-31 DIAGNOSIS — F17200 Nicotine dependence, unspecified, uncomplicated: Secondary | ICD-10-CM | POA: Diagnosis not present

## 2023-03-31 DIAGNOSIS — K219 Gastro-esophageal reflux disease without esophagitis: Secondary | ICD-10-CM | POA: Insufficient documentation

## 2023-03-31 DIAGNOSIS — F419 Anxiety disorder, unspecified: Secondary | ICD-10-CM | POA: Insufficient documentation

## 2023-03-31 HISTORY — PX: ATRIAL FIBRILLATION ABLATION: EP1191

## 2023-03-31 LAB — POCT ACTIVATED CLOTTING TIME: Activated Clotting Time: 320 s

## 2023-03-31 SURGERY — ATRIAL FIBRILLATION ABLATION
Anesthesia: General

## 2023-03-31 MED ORDER — SODIUM CHLORIDE 0.9 % IV SOLN: 250.0000 mL | INTRAVENOUS | Status: DC | PRN

## 2023-03-31 MED ORDER — DEXAMETHASONE SODIUM PHOSPHATE 10 MG/ML IJ SOLN
INTRAMUSCULAR | Status: DC | PRN
  Administered 2023-03-31: 10 mg via INTRAVENOUS

## 2023-03-31 MED ORDER — SUGAMMADEX SODIUM 200 MG/2ML IV SOLN
INTRAVENOUS | Status: DC | PRN
  Administered 2023-03-31: 200 mg via INTRAVENOUS

## 2023-03-31 MED ORDER — PHENYLEPHRINE HCL-NACL 20-0.9 MG/250ML-% IV SOLN
INTRAVENOUS | Status: DC | PRN
  Administered 2023-03-31: 30 ug/min via INTRAVENOUS

## 2023-03-31 MED ORDER — FENTANYL CITRATE (PF) 250 MCG/5ML IJ SOLN
INTRAMUSCULAR | Status: DC | PRN
  Administered 2023-03-31: 50 ug via INTRAVENOUS

## 2023-03-31 MED ORDER — ATROPINE SULFATE 0.4 MG/ML IV SOLN
INTRAVENOUS | Status: DC | PRN
  Administered 2023-03-31: 1 mg via INTRAVENOUS

## 2023-03-31 MED ORDER — ROCURONIUM BROMIDE 10 MG/ML (PF) SYRINGE
PREFILLED_SYRINGE | INTRAVENOUS | Status: DC | PRN
  Administered 2023-03-31: 10 mg via INTRAVENOUS
  Administered 2023-03-31: 60 mg via INTRAVENOUS

## 2023-03-31 MED ORDER — ALBUMIN HUMAN 5 % IV SOLN: INTRAVENOUS | Status: DC | PRN

## 2023-03-31 MED ORDER — ONDANSETRON HCL 4 MG/2ML IJ SOLN: 4.0000 mg | Freq: Four times a day (QID) | INTRAMUSCULAR | Status: DC | PRN

## 2023-03-31 MED ORDER — HEPARIN SODIUM (PORCINE) 1000 UNIT/ML IJ SOLN
INTRAMUSCULAR | Status: AC
  Filled 2023-03-31: qty 20

## 2023-03-31 MED ORDER — HEPARIN SODIUM (PORCINE) 1000 UNIT/ML IJ SOLN
INTRAMUSCULAR | Status: DC | PRN
  Administered 2023-03-31: 18000 [IU] via INTRAVENOUS

## 2023-03-31 MED ORDER — EPHEDRINE SULFATE-NACL 50-0.9 MG/10ML-% IV SOSY
PREFILLED_SYRINGE | INTRAVENOUS | Status: DC | PRN
  Administered 2023-03-31 (×2): 5 mg via INTRAVENOUS
  Administered 2023-03-31: 10 mg via INTRAVENOUS

## 2023-03-31 MED ORDER — SODIUM CHLORIDE 0.9% FLUSH: 3.0000 mL | INTRAVENOUS | Status: DC | PRN

## 2023-03-31 MED ORDER — PROTAMINE SULFATE 10 MG/ML IV SOLN
INTRAVENOUS | Status: DC | PRN
  Administered 2023-03-31: 35 mg via INTRAVENOUS
  Administered 2023-03-31: 15 mg via INTRAVENOUS

## 2023-03-31 MED ORDER — FENTANYL CITRATE (PF) 100 MCG/2ML IJ SOLN
INTRAMUSCULAR | Status: AC
  Filled 2023-03-31: qty 2

## 2023-03-31 MED ORDER — ACETAMINOPHEN 325 MG PO TABS: 650.0000 mg | ORAL_TABLET | ORAL | Status: DC | PRN

## 2023-03-31 MED ORDER — SODIUM CHLORIDE 0.9 % IV SOLN: INTRAVENOUS | Status: DC

## 2023-03-31 MED ORDER — PROPOFOL 10 MG/ML IV BOLUS
INTRAVENOUS | Status: DC | PRN
  Administered 2023-03-31: 160 mg via INTRAVENOUS

## 2023-03-31 MED ORDER — ACETAMINOPHEN 500 MG PO TABS
1000.0000 mg | ORAL_TABLET | Freq: Once | ORAL | Status: AC
  Administered 2023-03-31: 1000 mg via ORAL
  Filled 2023-03-31: qty 2

## 2023-03-31 MED ORDER — ONDANSETRON HCL 4 MG/2ML IJ SOLN
INTRAMUSCULAR | Status: DC | PRN
  Administered 2023-03-31: 4 mg via INTRAVENOUS

## 2023-03-31 MED ORDER — ATROPINE SULFATE 1 MG/10ML IJ SOSY
PREFILLED_SYRINGE | INTRAMUSCULAR | Status: AC
  Filled 2023-03-31: qty 10

## 2023-03-31 MED ORDER — LIDOCAINE 2% (20 MG/ML) 5 ML SYRINGE
INTRAMUSCULAR | Status: DC | PRN
  Administered 2023-03-31: 100 mg via INTRAVENOUS

## 2023-03-31 MED ORDER — HEPARIN (PORCINE) IN NACL 1000-0.9 UT/500ML-% IV SOLN
INTRAVENOUS | Status: DC | PRN
  Administered 2023-03-31 (×3): 500 mL

## 2023-03-31 SURGICAL SUPPLY — 22 items
BAG SNAP BAND KOVER 36X36 (MISCELLANEOUS) IMPLANT
BLANKET WARM UNDERBOD FULL ACC (MISCELLANEOUS) ×1 IMPLANT
CABLE PFA RX CATH CONN (CABLE) IMPLANT
CATH FARAWAVE ABLATION 31 (CATHETERS) IMPLANT
CATH OCTARAY 2.0 F 3-3-3-3-3 (CATHETERS) IMPLANT
CATH SOUNDSTAR ECO 8FR (CATHETERS) IMPLANT
CATH WEBSTER BI DIR CS D-F CRV (CATHETERS) IMPLANT
CLOSURE PERCLOSE PROSTYLE (VASCULAR PRODUCTS) IMPLANT
COVER SWIFTLINK CONNECTOR (BAG) ×1 IMPLANT
DEVICE CLOSURE MYNXGRIP 6/7F (Vascular Products) IMPLANT
DILATOR VESSEL 38 20CM 16FR (INTRODUCER) IMPLANT
GUIDEWIRE INQWIRE 1.5J.035X260 (WIRE) IMPLANT
INQWIRE 1.5J .035X260CM (WIRE) ×1 IMPLANT
KIT VERSACROSS CNCT FARADRIVE (KITS) IMPLANT
MAT PREVALON FULL STRYKER (MISCELLANEOUS) IMPLANT
PACK EP LF (CUSTOM PROCEDURE TRAY) ×1 IMPLANT
PAD DEFIB RADIO PHYSIO CONN (PAD) ×1 IMPLANT
PATCH CARTO3 (PAD) IMPLANT
SHEATH FARADRIVE STEERABLE (SHEATH) IMPLANT
SHEATH PINNACLE 8F 10CM (SHEATH) IMPLANT
SHEATH PINNACLE 9F 10CM (SHEATH) IMPLANT
SHEATH PROBE COVER 6X72 (BAG) IMPLANT

## 2023-03-31 NOTE — Anesthesia Procedure Notes (Signed)
 Procedure Name: Intubation Date/Time: 03/31/2023 7:58 AM  Performed by: Delores Dus, CRNAPre-anesthesia Checklist: Patient identified, Emergency Drugs available, Suction available and Patient being monitored Patient Re-evaluated:Patient Re-evaluated prior to induction Oxygen Delivery Method: Circle system utilized Preoxygenation: Pre-oxygenation with 100% oxygen Induction Type: IV induction Ventilation: Mask ventilation without difficulty Laryngoscope Size: Miller and 2 Grade View: Grade II Tube type: Oral Tube size: 7.0 mm Number of attempts: 1 Airway Equipment and Method: Stylet and Oral airway Placement Confirmation: ETT inserted through vocal cords under direct vision, positive ETCO2 and breath sounds checked- equal and bilateral Secured at: 22 cm Tube secured with: Tape Dental Injury: Teeth and Oropharynx as per pre-operative assessment

## 2023-03-31 NOTE — Discharge Instructions (Signed)

## 2023-03-31 NOTE — Anesthesia Postprocedure Evaluation (Signed)
 Anesthesia Post Note  Patient: [name]  Procedure(s) Performed: ATRIAL FIBRILLATION ABLATION     Patient location during evaluation: PACU Anesthesia Type: General Level of consciousness: awake and alert Pain management: pain level controlled Vital Signs Assessment: post-procedure vital signs reviewed and stable Respiratory status: spontaneous breathing, nonlabored ventilation, respiratory function stable and patient connected to nasal cannula oxygen Cardiovascular status: blood pressure returned to baseline and stable Postop Assessment: no apparent nausea or vomiting Anesthetic complications: no   No notable events documented.  Last Vitals:  Vitals:   03/31/23 1300 03/31/23 1306  BP: (!) 140/88   Pulse: (!) 57 61  Resp: 15 14  Temp:    SpO2: 93% 93%    Last Pain:  Vitals:   03/31/23 1022  TempSrc:   PainSc: 0-No pain                 Garnette FORBES Skillern

## 2023-03-31 NOTE — Transfer of Care (Signed)
 Immediate Anesthesia Transfer of Care Note  Patient: [name]  Procedure(s) Performed: ATRIAL FIBRILLATION ABLATION  Patient Location: PACU  Anesthesia Type:General  Level of Consciousness: awake, alert , and oriented  Airway & Oxygen Therapy: Patient Spontanous Breathing and Patient connected to nasal cannula oxygen  Post-op Assessment: Report given to RN and Post -op Vital signs reviewed and stable  Post vital signs: Reviewed and stable  Last Vitals:  Vitals Value Taken Time  BP 150/86 03/31/23 0940  Temp 37.1 C 03/31/23 0937  Pulse 74 03/31/23 0941  Resp 13 03/31/23 0941  SpO2 95 % 03/31/23 0941  Vitals shown include unfiled device data.  Last Pain:  Vitals:   03/31/23 0937  TempSrc: Oral  PainSc: 0-No pain         Complications: No notable events documented.

## 2023-03-31 NOTE — Interval H&P Note (Signed)
 History and Physical Interval Note:  03/31/2023 7:07 AM  [name]  has presented today for surgery, with the diagnosis of afib.  The various methods of treatment have been discussed with the patient and family. After consideration of risks, benefits and other options for treatment, the patient has consented to  Procedure(s): ATRIAL FIBRILLATION ABLATION ([name]) as a surgical intervention.  The patient's history has been reviewed, patient examined, no change in status, stable for surgery.  I have reviewed the patient's chart and labs.  Questions were answered to the patient's satisfaction.    I reviewed the patient's CT and labs. There was no LAA thrombus. he  has not missed any doses of anticoagulation, and he took his dose last night. There have been no changes in the patient's diagnoses, medications, or condition since our recent clinic visit.   [name] E [name]

## 2023-04-01 ENCOUNTER — Encounter (HOSPITAL_COMMUNITY): Payer: Self-pay | Admitting: Cardiovascular Disease

## 2023-04-01 ENCOUNTER — Encounter: Payer: Self-pay | Admitting: Cardiovascular Disease

## 2023-04-01 MED FILL — Atropine Sulfate Soln Prefill Syr 1 MG/10ML (0.1 MG/ML): INTRAMUSCULAR | Qty: 10 | Status: AC

## 2023-04-01 MED FILL — Fentanyl Citrate Preservative Free (PF) Inj 100 MCG/2ML: INTRAMUSCULAR | Qty: 1 | Status: AC

## 2023-04-01 NOTE — Telephone Encounter (Signed)
 Spoke with patient, letter in MyChart, no further needs

## 2023-04-05 ENCOUNTER — Ambulatory Visit: Payer: 59 | Admitting: Family

## 2023-04-05 ENCOUNTER — Encounter: Payer: Self-pay | Admitting: Family

## 2023-04-05 VITALS — BP 127/86 | HR 75 | Temp 98.5°F | Ht 72.0 in | Wt 235.0 lb

## 2023-04-05 DIAGNOSIS — R251 Tremor, unspecified: Secondary | ICD-10-CM | POA: Diagnosis not present

## 2023-04-05 DIAGNOSIS — F32A Depression, unspecified: Secondary | ICD-10-CM | POA: Diagnosis not present

## 2023-04-05 DIAGNOSIS — F419 Anxiety disorder, unspecified: Secondary | ICD-10-CM | POA: Diagnosis not present

## 2023-04-05 DIAGNOSIS — K219 Gastro-esophageal reflux disease without esophagitis: Secondary | ICD-10-CM | POA: Diagnosis not present

## 2023-04-05 MED ORDER — PANTOPRAZOLE SODIUM 40 MG PO TBEC: 40.0000 mg | DELAYED_RELEASE_TABLET | Freq: Every day | ORAL | 0 refills | Status: DC

## 2023-04-05 MED ORDER — FLUOXETINE HCL 10 MG PO TABS: 30.0000 mg | ORAL_TABLET | Freq: Every day | ORAL | 0 refills | Status: DC

## 2023-04-05 NOTE — Progress Notes (Signed)
 Wants to know why he is shaking so much. States it's been going on for a while and it's getting worst.   Wants Flu vaccine.

## 2023-04-05 NOTE — Progress Notes (Signed)
 Patient ID: [name], male    DOB: November 19, 1965  MRN: [id number]  CC: Medication Refills   Subjective: [name] is a 58 y.o. male who presents for medication refills.   His concerns today include:  - Doing well on Fluoxetine , no issues/concerns. He denies thoughts of self-harm, suicidal ideations, homicidal ideations. - Doing well on Pantoprazole , no issues/concerns.  - States shaking of extremities persisting for a while. Denies red flag symptoms.   Patient Active Problem List   Diagnosis Date Noted   Elevated fasting blood sugar 08/03/2022   Persistent atrial fibrillation (HCC) 03/10/2022   Hypercoagulable state due to persistent atrial fibrillation (HCC) 03/10/2022   Benign prostatic hyperplasia with lower urinary tract symptoms 07/30/2021   Gastroesophageal reflux disease 07/30/2021   Generalized anxiety disorder 07/30/2021   Impaired fasting glucose 07/30/2021   Atherosclerotic heart disease of native coronary artery without angina pectoris 07/30/2021   Hypertension 07/30/2021   Obese 08/21/2020   Lumbar radiculopathy 12/18/2018   Hyperlipidemia 05/05/2016   Coronary artery disease due to lipid rich plaque 08/07/2015   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     amiodarone  (PACERONE ) 200 MG tablet Take 1 tablet by mouth twice daily for 14 days, then decrease to 1 tablet ONCE daily thereafter (Patient taking differently: Take 200 mg by mouth daily.) 90 tablet 3   apixaban  (ELIQUIS ) 5 MG TABS tablet Take 1 tablet (5 mg total) by mouth 2 (two) times daily. 60 tablet 5   atorvastatin  (LIPITOR ) 40 MG tablet Take 1 tablet (40 mg total) by mouth daily. 90 tablet 0   ezetimibe  (ZETIA ) 10 MG tablet Take 1 tablet (10 mg total) by mouth daily. 90 tablet 1   fenofibrate  (TRICOR ) 145 MG tablet Take 1 tablet (145 mg total) by mouth daily. 90 tablet 0   finasteride (PROSCAR) 5 MG tablet TAKE 1 TABLET BY MOUTH EVERY DAY 90 tablet 3   hydrochlorothiazide  (HYDRODIURIL ) 25 MG tablet Take 1 tablet (25 mg total) by mouth daily. 90 tablet 0   icosapent  Ethyl (VASCEPA ) 1 g capsule Take 2 capsules (2 g total) by mouth 2 (two) times daily. 360 capsule 1   isosorbide  mononitrate (IMDUR ) 60 MG 24 hr tablet Take 1.5 tablets (90 mg total) by mouth daily. (Patient taking differently: Take 90 mg by mouth every evening.) 135 tablet 3   levocetirizine (XYZAL) 5 MG tablet Take 5 mg by mouth daily as needed for allergies.     lisinopril  (ZESTRIL ) 40 MG tablet Take 1 tablet (40 mg total) by mouth daily. 90 tablet 0   metoprolol  succinate (TOPROL -XL) 50 MG 24 hr tablet Take 1 tablet (50 mg total) by mouth daily. Take with or immediately following a meal. 90 tablet 0   nitroGLYCERIN  (NITROSTAT ) 0.4 MG  SL tablet Place 1 tablet (0.4 mg total) under the tongue every 5 (five) minutes x 3 doses as needed for chest pain. 25 tablet 2   silodosin (RAPAFLO) 8 MG CAPS capsule Take 8 mg by mouth every evening.     spironolactone  (ALDACTONE ) 25 MG tablet Take 1 tablet (25 mg total) by mouth daily. 90 tablet 1   WEGOVY  2.4 MG/0.75ML SOAJ Inject 2.4 mg into the skin once a week. (Patient taking differently: Inject 2.4 mg into the skin every Friday.) 3 mL 11   No current  facility-administered medications on file prior to visit.    Allergies  Allergen Reactions   Atorvastatin  Other (See Comments)    80 mg dose legs cramps, tolerates 40 mg dose   Claritin [Loratadine] Other (See Comments)    Makes allergies worse-a lot worse   Codeine Nausea And Vomiting   Vicodin [Hydrocodone-Acetaminophen ] Nausea And Vomiting    pretty much any codeine    Social History   Socioeconomic History   Marital status: Married    Spouse name: Not on file   Number of children: Not on file   Years of education: Not on file   Highest education level: Not on file  Occupational History   Not on file  Tobacco Use   Smoking status: Every Day    Current packs/day: 1.00    Average packs/day: 1 pack/day for 6.0 years (6.0 ttl pk-yrs)    Types: Cigarettes   Smokeless tobacco: Never   Tobacco comments:    Currently smoke 1 pack daily 04/08/22  Vaping Use   Vaping status: Never Used  Substance and Sexual Activity   Alcohol use: Yes    Alcohol/week: 0.0 standard drinks of alcohol    Comment: occasionally   Drug use: Not Currently    Types: Marijuana, Cocaine    Comment: in my teens   Sexual activity: Not on file  Other Topics Concern   Not on file  Social History Narrative   Not on file   Social Drivers of Health   Financial Resource Strain: Not on file  Food Insecurity: No Food Insecurity (10/28/2021)   Hunger Vital Sign    Worried About Running Out of Food in the Last Year: Never true    Ran Out of Food in the Last Year: Never true  Transportation Needs: No Transportation Needs (10/28/2021)   PRAPARE - Administrator, Civil Service (Medical): No    Lack of Transportation (Non-Medical): No  Physical Activity: Not on file  Stress: Not on file  Social Connections: Not on file  Intimate Partner Violence: Not on file    Family History  Problem Relation Age of Onset   Heart attack Father 73       Deceased at this age   Cancer Father        Lung    Diabetes Sister     Past Surgical History:  Procedure Laterality Date   ATRIAL FIBRILLATION ABLATION [name] 03/31/2023   Procedure: ATRIAL FIBRILLATION ABLATION;  Surgeon: Nancey, Eulas BRAVO, MD;  Location: [facility] INVASIVE CV [facility];  Service: Cardiovascular;  Laterality: [name];   BACK SURGERY     CARDIAC CATHETERIZATION  04/15/2015   CARDIAC CATHETERIZATION [name] 04/15/2015   Procedure: Left Heart Cath and Coronary Angiography;  Surgeon: Dorn JINNY Lesches, MD;  Location: [facility] INVASIVE CV LAB;  Service: Cardiovascular;  Laterality: [name];   CARDIAC CATHETERIZATION [name] 05/20/2015   Procedure: Left Heart Cath and Cors/Grafts Angiography;  Surgeon: Maude  M Jordan, MD;  Location: [facility] INVASIVE CV LAB;  Service: Cardiovascular;  Laterality: [name];   CARDIOVERSION [name] 03/23/2022   Procedure: CARDIOVERSION;  Surgeon: Santo Stanly LABOR, MD;  Location: [facility] ENDOSCOPY;  Service: Cardiovascular;  Laterality: [name];   CARDIOVERSION [name] 09/21/2022   Procedure: CARDIOVERSION;  Surgeon: Alvan Ronal BRAVO, MD;  Location: [facility] INVASIVE CV LAB;  Service: Cardiovascular;  Laterality: [name];   CORONARY ARTERY BYPASS GRAFT [name] 04/18/2015   Procedure: CORONARY ARTERY BYPASS GRAFTING times four with LIMA  to LAD, LEFT RADIAL ARTERY to OM, Right saphenous vein harvesting and utilized to Interm and to PD ENDOSCOPIC GREATER SAPHENOUS VEIN HARVEST (EVH) RIGHT THIGH;  Surgeon: Dallas KATHEE Jude, MD;  Location: [facility] OR;  Service: Open Heart Surgery;  Laterality: [name];   LUMBAR LAMINECTOMY  2002   RADIAL ARTERY HARVEST Left 04/18/2015   Procedure:  LEFT Arm RADIAL ARTERY HARVEST;  Surgeon: Dallas KATHEE Jude, MD;  Location: [facility] OR;  Service: Open Heart Surgery;  Laterality: Left;   TEE WITHOUT CARDIOVERSION [name] 04/18/2015   Procedure: TRANSESOPHAGEAL ECHOCARDIOGRAM (TEE);  Surgeon: Dallas KATHEE Jude, MD;  Location: [facility] OR;  Service: Open Heart Surgery;  Laterality: [name];   TONSILLECTOMY  1960s    ROS: Review of Systems Negative except as stated  above  PHYSICAL EXAM: BP 127/86   Pulse 75   Temp 98.5 F (36.9 C) (Oral)   Ht 6' (1.829 m)   Wt 235 lb (106.6 kg)   SpO2 96%   BMI 31.87 kg/m   Physical Exam HENT:     Head: Normocephalic and atraumatic.     Nose: Nose normal.     Mouth/Throat:     Mouth: Mucous membranes are moist.     Pharynx: Oropharynx is clear.  Eyes:     Extraocular Movements: Extraocular movements intact.     Conjunctiva/sclera: Conjunctivae normal.     Pupils: Pupils are equal, round, and reactive to light.  Cardiovascular:     Rate and Rhythm: Normal rate and regular rhythm.     Pulses: Normal pulses.     Heart sounds: Normal heart sounds.  Pulmonary:     Effort: Pulmonary effort is normal.     Breath sounds: Normal breath sounds.  Musculoskeletal:        General: Normal range of motion.     Cervical back: Normal range of motion and neck supple.  Neurological:     General: No focal deficit present.     Mental Status: He is alert and oriented to person, place, and time.  Psychiatric:        Mood and Affect: Mood normal.        Behavior: Behavior normal.     ASSESSMENT AND PLAN: 1. Anxiety and depression (Primary) - Patient denies thoughts of self-harm, suicidal ideations, homicidal ideations. - Continue Fluoxetine  as prescribed. Counseled on medication adherence/adverse effects.  - Follow-up with primary provider in 3 months or sooner if needed.  - FLUoxetine  (PROZAC ) 10 MG tablet; Take 3 tablets (30 mg total) by mouth daily.  Dispense: 270 tablet; Refill: 0  2. Gastroesophageal reflux disease without esophagitis - Continue Pantoprazole  as prescribed. Counseled on medication adherence/adverse effects. - Follow-up with primary provider in 3 months or sooner if needed.  - pantoprazole  (PROTONIX ) 40 MG tablet; Take 1 tablet (40 mg total) by mouth daily.  Dispense: 90 tablet; Refill: 0  3. Tremors of nervous system - Referral to Neurology for evaluation/management. - Ambulatory referral  to Neurology   Patient was  given the opportunity to ask questions.  Patient verbalized understanding of the plan and was able to repeat key elements of the plan. Patient was given clear instructions to go to Emergency Department or return to medical center if symptoms don't improve, worsen, or new problems develop.The patient verbalized understanding.   Orders Placed This Encounter  Procedures   Ambulatory referral to Neurology     Requested Prescriptions   Signed Prescriptions Disp Refills   pantoprazole  (PROTONIX ) 40 MG tablet 90 tablet 0    Sig: Take 1 tablet (40 mg total) by mouth daily.   FLUoxetine  (PROZAC ) 10 MG tablet 270 tablet 0    Sig: Take 3 tablets (30 mg total) by mouth daily.    Return in about 1 week (around 04/12/2023) for Follow-Up or next available with Raguel Blush, MD.  Greig JINNY Drones, NP

## 2023-04-13 NOTE — Progress Notes (Unsigned)
Assessment/Plan:   1.  Tremor.  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  He has an appointment next month to discuss with his new cardiologist.  -We discussed nature and pathophysiology.  We discussed that this can continue to gradually get worse with time.  We discussed that some medications can worsen this, as can caffeine use.  We discussed medication therapy as well as surgical therapy.  Unfortunately, the patient cannot be on primidone because of the fact he is on apixaban for atrial fibrillation.  Patient is also already on a beta-blocker (metoprolol) for rate control.  This really takes out both of the first-line drugs for tremor.  However, he does state that he was told that he will go up apixaban when he does his follow-up appointment with his cardiologist the next month.  He just recently had a cardioversion.  He is going to wait to start any medications here until he discusses with cardiology, and then we will follow-up with him after that visit.  We did discuss surgical interventions for tremor, but he really is not interested in those.     Subjective:   [name] was seen in consultation in the movement disorder clinic at the request of Rema Fendt, NP.  The evaluation is for tremor.   Outside records that were made available to me were reviewed.   Very few notes about tremor are seen.   Tremor started approximately 10 years ago, worse over the last 2 years and definitely worse over the last weeks and involves the bilateral UE, R>L.  Tremor is most noticeable when he is using the hands.  He notes it esp with cooking.   There is no family hx of tremor.    Affected by caffeine:  No. (2 cups coffee/day) Affected by alcohol:  doesn't drink any Affected by stress:  No. Affected by fatigue:  No. Spills soup if on spoon:  Yes.   Spills glass of liquid if full:  Yes.   Affects ADL's (tying shoes, brushing teeth, etc):  No.  Current/Previously tried tremor  medications: no  Current medications that may exacerbate tremor:  amiodarone (been on since 10/2022)  Outside reports reviewed: historical medical records, office notes, and referral letter/letters.  He has not had prior neuroimaging of the brain.  Allergies  Allergen Reactions   Atorvastatin Other (See Comments)    80 mg dose legs cramps, tolerates 40 mg dose   Claritin [Loratadine] Other (See Comments)    Makes allergies worse-"a lot worse"   Codeine Nausea And Vomiting   Vicodin [Hydrocodone-Acetaminophen] Nausea And Vomiting    "pretty much any codeine"    Current Meds  Medication Sig   acetaminophen (TYLENOL) 500 MG tablet Take 500 mg by mouth every 6 (six) hours as needed for moderate pain.   amiodarone (PACERONE) 200 MG tablet Take 1 tablet by mouth twice daily for 14 days, then decrease to 1 tablet ONCE daily thereafter   apixaban (ELIQUIS) 5 MG TABS tablet Take 1 tablet (5 mg total) by mouth 2 (two) times daily.   atorvastatin (LIPITOR) 40 MG tablet Take 1 tablet (40 mg total) by mouth daily.   ezetimibe (ZETIA) 10 MG tablet Take 1 tablet (10 mg total) by mouth daily.   fenofibrate (TRICOR) 145 MG tablet Take 1 tablet (145 mg total) by mouth daily.   finasteride (PROSCAR) 5 MG tablet TAKE 1 TABLET BY MOUTH EVERY DAY   FLUoxetine (PROZAC) 10 MG tablet Take 3 tablets (30 mg total)  by mouth daily.   hydrochlorothiazide (HYDRODIURIL) 25 MG tablet Take 1 tablet (25 mg total) by mouth daily.   icosapent Ethyl (VASCEPA) 1 g capsule Take 2 capsules (2 g total) by mouth 2 (two) times daily.   isosorbide mononitrate (IMDUR) 60 MG 24 hr tablet Take 1.5 tablets (90 mg total) by mouth daily. (Patient taking differently: Take 90 mg by mouth every evening.)   levocetirizine (XYZAL) 5 MG tablet Take 5 mg by mouth daily as needed for allergies.   lisinopril (ZESTRIL) 40 MG tablet Take 1 tablet (40 mg total) by mouth daily.   metoprolol succinate (TOPROL-XL) 50 MG 24 hr tablet Take 1 tablet (50  mg total) by mouth daily. Take with or immediately following a meal.   nitroGLYCERIN (NITROSTAT) 0.4 MG SL tablet Place 1 tablet (0.4 mg total) under the tongue every 5 (five) minutes x 3 doses as needed for chest pain.   pantoprazole (PROTONIX) 40 MG tablet Take 1 tablet (40 mg total) by mouth daily.   silodosin (RAPAFLO) 8 MG CAPS capsule Take 8 mg by mouth every evening.   spironolactone (ALDACTONE) 25 MG tablet Take 1 tablet (25 mg total) by mouth daily.   WEGOVY 2.4 MG/0.75ML SOAJ Inject 2.4 mg into the skin once a week. (Patient taking differently: Inject 2.4 mg into the skin every Friday.)     Objective:   VITALS:   Vitals:   04/14/23 1312  BP: 118/82  Pulse: 71  SpO2: 98%  Height: 6' (1.829 m)   Gen:  Appears stated age and in NAD. HEENT:  Normocephalic, atraumatic. The mucous membranes are moist. The superficial temporal arteries are without ropiness or tenderness. Cardiovascular: Regular rate and rhythm. Lungs: Clear to auscultation bilaterally. Neck: There are no carotid bruits noted bilaterally.  NEUROLOGICAL:  Orientation:  The patient is alert and oriented x 3.   Cranial nerves: There is good facial symmetry. Extraocular muscles are intact and visual fields are full to confrontational testing. Speech is fluent and clear. Soft palate rises symmetrically and there is no tongue deviation. Hearing is intact to conversational tone. Tone: Tone is good throughout. Sensation: Sensation is intact to light touch touch throughout (facial, trunk, extremities). Vibration is intact at the bilateral big toe but mildly decreased distally. There is no extinction with double simultaneous stimulation. There is no sensory dermatomal level identified. Coordination:  The patient has no dysdiadichokinesia or dysmetria. Motor: Strength is 5/5 in the bilateral upper and lower extremities.  Shoulder shrug is equal bilaterally.  There is no pronator drift.  There are no fasciculations  noted. DTR's: Deep tendon reflexes are 0-1/4 at the bilateral biceps, triceps, brachioradialis, patella and achilles.  Plantar responses are downgoing bilaterally. Gait and Station: The patient is able to ambulate without difficulty. The patient is able to heel toe walk without any difficulty.   MOVEMENT EXAM: Tremor:  There is mild postural tremor.  There is mild to mod intention tremor on the R and mild on the L.   There is tremor with  Archimedes spirals on the R.  There is no tremor at rest.  The patient has tremor when pouring water from 1 glass to another, particularly when the full glass is in the right hand.      I have reviewed and interpreted the following labs independently   Chemistry      Component Value Date/Time   NA 137 03/07/2023 1607   K 4.3 03/07/2023 1607   CL 102 03/07/2023 1607  CO2 20 03/07/2023 1607   BUN 21 03/07/2023 1607   CREATININE 1.37 (H) 03/07/2023 1607   CREATININE 0.79 11/07/2015 0831      Component Value Date/Time   CALCIUM 9.1 03/07/2023 1607   ALKPHOS 47 12/29/2022 1350   AST 25 12/29/2022 1350   ALT 28 12/29/2022 1350   BILITOT 0.8 12/29/2022 1350   BILITOT 0.4 08/03/2022 1040      Lab Results  Component Value Date   WBC 9.0 03/07/2023   HGB 15.7 03/07/2023   HCT 46.8 03/07/2023   MCV 94 03/07/2023   PLT 178 03/07/2023   Lab Results  Component Value Date   TSH 1.159 12/29/2022      Total time spent on today's visit was 60 minutes, including both face-to-face time and nonface-to-face time.  Time included that spent on review of records (prior notes available to me/labs/imaging if pertinent), discussing treatment and goals, answering patient's questions and coordinating care.  CC:  Georganna Skeans, MD

## 2023-04-14 ENCOUNTER — Ambulatory Visit: Payer: 59 | Admitting: Neurology

## 2023-04-14 ENCOUNTER — Encounter: Payer: Self-pay | Admitting: Neurology

## 2023-04-14 VITALS — BP 118/82 | HR 71 | Ht 72.0 in

## 2023-04-14 DIAGNOSIS — T887XXA Unspecified adverse effect of drug or medicament, initial encounter: Secondary | ICD-10-CM | POA: Diagnosis not present

## 2023-04-14 DIAGNOSIS — G250 Essential tremor: Secondary | ICD-10-CM | POA: Diagnosis not present

## 2023-04-14 NOTE — Patient Instructions (Signed)
We discussed that the amiodarone may make your tremor worse.  We discussed medication like primidone but it interacts with the eliquis.  We will discuss this further after your meeting with cardiology.  The physicians and staff at [facility] are committed to providing excellent care. You may receive a survey requesting feedback about your experience at our office. We strive to receive "very good" responses to the survey questions. If you feel that your experience would prevent you from giving the office a "very good " response, please contact our office to try to remedy the situation. We may be reached at [phone]. Thank you for taking the time out of your busy day to complete the survey.

## 2023-04-28 ENCOUNTER — Ambulatory Visit (HOSPITAL_COMMUNITY)
Admission: RE | Admit: 2023-04-28 | Discharge: 2023-04-28 | Disposition: A | Discharge status: Home / Self Care | Payer: 59 | Source: Ambulatory Visit | Attending: Physician Assistant | Admitting: Physician Assistant

## 2023-04-28 ENCOUNTER — Encounter (HOSPITAL_COMMUNITY): Payer: Self-pay | Admitting: Physician Assistant

## 2023-04-28 VITALS — BP 106/76 | HR 70 | Ht 72.0 in | Wt 239.2 lb

## 2023-04-28 DIAGNOSIS — D6869 Other thrombophilia: Secondary | ICD-10-CM | POA: Insufficient documentation

## 2023-04-28 DIAGNOSIS — I10 Essential (primary) hypertension: Secondary | ICD-10-CM | POA: Diagnosis not present

## 2023-04-28 DIAGNOSIS — Z79899 Other long term (current) drug therapy: Secondary | ICD-10-CM | POA: Diagnosis not present

## 2023-04-28 DIAGNOSIS — Z7901 Long term (current) use of anticoagulants: Secondary | ICD-10-CM | POA: Diagnosis not present

## 2023-04-28 DIAGNOSIS — I2510 Atherosclerotic heart disease of native coronary artery without angina pectoris: Secondary | ICD-10-CM | POA: Insufficient documentation

## 2023-04-28 DIAGNOSIS — I4819 Other persistent atrial fibrillation: Secondary | ICD-10-CM | POA: Diagnosis present

## 2023-04-28 DIAGNOSIS — Z951 Presence of aortocoronary bypass graft: Secondary | ICD-10-CM | POA: Insufficient documentation

## 2023-04-28 DIAGNOSIS — E785 Hyperlipidemia, unspecified: Secondary | ICD-10-CM | POA: Insufficient documentation

## 2023-04-28 NOTE — Progress Notes (Signed)
 Primary Care Physician: Tanda Bleacher, MD Primary Cardiologist: Dr Jeffrie (new) Primary Electrophysiologist: Dr Nancey  Referring Physician: Dr [name] is a 58 y.o. male with a history of CAD s/p CABG 2017, tobacco abuse, HTN, HLD, atrial fibrillation who presents for follow up in the [facility].  The patient was initially diagnosed with atrial fibrillation in 2017 postoperatively following his CABG. He was seen by Dr Jeffery 02/17/22 and was found to be back in afib at that visit. He was started on Eliquis  for a CHADS2VASC score of 2. His amlodipine  was also decreased due to low BP and dizziness. He denies significant alcohol use, snores intermittently. Patient is s/p DCCV on 03/23/22.   Patient underwent L cataract surgery on 09/03/22 and was found to be back in rate controlled afib. He underwent DCCV 09/21/22 but was back in afib at his follow up on 10/29/22. He was loaded on amiodarone  as a bridge to ablation and had repeat DCCV on 11/15/22 but presented in SR.   Patient returns for follow up for atrial fibrillation. Patient is s/p afib ablation with Dr Nancey on 03/31/23. He reports that he has done very well since the ablation with no interim symptoms of afib. He denies chest pain or groin issues. No bleeding issues on anticoagulation.   Today, he denies symptoms of palpitations, chest pain, shortness of breath, orthopnea, PND, lower extremity edema, dizziness, presyncope, syncope, snoring, daytime somnolence, bleeding, or neurologic sequela. The patient is tolerating medications without difficulties and is otherwise without complaint today.    Atrial Fibrillation Risk Factors:  he does have symptoms or diagnosis of sleep apnea. he does not have a history of rheumatic fever. he does not have a history of alcohol use. The patient does not have a history of early familial atrial fibrillation or other arrhythmias.   Atrial Fibrillation  Management history:  Previous antiarrhythmic drugs: amiodarone   Previous cardioversions: 03/23/22 Previous ablations: 03/31/23 Anticoagulation history: Eliquis    Past Medical History:  Diagnosis Date   Anxiety    Depression    Hyperlipidemia    Hypertension    Lumbar herniated disc dx'd 03/2015   NSTEMI (non-ST elevated myocardial infarction) (HCC) 04/15/2015    ROS- All systems are reviewed and negative except as per the HPI above.  Physical Exam: Vitals:   04/28/23 1315  BP: 106/76  Pulse: 70  Weight: 108.5 kg  Height: 6' (1.829 m)     GEN: Well nourished, well developed in no acute distress CARDIAC: Regular rate and rhythm, no murmurs, rubs, gallops RESPIRATORY:  Clear to auscultation without rales, wheezing or rhonchi  ABDOMEN: Soft, non-tender, non-distended EXTREMITIES:  No edema; No deformity    Wt Readings from Last 3 Encounters:  04/28/23 108.5 kg  04/05/23 106.6 kg  03/31/23 107.5 kg    EKG today demonstrates  SR Vent. rate 70 BPM PR interval 154 ms QRS duration 100 ms QT/QTcB 420/453 ms   Echo 03/08/22 demonstrated   1. Left ventricular ejection fraction, by estimation, is 60 to 65%. The  left ventricle has normal function. The left ventricle has no regional  wall motion abnormalities. Left ventricular diastolic parameters are  indeterminate.   2. Right ventricular systolic function is normal. The right ventricular  size is normal.   3. Left atrial size was mildly dilated.   4. Right atrial size was mildly dilated.   5. The mitral valve is normal in structure. Mild mitral valve  regurgitation.  6. The aortic valve is tricuspid. Aortic valve regurgitation is not  visualized. Aortic valve sclerosis/calcification is present, without any  evidence of aortic stenosis.   7. The inferior vena cava is dilated in size with <50% respiratory  variability, suggesting right atrial pressure of 15 mmHg.   Epic records are reviewed at length  today  CHA2DS2-VASc Score = 2  The patient's score is based upon: CHF History: 0 HTN History: 1 Diabetes History: 0 Stroke History: 0 Vascular Disease History: 1 Age Score: 0 Gender Score: 0       ASSESSMENT AND PLAN: Persistent Atrial Fibrillation (ICD10:  I48.19) The patient's CHA2DS2-VASc score is 2, indicating a 2.2% annual risk of stroke.   S/p afib ablation 03/31/23 Patient will discontinue amiodarone  at one month post ablation (05/01/23) Continue Eliquis  5 mg BID with no missed doses for 3 months post ablation.  Continue Toprol  50 mg daily  Secondary Hypercoagulable State (ICD10:  D68.69) The patient is at significant risk for stroke/thromboembolism based upon his CHA2DS2-VASc Score of 2.  Continue Apixaban  (Eliquis ).   CAD S/p CABG 2017 No anginal symptoms Has appointment to establish care with Dr Jeffrie.  HTN Stable on current regimen   Follow up Daphne Barrack as scheduled.    Jeffery Kicks PA-C [facility] [address] [place], [place] [address] [phone] 04/28/2023 1:32 PM

## 2023-05-20 ENCOUNTER — Encounter: Payer: Self-pay | Admitting: Cardiology

## 2023-05-20 ENCOUNTER — Ambulatory Visit: Payer: 59 | Attending: Cardiology | Admitting: Cardiology

## 2023-05-20 VITALS — BP 120/82 | HR 66 | Ht 72.0 in | Wt 239.0 lb

## 2023-05-20 DIAGNOSIS — D6869 Other thrombophilia: Secondary | ICD-10-CM

## 2023-05-20 DIAGNOSIS — I2510 Atherosclerotic heart disease of native coronary artery without angina pectoris: Secondary | ICD-10-CM | POA: Diagnosis not present

## 2023-05-20 DIAGNOSIS — I4819 Other persistent atrial fibrillation: Secondary | ICD-10-CM | POA: Diagnosis not present

## 2023-05-20 NOTE — Patient Instructions (Signed)
 Medication Instructions:  The current medical regimen is effective;  continue present plan and medications.  *If you need a refill on your cardiac medications before your next appointment, please call your pharmacy*  Follow-Up: At [facility], you and your health needs are our priority.  As part of our continuing mission to provide you with exceptional heart care, we have created designated Provider Care Teams.  These Care Teams include your primary Cardiologist (physician) and Advanced Practice Providers (APPs -  Physician Assistants and Nurse Practitioners) who all work together to provide you with the care you need, when you need it.  We recommend signing up for the patient portal called "MyChart".  Sign up information is provided on this After Visit Summary.  MyChart is used to connect with patients for Virtual Visits (Telemedicine).  Patients are able to view lab/test results, encounter notes, upcoming appointments, etc.  Non-urgent messages can be sent to your provider as well.   To learn more about what you can do with MyChart, go to [email or web address].    Your next appointment:   1 year(s)  Provider:   Donato Schultz, MD           1st Floor: - Lobby - Registration  - Pharmacy  - Lab - Cafe  2nd Floor: - PV Lab - Diagnostic Testing (echo, CT, nuclear med)  3rd Floor: - Vacant  4th Floor: - TCTS (cardiothoracic surgery) - AFib Clinic - Structural Heart Clinic - Vascular Surgery  - Vascular Ultrasound  5th Floor: - HeartCare Cardiology (general and EP) - Clinical Pharmacy for coumadin, hypertension, lipid, weight-loss medications, and med management appointments    Valet parking services will be available as well.

## 2023-05-20 NOTE — Progress Notes (Signed)
 Cardiology Office Note:  .   Date:  05/20/2023  ID:  [name], DOB [birth year], MRN [id number] PCP: Georganna Skeans, MD  [facility] Providers Cardiologist:  Donato Schultz, MD Electrophysiologist:  [name], MD     History of Present Illness: .   [name] is a 58 y.o. male Discussed the use of AI scribe software for clinical note transcription with the patient, who gave verbal consent to proceed.  History of Present Illness [name] is a 58 year old male with coronary artery disease and atrial fibrillation who presents for follow-up.  He has a history of coronary artery disease, having undergone coronary artery bypass grafting (CABG) in 2017 following a myocardial infarction. One month post-surgery, he experienced another myocardial infarction due to a collapsed graft, which was managed medically. He is currently on atorvastatin 40 mg, Zetia 10 mg, fenofibrate 145 mg, and Vascepa 2 grams twice a day for hyperlipidemia. His LDL was 63 mg/dL and triglycerides were 67 mg/dL as of Aug 03, 2022.  He has a history of atrial fibrillation, first noted on February 17, 2022, when he was started on Eliquis for a CHADS-VASc score of 2. He underwent cardioversion on March 23, 2022, and September 21, 2022, but reverted to atrial fibrillation by October 29, 2022. He was loaded on amiodarone as a bridge to ablation and had repeat cardioversion on November 15, 2022. He underwent atrial fibrillation ablation on March 31, 2023, and reports feeling well since. He continues on Eliquis 5 mg twice a day and Toprol 50 mg daily. He has discontinued amiodarone post-ablation.  He has a history of hypertension, managed with lisinopril 40 mg daily and metoprolol 50 mg daily. He also takes spironolactone 25 mg daily for heart health.  He is a smoker and has attempted to quit in the past, notably after his bypass surgery, but resumed smoking after four months. He acknowledges the difficulty in  quitting and the persistent cravings.  He has a history of prediabetes and has been on Wegovy, which initially helped him lose 80 pounds, though he has regained 40 pounds. His hemoglobin A1c is 5.4, indicating no current diabetes.  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    Studies Reviewed: .        Results LABS LDL: 63 (08/03/2022) Triglycerides: 67 (08/03/2022) HbA1c: 5.4 Hb: 15.1 Cr: 1.37 ALT: 28 TSH: 1.1  DIAGNOSTIC Echocardiogram: EF 60-65%, mild left atrial enlargement (03/08/2022) Risk Assessment/Calculations:            Physical Exam:   VS:  BP 120/82   Pulse 66   Ht 6' (1.829 m)   Wt 239 lb (108.4 kg)   SpO2 96%   BMI 32.41 kg/m    Wt Readings from Last 3 Encounters:  05/20/23 239 lb (108.4 kg)  04/28/23 239 lb 3.2 oz (108.5 kg)  04/05/23 235 lb (106.6 kg)    GEN: Well nourished, well developed in no acute distress NECK: No JVD; No carotid bruits CARDIAC: RRR, no murmurs, no rubs, no gallops RESPIRATORY:  Clear to auscultation without rales, wheezing or rhonchi  ABDOMEN: Soft, non-tender, non-distended EXTREMITIES:  No edema; No deformity   ASSESSMENT AND PLAN: .    Assessment and Plan Assessment & Plan Atrial Fibrillation Parox atrial fibrillation with multiple cardioversions (including one from me) and recent ablation on 03/31/2023, Dr. Nelly Laurence. Currently on Eliquis 5  mg BID for anticoagulation. No longer on amiodarone post-ablation. Asymptomatic. Discussed the importance of continuing Eliquis to prevent strokes in case of recurrence. Patient prefers to avoid further invasive procedures unless absolutely necessary. - Continue Eliquis 5 mg BID - Follow up with EP staff on 06/29/2023  Coronary Artery Disease (CAD) CABG in 2017 with subsequent myocardial infarction due to graft collapse. Managed with atorvastatin, Zetia, fenofibrate, and Vascepa. LDL  and triglycerides are well controlled. Asymptomatic. Discussed the importance of medication adherence to prevent future cardiac events. - Continue atorvastatin 40 mg daily - Continue Zetia 10 mg daily - Continue fenofibrate 145 mg daily - Continue Vascepa 2 g BID - Continue isosorbide mononitrate 90 mg daily  Hypertension Chronic hypertension managed with lisinopril and metoprolol. Blood pressure control appears adequate. Discussed the importance of maintaining blood pressure control to prevent further cardiovascular complications. - Continue lisinopril 40 mg daily - Continue metoprolol 50 mg daily  Hyperlipidemia Well-controlled hyperlipidemia with LDL at 63 mg/dL and triglycerides at 67 mg/dL. Managed with atorvastatin, Zetia, fenofibrate, and Vascepa. Discussed the goal of maintaining LDL below 70 mg/dL, ideally below 55 mg/dL. - Continue current lipid-lowering regimen  Chronic Kidney Disease Stage 3A Mild chronic kidney disease with creatinine at 1.37 mg/dL. Advised to avoid NSAIDs to prevent further kidney damage. - Avoid NSAIDs  General Health Maintenance Patient is a smoker and has been advised on the importance of smoking cessation. Weight management discussed with a history of significant weight loss on Wegovy. Encouraged to maintain weight loss to prevent prediabetes and other complications. - Encourage smoking cessation - Continue Wegovy (lost 80 then gained back 40) for weight management  Follow-up - Schedule follow-up with me in one year - Reach out if any changes or concerns arise.          Signed, Donato Schultz, MD

## 2023-06-16 ENCOUNTER — Ambulatory Visit: Payer: 59 | Admitting: Neurology

## 2023-06-27 NOTE — Progress Notes (Unsigned)
  Electrophysiology Office Note:   Date:  06/29/2023  ID:  [name], DOB May 16, 1965, MRN [id number]  Primary Cardiologist: Donato Schultz, MD Primary Heart Failure: None Electrophysiologist: [name], MD      History of Present Illness:   [name] is a 58 y.o. male with h/o AF, HTN, HLD, CAD s/p CABG, tobacco abuse seen today for routine electrophysiology followup.   Since last being seen in our clinic the patient reports he has been doing very well. No issues with AF since ablation. No bleeding on Eliquis. He continues to smoke. He reports he quit smoking after his bypass surgery x4 months but went back to it as his "whole family smokes".    He denies chest pain, palpitations, dyspnea, PND, orthopnea, nausea, vomiting, dizziness, syncope, edema, weight gain, or early satiety.   Review of systems complete and found to be negative unless listed in HPI.   EP Information / Studies Reviewed:    EKG is ordered today. Personal review as below.  EKG Interpretation Date/Time:  Wednesday June 29 2023 13:15:06 EDT Ventricular Rate:  60 PR Interval:  158 QRS Duration:  100 QT Interval:  432 QTC Calculation: 432 R Axis:   -6  Text Interpretation: Normal sinus rhythm with sinus arrhythmia Confirmed by Canary Brim ([id number]) on 06/29/2023 1:24:10 PM   Studies:  ECHO 02/2022 > LVEF 60-65%, no RWMA, LA / RA mildly dilated  EPS 03/31/23 >  SR on presentation, successful ablation of all four pulmonary veins with PFA, successful ablation of the posterior wall of the LA  Arrhythmia / AAD AF DCCV > 03/23/22, 09/21/22, 11/15/22 Amiodarone > stopped 04/2023 4 weeks post ablation    Risk Assessment/Calculations:    CHA2DS2-VASc Score = 2   This indicates a 2.2% annual risk of stroke. The patient's score is based upon: CHF History: 0 HTN History: 1 Diabetes History: 0 Stroke History: 0 Vascular Disease History: 1 Age Score: 0 Gender Score: 0             Physical Exam:    VS:  BP 124/88   Pulse 60   Ht 6' (1.829 m)   Wt 244 lb 3.2 oz (110.8 kg)   SpO2 98%   BMI 33.12 kg/m    Wt Readings from Last 3 Encounters:  06/29/23 244 lb 3.2 oz (110.8 kg)  05/20/23 239 lb (108.4 kg)  04/28/23 239 lb 3.2 oz (108.5 kg)     GEN: Well nourished, well developed in no acute distress NECK: No JVD; No carotid bruits CARDIAC: Regular rate and rhythm, no murmurs, rubs, gallops RESPIRATORY:  Clear to auscultation without rales, wheezing or rhonchi, raspy voice / frequent raspy cough  ABDOMEN: Soft, non-tender, non-distended EXTREMITIES:  No edema; No deformity   ASSESSMENT AND PLAN:    Persistent Atrial Fibrillation  CHA2DS2-VASc 2, s/p ablation 03/2023  -continue OAC for stroke prophylaxis  -Toprol 50 mg daily  -stopped amiodarone 4 weeks post ablation  -no symptom burden since ablation   -encouraged patient to consider Kardia Mobile for monitoring   Secondary Hypercoagulable State  -continue Eliquis 5mg  BID, dose reviewed and appropriate by age / wt   CAD s/p CABG  HLD -no anginal symptoms -statin per primary Cardiology   Follow up with EP APP in 6 months, then 1 year post ablation   Signed, Canary Brim, NP-C, AGACNP-BC [facility] - Electrophysiology  06/29/2023, 4:32 PM

## 2023-06-29 ENCOUNTER — Ambulatory Visit: Payer: 59 | Attending: Pulmonary Disease | Admitting: Pulmonary Disease

## 2023-06-29 ENCOUNTER — Encounter: Payer: Self-pay | Admitting: Pulmonary Disease

## 2023-06-29 VITALS — BP 124/88 | HR 60 | Ht 72.0 in | Wt 244.2 lb

## 2023-06-29 DIAGNOSIS — I2510 Atherosclerotic heart disease of native coronary artery without angina pectoris: Secondary | ICD-10-CM | POA: Diagnosis not present

## 2023-06-29 DIAGNOSIS — D6869 Other thrombophilia: Secondary | ICD-10-CM

## 2023-06-29 DIAGNOSIS — I4819 Other persistent atrial fibrillation: Secondary | ICD-10-CM | POA: Diagnosis not present

## 2023-06-29 NOTE — Patient Instructions (Addendum)
 Medication Instructions:  Your physician recommends that you continue on your current medications as directed. Please refer to the Current Medication list given to you today.  *If you need a refill on your cardiac medications before your next appointment, please call your pharmacy*  Lab Work: None ordered If you have labs (blood work) drawn today and your tests are completely normal, you will receive your results only by: MyChart Message (if you have MyChart) OR A paper copy in the mail If you have any lab test that is abnormal or we need to change your treatment, we will call you to review the results.  Follow-Up: At [facility], you and your health needs are our priority.  As part of our continuing mission to provide you with exceptional heart care, our providers are all part of one team.  This team includes your primary Cardiologist (physician) and Advanced Practice Providers or APPs (Physician Assistants and Nurse Practitioners) who all work together to provide you with the care you need, when you need it.  Your next appointment:   6 month(s)  Provider:   York Pellant, MD    Other Instructions Consider reducing the amount you smoke daily as it will reduce your AFib risk.   Continue your blood thinner medication (Eliquis) as this protects you against ischemic stroke.         1st Floor: - Lobby - Registration  - Pharmacy  - Lab - Cafe  2nd Floor: - PV Lab - Diagnostic Testing (echo, CT, nuclear med)  3rd Floor: - Vacant  4th Floor: - TCTS (cardiothoracic surgery) - AFib Clinic - Structural Heart Clinic - Vascular Surgery  - Vascular Ultrasound  5th Floor: - HeartCare Cardiology (general and EP) - Clinical Pharmacy for coumadin, hypertension, lipid, weight-loss medications, and med management appointments    Valet parking services will be available as well.

## 2023-07-02 ENCOUNTER — Other Ambulatory Visit: Payer: Self-pay | Admitting: Family

## 2023-07-02 DIAGNOSIS — K219 Gastro-esophageal reflux disease without esophagitis: Secondary | ICD-10-CM

## 2023-07-04 NOTE — Telephone Encounter (Signed)
 Requested Prescriptions  Pending Prescriptions Disp Refills   pantoprazole (PROTONIX) 40 MG tablet [Pharmacy Med Name: PANTOPRAZOLE SOD DR 40 MG TAB] 30 tablet 0    Sig: TAKE 1 TABLET BY MOUTH EVERY DAY     Gastroenterology: Proton Pump Inhibitors Passed - 07/04/2023 12:00 PM      Passed - Valid encounter within last 12 months    Recent Outpatient Visits           3 months ago Anxiety and depression   [facility] [facility] [place], NP   11 months ago Annual physical exam   [place] [facility], MD   1 year ago Gastroesophageal reflux disease without esophagitis   [place] [facility], MD   1 year ago Anxiety and depression   [place] [facility], MD   2 years ago Essential hypertension   [place] [facility], MD

## 2023-07-19 ENCOUNTER — Other Ambulatory Visit: Payer: Self-pay | Admitting: Cardiology

## 2023-08-06 ENCOUNTER — Encounter: Payer: Self-pay | Admitting: Cardiovascular Disease

## 2023-08-06 ENCOUNTER — Other Ambulatory Visit: Payer: Self-pay | Admitting: Family

## 2023-08-06 ENCOUNTER — Other Ambulatory Visit: Payer: Self-pay | Admitting: Cardiology

## 2023-08-06 DIAGNOSIS — Z79899 Other long term (current) drug therapy: Secondary | ICD-10-CM

## 2023-08-06 DIAGNOSIS — F419 Anxiety disorder, unspecified: Secondary | ICD-10-CM

## 2023-08-06 DIAGNOSIS — I2510 Atherosclerotic heart disease of native coronary artery without angina pectoris: Secondary | ICD-10-CM

## 2023-08-06 DIAGNOSIS — E782 Mixed hyperlipidemia: Secondary | ICD-10-CM

## 2023-08-06 DIAGNOSIS — E785 Hyperlipidemia, unspecified: Secondary | ICD-10-CM

## 2023-08-06 DIAGNOSIS — I10 Essential (primary) hypertension: Secondary | ICD-10-CM

## 2023-08-06 DIAGNOSIS — Z951 Presence of aortocoronary bypass graft: Secondary | ICD-10-CM

## 2023-08-10 ENCOUNTER — Telehealth: Payer: Self-pay | Admitting: Pharmacy Technician

## 2023-08-10 ENCOUNTER — Other Ambulatory Visit: Payer: Self-pay | Admitting: Family Medicine

## 2023-08-10 DIAGNOSIS — F419 Anxiety disorder, unspecified: Secondary | ICD-10-CM

## 2023-08-10 DIAGNOSIS — F32A Depression, unspecified: Secondary | ICD-10-CM

## 2023-08-10 NOTE — Telephone Encounter (Signed)
 Pharmacy Patient Advocate Encounter   Received notification from CoverMyMeds that prior authorization for Icosapent  Ethyl 1GM capsules  is required/requested.   Insurance verification completed.   The patient is insured through [facility] .   Per test claim: PA required; PA submitted to above mentioned insurance via CoverMyMeds Key/confirmation #/EOC [id number] Status is pending

## 2023-08-10 NOTE — Telephone Encounter (Signed)
 Copied from CRM [id number]. Topic: Clinical - Medication Refill >> Aug 10, 2023  3:41 PM Rosaria Common wrote: Medication: FLUoxetine  (PROZAC ) 10 MG tablet  Has the patient contacted their pharmacy? Yes (Agent: If no, request that the patient contact the pharmacy for the refill. If patient does not wish to contact the pharmacy document the reason why and proceed with request.) (Agent: If yes, when and what did the pharmacy advise?)  This is the patient's preferred pharmacy:  [id number] - [place], [place] - [address] AT [address]NER OF [address] [place] [address] Phone: [phone] Fax: [phone]    Is this the correct pharmacy for this prescription? Yes If no, delete pharmacy and type the correct one.   Has the prescription been filled recently? No  Is the patient out of the medication? No  Has the patient been seen for an appointment in the last year OR does the patient have an upcoming appointment? Yes  Can we respond through MyChart? Yes  Agent: Please be advised that Rx refills may take up to 3 business days. We ask that you follow-up with your pharmacy.

## 2023-08-10 NOTE — Telephone Encounter (Signed)
 Copied from CRM [id number]. Topic: Clinical - Medication Refill >> Aug 10, 2023  3:27 PM Oddis Bench wrote: Medication: finasteride (PROSCAR) 5 MG tablet  Has the patient contacted their pharmacy? Yes Had to cotact PCP first  This is the patient's preferred pharmacy:  [id number] - [place], [place] - [address] AT [address]NER OF [facility] [address] [place] [address] Phone: [phone] Fax: [phone]   Is this the correct pharmacy for this prescription? Yes If no, delete pharmacy and type the correct one.   Has the prescription been filled recently? Yes  Is the patient out of the medication? Yes  Has the patient been seen for an appointment in the last year OR does the patient have an upcoming appointment? Yes  Can we respond through MyChart? Yes  Agent: Please be advised that Rx refills may take up to 3 business days. We ask that you follow-up with your pharmacy.

## 2023-08-10 NOTE — Telephone Encounter (Signed)
 Pharmacy Patient Advocate Encounter  Received notification from [employer] that Prior Authorization for Icosapent  1gm has been APPROVED from 08/10/23 to 03/21/2038. Spoke to pharmacy to process.Copay is $15.00 for 90 days.    PA #/Case ID/Reference #: [id number]

## 2023-08-12 MED ORDER — FLUOXETINE HCL 10 MG PO TABS: 30.0000 mg | ORAL_TABLET | Freq: Every day | ORAL | 0 refills | Status: DC

## 2023-08-12 NOTE — Telephone Encounter (Signed)
 Requested medication (s) are due for refill today: yes  Requested medication (s) are on the active medication list: yes  Last refill:  05/25/22 #90 3 RF  Future visit scheduled: yes  Notes to clinic:  prescriber not at this practice   Requested Prescriptions  Pending Prescriptions Disp Refills   finasteride (PROSCAR) 5 MG tablet 90 tablet 3    Sig: Take 1 tablet (5 mg total) by mouth daily.     Urology: 5-alpha Reductase Inhibitors Failed - 08/12/2023  8:07 AM      Failed - PSA in normal range and within 360 days    No results found for: "LABPSA", "PSA", "PSA1", "ULTRAPSA"       Passed - Valid encounter within last 12 months    Recent Outpatient Visits           4 months ago Anxiety and depression   [place] [facility], [place], [place]   1 year ago Annual physical exam   [place] [facility], MD   1 year ago Gastroesophageal reflux disease without esophagitis   [place] [facility], MD   2 years ago Anxiety and depression   [place] [facility], MD   2 years ago Essential hypertension   [place] [facility], MD              Signed Prescriptions Disp Refills   FLUoxetine  (PROZAC ) 10 MG tablet 270 tablet 0    Sig: Take 3 tablets (30 mg total) by mouth daily.     Psychiatry:  Antidepressants - SSRI Passed - 08/12/2023  8:07 AM      Passed - Valid encounter within last 6 months    Recent Outpatient Visits           4 months ago Anxiety and depression   [place] [facility] [place], [place], [place]   1 year ago Annual physical exam   [place] [facility], MD   1 year ago Gastroesophageal reflux disease without esophagitis   [place] [facility], MD   2 years ago Anxiety and depression   [place] [facility], MD   2 years ago Essential hypertension   [place] [facility], MD

## 2023-08-12 NOTE — Telephone Encounter (Signed)
 Requested medication (s) are due for refill today: yes to both  Requested medication (s) are on the active medication list: yes  Last refill:  finasteride: 05/25/22 #90 3 RF              Fluoxetine : 04/05/23 #270  Future visit scheduled: yes  Notes to clinic:  finasteride:prescriber not at this practice and need approval for refill- has appt   Requested Prescriptions  Pending Prescriptions Disp Refills   finasteride (PROSCAR) 5 MG tablet 90 tablet 3    Sig: Take 1 tablet (5 mg total) by mouth daily.     Urology: 5-alpha Reductase Inhibitors Failed - 08/12/2023  8:04 AM      Failed - PSA in normal range and within 360 days    No results found for: "LABPSA", "PSA", "PSA1", "ULTRAPSA"       Passed - Valid encounter within last 12 months    Recent Outpatient Visits           4 months ago Anxiety and depression   [place] [facility], [place], [place]   1 year ago Annual physical exam   [place] [facility], MD   1 year ago Gastroesophageal reflux disease without esophagitis   [place] [facility], MD   2 years ago Anxiety and depression   [place] [facility], MD   2 years ago Essential hypertension   [place] [facility], [place] Caffey, MD               FLUoxetine  (PROZAC ) 10 MG tablet 270 tablet 0    Sig: Take 3 tablets (30 mg total) by mouth daily.     Psychiatry:  Antidepressants - SSRI Passed - 08/12/2023  8:04 AM      Passed - Valid encounter within last 6 months    Recent Outpatient Visits           4 months ago Anxiety and depression   [place] [facility], [place], [place]   1 year ago Annual physical exam   [place] [facility], MD   1 year ago Gastroesophageal reflux disease without esophagitis   [place] [facility], MD   2 years ago Anxiety and depression   [place] [facility] at [facility], MD   2 years ago Essential hypertension   [place] [facility], MD

## 2023-08-12 NOTE — Telephone Encounter (Signed)
 Requested medications are due for refill today.  yes  Requested medications are on the active medications list.  yes  Last refill. 05/25/2022 #90 3 rf  Future visit scheduled.   yes  Notes to clinic.  Missing labs.    Requested Prescriptions  Pending Prescriptions Disp Refills   finasteride (PROSCAR) 5 MG tablet 90 tablet 3    Sig: Take 1 tablet (5 mg total) by mouth daily.     Urology: 5-alpha Reductase Inhibitors Failed - 08/12/2023  8:06 AM      Failed - PSA in normal range and within 360 days    No results found for: "LABPSA", "PSA", "PSA1", "ULTRAPSA"       Passed - Valid encounter within last 12 months    Recent Outpatient Visits           4 months ago Anxiety and depression   [place] [facility], [place]   1 year ago Annual physical exam   [place] [facility], MD   1 year ago Gastroesophageal reflux disease without esophagitis   [place] [facility], MD   2 years ago Anxiety and depression   [place] [facility], MD   2 years ago Essential hypertension   [place] [facility], MD              Signed Prescriptions Disp Refills   FLUoxetine  (PROZAC ) 10 MG tablet 270 tablet 0    Sig: Take 3 tablets (30 mg total) by mouth daily.     Psychiatry:  Antidepressants - SSRI Passed - 08/12/2023  8:06 AM      Passed - Valid encounter within last 6 months    Recent Outpatient Visits           4 months ago Anxiety and depression   [place] [facility], [place], [place]   1 year ago Annual physical exam   [place] [facility], MD   1 year ago Gastroesophageal reflux disease without esophagitis   [place] [facility] at [facility], MD   2 years ago Anxiety and depression   [place] [facility] at [facility], MD   2 years ago Essential hypertension    [facility] at [facility], MD

## 2023-08-12 NOTE — Telephone Encounter (Signed)
 Requested Prescriptions  Pending Prescriptions Disp Refills   finasteride (PROSCAR) 5 MG tablet 90 tablet 3    Sig: Take 1 tablet (5 mg total) by mouth daily.     Urology: 5-alpha Reductase Inhibitors Failed - 08/12/2023  8:06 AM      Failed - PSA in normal range and within 360 days    No results found for: "LABPSA", "PSA", "PSA1", "ULTRAPSA"       Passed - Valid encounter within last 12 months    Recent Outpatient Visits           4 months ago Anxiety and depression   [place] [facility], [place], [place]   1 year ago Annual physical exam   [place] [facility], MD   1 year ago Gastroesophageal reflux disease without esophagitis   [place] [facility] at [facility], MD   2 years ago Anxiety and depression   [place] [facility], MD   2 years ago Essential hypertension   [place] [facility] at [facility] Ray Caffey, MD               FLUoxetine  (PROZAC ) 10 MG tablet 270 tablet 0    Sig: Take 3 tablets (30 mg total) by mouth daily.     Psychiatry:  Antidepressants - SSRI Passed - 08/12/2023  8:06 AM      Passed - Valid encounter within last 6 months    Recent Outpatient Visits           4 months ago Anxiety and depression   [place] [facility], [place], [place]   1 year ago Annual physical exam   [place] [facility], MD   1 year ago Gastroesophageal reflux disease without esophagitis   [place] [facility] at [facility], MD   2 years ago Anxiety and depression   [place] [facility] at [facility], MD   2 years ago Essential hypertension   [place] [facility], MD

## 2023-08-16 ENCOUNTER — Encounter: Payer: Self-pay | Admitting: Cardiology

## 2023-08-16 ENCOUNTER — Encounter: Payer: Self-pay | Admitting: *Deleted

## 2023-09-03 ENCOUNTER — Other Ambulatory Visit: Payer: Self-pay | Admitting: Cardiovascular Disease

## 2023-09-03 DIAGNOSIS — I4891 Unspecified atrial fibrillation: Secondary | ICD-10-CM

## 2023-09-03 DIAGNOSIS — Z951 Presence of aortocoronary bypass graft: Secondary | ICD-10-CM

## 2023-09-03 DIAGNOSIS — Z720 Tobacco use: Secondary | ICD-10-CM

## 2023-09-03 DIAGNOSIS — I2510 Atherosclerotic heart disease of native coronary artery without angina pectoris: Secondary | ICD-10-CM

## 2023-09-03 DIAGNOSIS — E785 Hyperlipidemia, unspecified: Secondary | ICD-10-CM

## 2023-09-03 DIAGNOSIS — I779 Disorder of arteries and arterioles, unspecified: Secondary | ICD-10-CM

## 2023-09-03 DIAGNOSIS — E782 Mixed hyperlipidemia: Secondary | ICD-10-CM

## 2023-09-03 DIAGNOSIS — I10 Essential (primary) hypertension: Secondary | ICD-10-CM

## 2023-09-03 DIAGNOSIS — Z79899 Other long term (current) drug therapy: Secondary | ICD-10-CM

## 2023-09-05 NOTE — Telephone Encounter (Signed)
 Prescription refill request for Eliquis  received. Indication:afib Last office visit:4/25 Scr:1.37  12/24 Age: 58 Weight:110.8  kg  Prescription refilled

## 2023-09-15 ENCOUNTER — Other Ambulatory Visit: Payer: Self-pay

## 2023-09-15 ENCOUNTER — Other Ambulatory Visit: Payer: Self-pay | Admitting: Family Medicine

## 2023-09-15 DIAGNOSIS — K219 Gastro-esophageal reflux disease without esophagitis: Secondary | ICD-10-CM

## 2023-09-15 DIAGNOSIS — F32A Depression, unspecified: Secondary | ICD-10-CM

## 2023-09-15 MED ORDER — ISOSORBIDE MONONITRATE ER 60 MG PO TB24: 90.0000 mg | ORAL_TABLET | Freq: Every day | ORAL | 2 refills | Status: AC

## 2023-09-21 ENCOUNTER — Ambulatory Visit: Admitting: Family Medicine

## 2023-09-21 ENCOUNTER — Encounter: Payer: Self-pay | Admitting: Family Medicine

## 2023-09-21 VITALS — BP 137/85 | HR 56 | Temp 98.1°F | Resp 16 | Ht 72.0 in | Wt 253.0 lb

## 2023-09-21 DIAGNOSIS — Z23 Encounter for immunization: Secondary | ICD-10-CM | POA: Diagnosis not present

## 2023-09-21 DIAGNOSIS — F418 Other specified anxiety disorders: Secondary | ICD-10-CM | POA: Diagnosis not present

## 2023-09-21 DIAGNOSIS — I10 Essential (primary) hypertension: Secondary | ICD-10-CM | POA: Diagnosis not present

## 2023-09-21 DIAGNOSIS — K219 Gastro-esophageal reflux disease without esophagitis: Secondary | ICD-10-CM | POA: Diagnosis not present

## 2023-09-21 DIAGNOSIS — F32A Depression, unspecified: Secondary | ICD-10-CM

## 2023-09-21 MED ORDER — FLUOXETINE HCL 10 MG PO TABS: 30.0000 mg | ORAL_TABLET | Freq: Every day | ORAL | 0 refills | Status: DC

## 2023-09-21 MED ORDER — BREXPIPRAZOLE 1 MG PO TABS: 1.0000 mg | ORAL_TABLET | Freq: Every day | ORAL | 1 refills | Status: AC

## 2023-09-21 MED ORDER — PANTOPRAZOLE SODIUM 40 MG PO TBEC
40.0000 mg | DELAYED_RELEASE_TABLET | Freq: Every day | ORAL | 0 refills | Status: DC
Start: 2023-09-21 — End: 2023-10-17

## 2023-09-21 NOTE — Progress Notes (Unsigned)
 Established Patient Office Visit  Subjective    Patient ID: [name], male    DOB: [birth year]  Age: 58 y.o. MRN: [id number]  CC:  Chief Complaint  Patient presents with   Medical Management of Chronic Issues    HPI [name] presents for routine follow up of chronic med issues including hypertension and anxiety/depression. Patient reports that he does not believe that his prozac  is working well enough.   Outpatient Encounter Medications as of 09/21/2023  Medication Sig   acetaminophen  (TYLENOL ) 500 MG tablet Take 500 mg by mouth every 6 (six) hours as needed for moderate pain.   atorvastatin  (LIPITOR ) 40 MG tablet TAKE 1 TABLET BY MOUTH EVERY DAY   brexpiprazole (REXULTI) 1 MG TABS tablet Take 1 tablet (1 mg total) by mouth daily.   ELIQUIS  5 MG TABS tablet TAKE 1 TABLET BY MOUTH TWICE A DAY   ezetimibe  (ZETIA ) 10 MG tablet TAKE 1 TABLET BY MOUTH EVERY DAY   fenofibrate  (TRICOR ) 145 MG tablet TAKE 1 TABLET BY MOUTH EVERY DAY   hydrochlorothiazide  (HYDRODIURIL ) 25 MG tablet TAKE 1 TABLET (25 MG TOTAL) BY MOUTH DAILY.   icosapent  Ethyl (VASCEPA ) 1 g capsule TAKE 2 CAPSULES BY MOUTH 2 TIMES DAILY.   isosorbide  mononitrate (IMDUR ) 60 MG 24 hr tablet Take 1.5 tablets (90 mg total) by mouth daily.   levocetirizine (XYZAL) 5 MG tablet Take 5 mg by mouth daily as needed for allergies.   lisinopril  (ZESTRIL ) 40 MG tablet TAKE 1 TABLET BY MOUTH EVERY DAY   metoprolol  succinate (TOPROL -XL) 50 MG 24 hr tablet TAKE 1 TABLET BY MOUTH DAILY. TAKE WITH OR IMMEDIATELY FOLLOWING A MEAL.   nitroGLYCERIN  (NITROSTAT ) 0.4 MG SL tablet Place 1 tablet (0.4 mg total) under the tongue every 5 (five) minutes x 3 doses as needed for chest pain.   spironolactone  (ALDACTONE ) 25 MG tablet TAKE 1 TABLET (25 MG TOTAL) BY MOUTH DAILY.   [DISCONTINUED] FLUoxetine  (PROZAC ) 10 MG tablet Take 3 tablets (30 mg total) by mouth daily.   [DISCONTINUED] pantoprazole  (PROTONIX ) 40 MG tablet TAKE 1 TABLET BY MOUTH  EVERY DAY   FLUoxetine  (PROZAC ) 10 MG tablet Take 3 tablets (30 mg total) by mouth daily.   pantoprazole  (PROTONIX ) 40 MG tablet Take 1 tablet (40 mg total) by mouth daily.   [DISCONTINUED] finasteride (PROSCAR) 5 MG tablet TAKE 1 TABLET BY MOUTH EVERY DAY   [DISCONTINUED] silodosin (RAPAFLO) 8 MG CAPS capsule Take 8 mg by mouth every evening.   [DISCONTINUED] WEGOVY  2.4 MG/0.75ML SOAJ Inject 2.4 mg into the skin once a week. (Patient taking differently: Inject 2.4 mg into the skin every Friday.)   No facility-administered encounter medications on file as of 09/21/2023.    Past Medical History:  Diagnosis Date   Anxiety    Depression    Hyperlipidemia    Hypertension    Lumbar herniated disc dx'd 03/2015   NSTEMI (non-ST elevated myocardial infarction) (HCC) 04/15/2015    Past Surgical History:  Procedure Laterality Date   ATRIAL FIBRILLATION ABLATION [name] 03/31/2023   Procedure: ATRIAL FIBRILLATION ABLATION;  Surgeon: Nancey Eulas BRAVO, MD;  [facility]: [facility] INVASIVE CV LAB;  Service: Cardiovascular;  Laterality: [name];   BACK SURGERY     CARDIAC CATHETERIZATION  04/15/2015   CARDIAC CATHETERIZATION [name] 04/15/2015   Procedure: Left Heart Cath and Coronary Angiography;  Surgeon: Dorn JINNY Lesches, MD;  Location: [facility] INVASIVE CV LAB;  Service: Cardiovascular;  Laterality: [name];   CARDIAC CATHETERIZATION [name] 05/20/2015  Procedure: Left Heart Cath and Cors/Grafts Angiography;  Surgeon: Peter M Swaziland, MD;  Location: [facility] INVASIVE CV LAB;  Service: Cardiovascular;  Laterality: [name];   CARDIOVERSION [name] 03/23/2022   Procedure: CARDIOVERSION;  Surgeon: Santo Stanly LABOR, MD;  Location: [facility] ENDOSCOPY;  Service: Cardiovascular;  Laterality: [name];   CARDIOVERSION [name] 09/21/2022   Procedure: CARDIOVERSION;  Surgeon: Alvan Ronal BRAVO, MD;  Location: [facility] INVASIVE CV LAB;  Service: Cardiovascular;  Laterality: [name];   CORONARY ARTERY BYPASS GRAFT [name] 04/18/2015   Procedure: CORONARY ARTERY BYPASS GRAFTING times four  with LIMA  to LAD, LEFT RADIAL ARTERY to OM, Right saphenous vein harvesting and utilized to Interm and to PD ENDOSCOPIC GREATER SAPHENOUS VEIN HARVEST (EVH) RIGHT THIGH;  Surgeon: Dallas KATHEE Jude, MD;  Location: [facility] OR;  Service: Open Heart Surgery;  Laterality: [name];   LUMBAR LAMINECTOMY  2002   RADIAL ARTERY HARVEST Left 04/18/2015   Procedure:  LEFT Arm RADIAL ARTERY HARVEST;  Surgeon: Dallas KATHEE Jude, MD;  [facility];  Service: Open Heart Surgery;  Laterality: Left;   TEE WITHOUT CARDIOVERSION [name] 04/18/2015   Procedure: TRANSESOPHAGEAL ECHOCARDIOGRAM (TEE);  Surgeon: Dallas KATHEE Jude, MD;  Location: [facility] OR;  Service: Open Heart Surgery;  Laterality: [name];   TONSILLECTOMY  1960s    Family History  Problem Relation Age of Onset   Brain cancer Mother    Heart attack Father 24       Deceased at this age   Cancer Father        Lung   Diabetes Sister    Brain cancer Sister     Social History   Socioeconomic History   Marital status: Married    Spouse name: Not on file   Number of children: Not on file   Years of education: Not on file   Highest education level: Not on file  Occupational History   Occupation: drives concrete mixer  Tobacco Use   Smoking status: Every Day    Current packs/day: 1.00    Average packs/day: 1 pack/day for 6.0 years (6.0 ttl pk-yrs)    Types: Cigarettes   Smokeless tobacco: Never   Tobacco comments:    Currently smoke 1 pack daily 04/14/23  Vaping Use   Vaping status: Never Used  Substance and Sexual Activity   Alcohol use: Never   Drug use: Not Currently    Types: Marijuana, Cocaine    Comment: in my teens   Sexual activity: Not on file  Other Topics Concern   Not on file  Social History Narrative   Right handed    Work Actuary    Social Drivers of Health   Financial Resource Strain: Low Risk  (09/21/2023)   Overall Financial Resource Strain (CARDIA)    Difficulty of Paying Living Expenses: Not very hard  Food Insecurity:  No Food Insecurity (10/28/2021)   Hunger Vital Sign    Worried About Running Out of Food in the Last Year: Never true    Ran Out of Food in the Last Year: Never true  Transportation Needs: No Transportation Needs (10/28/2021)   PRAPARE - Administrator, Civil Service (Medical): No    Lack of Transportation (Non-Medical): No  Physical Activity: Inactive (09/21/2023)   Exercise Vital Sign    Days of Exercise per Week: 0 days    Minutes of Exercise per Session: 0 min  Stress: Stress Concern Present (09/21/2023)   [employer] of Occupational Health - Occupational Stress Questionnaire  Feeling of Stress: To some extent  Social Connections: Unknown (09/21/2023)   Social Connection and Isolation Panel    Frequency of Communication with Friends and Family: Not on file    Frequency of Social Gatherings with Friends and Family: Never    Attends Religious Services: Never    Database administrator or Organizations: No    Attends Banker Meetings: Never    Marital Status: Married  Catering manager Violence: Not on file    Review of Systems  Psychiatric/Behavioral:  Positive for depression. Negative for substance abuse. The patient is nervous/anxious and has insomnia.   All other systems reviewed and are negative.       Objective    BP 137/85   Pulse (!) 56   Temp 98.1 F (36.7 C) (Oral)   Resp 16   Ht 6' (1.829 m)   Wt 253 lb (114.8 kg)   SpO2 93%   BMI 34.31 kg/m   Physical Exam Vitals and nursing note reviewed.  Constitutional:      General: He is not in acute distress. Cardiovascular:     Rate and Rhythm: Normal rate and regular rhythm.  Pulmonary:     Effort: Pulmonary effort is normal.     Breath sounds: Normal breath sounds.  Abdominal:     Palpations: Abdomen is soft.     Tenderness: There is no abdominal tenderness.  Neurological:     General: No focal deficit present.     Mental Status: He is alert and oriented to person, place, and time.   Psychiatric:        Mood and Affect: Affect normal. Mood is anxious.        Behavior: Behavior normal.         Assessment & Plan:  1. Anxiety and depression (Primary) Rexulti 1 mg added to regimen - FLUoxetine  (PROZAC ) 10 MG tablet; Take 3 tablets (30 mg total) by mouth daily.  Dispense: 270 tablet; Refill: 0  2. Gastroesophageal reflux disease without esophagitis Continue  - pantoprazole  (PROTONIX ) 40 MG tablet; Take 1 tablet (40 mg total) by mouth daily.  Dispense: 30 tablet; Refill: 0  3. Essential hypertension Appears stable. continue  4. Immunization due  - Pneumococcal conjugate vaccine 20-valent    Return in about 4 weeks (around 10/19/2023) for follow up.   Tanda Raguel SQUIBB, MD

## 2023-09-21 NOTE — Progress Notes (Unsigned)
 Patient would like to talk  about his medication refill of FLUoxetine  (PROZAC ) 10 MG tablet   Patient feels he is having a hard time and get easily annoyed with people.

## 2023-09-22 ENCOUNTER — Encounter: Payer: Self-pay | Admitting: Family Medicine

## 2023-10-10 ENCOUNTER — Encounter: Payer: Self-pay | Admitting: Family Medicine

## 2023-10-10 ENCOUNTER — Ambulatory Visit: Admitting: Family Medicine

## 2023-10-10 VITALS — BP 147/89 | HR 60 | Ht 72.0 in | Wt 262.0 lb

## 2023-10-10 DIAGNOSIS — I10 Essential (primary) hypertension: Secondary | ICD-10-CM

## 2023-10-10 DIAGNOSIS — F419 Anxiety disorder, unspecified: Secondary | ICD-10-CM

## 2023-10-10 DIAGNOSIS — F32A Depression, unspecified: Secondary | ICD-10-CM | POA: Diagnosis not present

## 2023-10-10 DIAGNOSIS — K219 Gastro-esophageal reflux disease without esophagitis: Secondary | ICD-10-CM | POA: Diagnosis not present

## 2023-10-10 MED ORDER — TRAZODONE HCL 100 MG PO TABS: 100.0000 mg | ORAL_TABLET | Freq: Every day | ORAL | 0 refills | Status: DC

## 2023-10-10 NOTE — Progress Notes (Signed)
 Established Patient Office Visit  Subjective    Patient ID: [name], male    DOB: 08/11/1965  Age: 58 y.o. MRN: [id number]  CC:  Chief Complaint  Patient presents with   Medical Management of Chronic Issues    Pt reports new medication is helping calm him down, but still has some anxiety. Interested in different medication he discussed with Dr. Tanda previously.     HPI [name] presents for follow up of anxiety. Reports that rexulti  originally worked but then it waned. He has also been having nightmares.   Outpatient Encounter Medications as of 10/10/2023  Medication Sig   acetaminophen  (TYLENOL ) 500 MG tablet Take 500 mg by mouth every 6 (six) hours as needed for moderate pain.   atorvastatin  (LIPITOR ) 40 MG tablet TAKE 1 TABLET BY MOUTH EVERY DAY   brexpiprazole  (REXULTI ) 1 MG TABS tablet Take 1 tablet (1 mg total) by mouth daily.   ELIQUIS  5 MG TABS tablet TAKE 1 TABLET BY MOUTH TWICE A DAY   ezetimibe  (ZETIA ) 10 MG tablet TAKE 1 TABLET BY MOUTH EVERY DAY   fenofibrate  (TRICOR ) 145 MG tablet TAKE 1 TABLET BY MOUTH EVERY DAY   FLUoxetine  (PROZAC ) 10 MG tablet Take 3 tablets (30 mg total) by mouth daily.   hydrochlorothiazide  (HYDRODIURIL ) 25 MG tablet TAKE 1 TABLET (25 MG TOTAL) BY MOUTH DAILY.   icosapent  Ethyl (VASCEPA ) 1 g capsule TAKE 2 CAPSULES BY MOUTH 2 TIMES DAILY.   isosorbide  mononitrate (IMDUR ) 60 MG 24 hr tablet Take 1.5 tablets (90 mg total) by mouth daily.   levocetirizine (XYZAL) 5 MG tablet Take 5 mg by mouth daily as needed for allergies.   lisinopril  (ZESTRIL ) 40 MG tablet TAKE 1 TABLET BY MOUTH EVERY DAY   metoprolol  succinate (TOPROL -XL) 50 MG 24 hr tablet TAKE 1 TABLET BY MOUTH DAILY. TAKE WITH OR IMMEDIATELY FOLLOWING A MEAL.   nitroGLYCERIN  (NITROSTAT ) 0.4 MG SL tablet Place 1 tablet (0.4 mg total) under the tongue every 5 (five) minutes x 3 doses as needed for chest pain.   pantoprazole  (PROTONIX ) 40 MG tablet Take 1 tablet (40 mg total)  by mouth daily.   spironolactone  (ALDACTONE ) 25 MG tablet TAKE 1 TABLET (25 MG TOTAL) BY MOUTH DAILY.   traZODone  (DESYREL ) 100 MG tablet Take 1 tablet (100 mg total) by mouth at bedtime.   No facility-administered encounter medications on file as of 10/10/2023.    Past Medical History:  Diagnosis Date   Anxiety    Depression    Hyperlipidemia    Hypertension    Lumbar herniated disc dx'd 03/2015   NSTEMI (non-ST elevated myocardial infarction) (HCC) 04/15/2015    Past Surgical History:  Procedure Laterality Date   ATRIAL FIBRILLATION ABLATION [name] 03/31/2023   Procedure: ATRIAL FIBRILLATION ABLATION;  Surgeon: Nancey Eulas BRAVO, MD;  Location: [facility] INVAS[facility] CV LAB;  Service: Cardiovascular;  Laterality: [name];   BACK SURGERY     CARDIAC CATHETERIZATION  04/15/2015   CARDIAC CATHETERIZATION [name] 04/15/2015   Procedure: Left Heart Cath and Coronary Angiography;  Surgeon: Dorn JINNY Lesches, MD;  Location: [facility] INVASIVE CV LAB;  Service: Cardiovascular;  Laterality: [name];   CARDIAC CATHETERIZATION [name] 05/20/2015   Procedure: Left Heart Cath and Cors/Grafts Angiography;  Surgeon: Peter M Swaziland, MD;  Location: [facility] INVASIVE CV LAB;  Service: Cardiovascular;  Laterality: [name];   CARDIOVERSION [name] 03/23/2022   Procedure: CARDIOVERSION;  Surgeon: Santo Stanly LABOR, MD;  Location[facility] ENDOS[facility];  Service: Cardiovascular;  Laterality: [name];  CARDIOVERSION [name] 09/21/2022   Procedure: CARDIOVERSION;  Surgeon: Alvan Ronal BRAVO, MD;  Location: [facility] INVASIVE CV LAB;  Service: Cardiovascular;  Laterality: [name];   CORONARY ARTERY BYPASS GRAFT [name] 04/18/2015   Procedure: CORONARY ARTERY BYPASS GRAFTING times four with LIMA  to LAD, LEFT RADIAL ARTERY to OM, Right saphenous vein harvesting and utilized to Interm and to PD ENDOSCOPIC GREATER SAPHENOUS VEIN HARVEST (EVH) RIGHT THIGH;  Surgeon: Dallas KATHEE Jude, MD;  Location: [facility] OR;  Service: Open Heart Surgery;  Laterality: [name];   LUMBAR LAMINECTOMY  2002   RADIAL ARTERY  HARVEST Left 04/18/2015   Procedure:  LEFT Arm RADIAL ARTERY HARVEST;  Surgeon: Dallas KATHEE Jude, MD;  Location: [facility] OR;  Service: Open Heart Surgery;  Laterality: Left;   TEE WITHOUT CARDIOVERSION [name] 04/18/2015   Procedure: TRANSESOPHAGEAL ECHOCARDIOGRAM (TEE);  Surgeon: Dallas KATHEE Jude, MD;  Location: [facility] OR;  Service: Open Heart Surgery;  Laterality: [name];   TONSILLECTOMY  1960s    Family History  Problem Relation Age of Onset   Brain cancer Mother    Heart attack Father 55       Deceased at this age   Cancer Father        Lung   Diabetes Sister    Brain cancer Sister     Social History   Socioeconomic History   Marital status: Married    Spouse name: Not on file   Number of children: Not on file   Years of education: Not on file   Highest education level: 11th grade  Occupational History   Occupation: drives concrete mixer  Tobacco Use   Smoking status: Every Day    Current packs/day: 1.00    Average packs/day: 1 pack/day for 6.0 years (6.0 ttl pk-yrs)    Types: Cigarettes   Smokeless tobacco: Never   Tobacco comments:    Currently smoke 1 pack daily 04/14/23  Vaping Use   Vaping status: Never Used  Substance and Sexual Activity   Alcohol use: Never   Drug use: Not Currently    Types: Marijuana, Cocaine    Comment: in my teens   Sexual activity: Not on file  Other Topics Concern   Not on file  Social History Narrative   Right handed    Work Actuary    Social Drivers of Health   Financial Resource Strain: Low Risk  (10/09/2023)   Overall Financial Resource Strain (CARDIA)    Difficulty of Paying Living Expenses: Not very hard  Food Insecurity: No Food Insecurity (10/09/2023)   Hunger Vital Sign    Worried About Running Out of Food in the Last Year: Never true    Ran Out of Food in the Last Year: Never true  Transportation Needs: No Transportation Needs (10/09/2023)   PRAPARE - Administrator, Civil Service (Medical): No    Lack of  Transportation (Non-Medical): No  Physical Activity: Inactive (10/09/2023)   Exercise Vital Sign    Days of Exercise per Week: 0 days    Minutes of Exercise per Session: Not on file  Stress: Stress Concern Present (10/09/2023)   [employer] of Occupational Health - Occupational Stress Questionnaire    Feeling of Stress: Rather much  Social Connections: Socially Isolated (10/09/2023)   Social Connection and Isolation Panel    Frequency of Communication with Friends and Family: Never    Frequency of Social Gatherings with Friends and Family: Never    Attends Religious Services: Never  Active Member of Clubs or Organizations: No    Attends Banker Meetings: Not on file    Marital Status: Married  Catering manager Violence: Not on file    Review of Systems  Psychiatric/Behavioral:  The patient is nervous/anxious.   All other systems reviewed and are negative.       Objective    BP (!) 147/89   Pulse 60   Ht 6' (1.829 m)   Wt 262 lb (118.8 kg)   SpO2 95%   BMI 35.53 kg/m   Physical Exam Vitals and nursing note reviewed.  Constitutional:      General: He is not in acute distress. Cardiovascular:     Rate and Rhythm: Normal rate and regular rhythm.  Pulmonary:     Effort: Pulmonary effort is normal.     Breath sounds: Normal breath sounds.  Abdominal:     Palpations: Abdomen is soft.     Tenderness: There is no abdominal tenderness.  Neurological:     General: No focal deficit present.     Mental Status: He is alert and oriented to person, place, and time.  Psychiatric:        Mood and Affect: Affect normal. Mood is anxious.        Behavior: Behavior normal.         Assessment & Plan:   1. Anxiety and depression (Primary) Will stop rexulti  and start trazodone  with prozac  to help manage nightmares.   2. Gastroesophageal reflux disease without esophagitis Doing well with present management. Continue   3. Essential hypertension Slightly  elevated readings. Continue  Return in about 4 weeks (around 11/07/2023) for follow up, chronic med issues.   Tanda Raguel SQUIBB, MD

## 2023-10-14 ENCOUNTER — Ambulatory Visit (HOSPITAL_COMMUNITY)
Admission: RE | Admit: 2023-10-14 | Discharge: 2023-10-14 | Disposition: A | Discharge status: Home / Self Care | Source: Ambulatory Visit | Attending: Cardiology | Admitting: Cardiology

## 2023-10-14 DIAGNOSIS — Z720 Tobacco use: Secondary | ICD-10-CM | POA: Insufficient documentation

## 2023-10-15 ENCOUNTER — Other Ambulatory Visit: Payer: Self-pay | Admitting: Family Medicine

## 2023-10-15 DIAGNOSIS — K219 Gastro-esophageal reflux disease without esophagitis: Secondary | ICD-10-CM

## 2023-10-17 NOTE — Telephone Encounter (Signed)
K21.9

## 2023-10-31 ENCOUNTER — Ambulatory Visit: Payer: Self-pay | Admitting: Cardiology

## 2023-11-07 ENCOUNTER — Encounter: Payer: Self-pay | Admitting: Family Medicine

## 2023-11-07 ENCOUNTER — Ambulatory Visit (INDEPENDENT_AMBULATORY_CARE_PROVIDER_SITE_OTHER): Admitting: Family Medicine

## 2023-11-07 VITALS — BP 124/77 | HR 54 | Ht 72.0 in | Wt 264.6 lb

## 2023-11-07 DIAGNOSIS — F32A Depression, unspecified: Secondary | ICD-10-CM | POA: Diagnosis not present

## 2023-11-07 DIAGNOSIS — I10 Essential (primary) hypertension: Secondary | ICD-10-CM | POA: Diagnosis not present

## 2023-11-07 DIAGNOSIS — R413 Other amnesia: Secondary | ICD-10-CM

## 2023-11-07 DIAGNOSIS — F419 Anxiety disorder, unspecified: Secondary | ICD-10-CM

## 2023-11-07 NOTE — Progress Notes (Signed)
 Established Patient Office Visit  Subjective    Patient ID: [name], male    DOB: April 20, 1965  Age: 58 y.o. MRN: [id number]  CC:  Chief Complaint  Patient presents with   Medical Management of Chronic Issues    HPI [name] presents for follow up of hypertension and anxiety/depression. Patient reports that he is doing very well with present management. 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   Outpatient Encounter Medications as of 11/07/2023  Medication Sig   acetaminophen  (TYLENOL ) 500 MG tablet Take 500 mg by mouth every 6 (six) hours as needed for moderate pain.   atorvastatin  (LIPITOR ) 40 MG tablet TAKE 1 TABLET BY MOUTH EVERY DAY   brexpiprazole  (REXULTI ) 1 MG TABS tablet Take 1 tablet (1 mg total) by mouth daily.   ELIQUIS  5 MG TABS tablet TAKE 1 TABLET BY MOUTH TWICE A DAY   ezetimibe  (ZETIA ) 10 MG tablet TAKE 1 TABLET BY MOUTH EVERY DAY   fenofibrate  (TRICOR ) 145 MG tablet TAKE 1 TABLET BY MOUTH EVERY DAY   FLUoxetine  (PROZAC ) 10 MG tablet Take 3 tablets (30 mg total) by mouth daily.   hydrochlorothiazide  (HYDRODIURIL ) 25 MG tablet TAKE 1 TABLET (25 MG TOTAL) BY MOUTH DAILY.   icosapent  Ethyl (VASCEPA ) 1 g capsule TAKE 2 CAPSULES BY MOUTH 2 TIMES DAILY.   isosorbide  mononitrate (IMDUR ) 60 MG 24 hr tablet Take 1.5 tablets (90 mg total) by mouth daily.   levocetirizine (XYZAL) 5 MG tablet Take 5 mg by mouth daily as needed for allergies.   lisinopril  (ZESTRIL ) 40 MG tablet TAKE 1 TABLET BY MOUTH EVERY DAY   metoprolol  succinate (TOPROL -XL) 50 MG 24 hr tablet TAKE 1 TABLET BY MOUTH DAILY. TAKE WITH OR IMMEDIATELY FOLLOWING A MEAL.   nitroGLYCERIN  (NITROSTAT ) 0.4 MG SL tablet Place 1 tablet (0.4 mg total) under the tongue every 5 (five) minutes x 3 doses as needed for chest pain.   pantoprazole  (PROTONIX ) 40 MG tablet TAKE 1 TABLET BY MOUTH EVERY DAY    spironolactone  (ALDACTONE ) 25 MG tablet TAKE 1 TABLET (25 MG TOTAL) BY MOUTH DAILY.   traZODone  (DESYREL ) 100 MG tablet Take 1 tablet (100 mg total) by mouth at bedtime.   No facility-administered encounter medications on file as of 11/07/2023.    Past Medical History:  Diagnosis Date   Anxiety    Depression    Hyperlipidemia    Hypertension    Lumbar herniated disc dx'd 03/2015   NSTEMI (non-ST elevated myocardial infarction) (HCC) 04/15/2015    Past Surgical History:  Procedure Laterality Date   ATRIAL FIBRILLATION ABLATION [name] 03/31/2023   Procedure: ATRIAL FIBRILLATION ABLATION;  Surgeon: Nancey Eulas BRAVO, MD;  Location: [facility] INVASIVE CV [facility];  Service: Cardiovascular;  Laterality: [name];   BACK SURGERY     CARDIAC CATHETERIZATION  04/15/2015   CARDIAC CATHETERIZATION [name] 04/15/2015   Procedure: Left Heart Cath and Coronary Angiography;  Surgeon: Dorn JINNY Lesches, MD;  Location: [facility] INVASIVE CV LAB;  Service: Cardiovascular;  Laterality: [name];   CARDIAC CATHETERIZATION [name] 05/20/2015   Procedure: Left Heart Cath and Cors/Grafts Angiography;  Surgeon: Peter M Swaziland, MD;  Location: [facility] INVASIVE CV LAB;  Service: Cardiovascular;  Laterality: [name];   CARDIOVERSION [name] 03/23/2022   Procedure: CARDIOVERSION;  Surgeon: Santo Stanly LABOR, MD;  Location: [facility] ENDOSCOPY;  Service: Cardiovascular;  Laterality: [name];   CARDIOVERSION [name]  09/21/2022   Procedure: CARDIOVERSION;  Surgeon: Alvan Ronal BRAVO, MD;  Location: [facility] INVASIVE CV LAB;  Service: Cardiovascular;  Laterality: [name];   CORONARY ARTERY BYPASS GRAFT [name] 04/18/2015   Procedure: CORONARY ARTERY BYPASS GRAFTING times four with LIMA  to LAD, LEFT RADIAL ARTERY to OM, Right saphenous vein harvesting and utilized to Interm and to PD ENDOSCOPIC GREATER SAPHENOUS VEIN HARVEST (EVH) RIGHT THIGH;  Surgeon: Dallas KATHEE Jude, MD;  Location: [facility] OR;  Service: Open Heart Surgery;  Laterality: [name];   LUMBAR LAMINECTOMY  2002   RADIAL ARTERY HARVEST Left 04/18/2015    Procedure:  LEFT Arm RADIAL ARTERY HARVEST;  Surgeon: Dallas KATHEE Jude, MD;  Location: [facility] OR;  Service: Open Heart Surgery;  Laterality: Left;   TEE WITHOUT CARDIOVERSION [name] 04/18/2015   Procedure: TRANSESOPHAGEAL ECHOCARDIOGRAM (TEE);  Surgeon: Dallas KATHEE Jude, MD;  Location: [facility] OR;  Service: Open Heart Surgery;  Laterality: [name];   TONSILLECTOMY  1960s    Family History  Problem Relation Age of Onset   Brain cancer Mother    Heart attack Father 45       Deceased at this age   Cancer Father        Lung   Diabetes Sister    Brain cancer Sister     Social History   Socioeconomic History   Marital status: Married    Spouse name: Not on file   Number of children: Not on file   Years of education: Not on file   Highest education level: 11th grade  Occupational History   Occupation: drives concrete mixer  Tobacco Use   Smoking status: Every Day    Current packs/day: 1.00    Average packs/day: 1 pack/day for 6.0 years (6.0 ttl pk-yrs)    Types: Cigarettes   Smokeless tobacco: Never   Tobacco comments:    Currently smoke 1 pack daily 04/14/23  Vaping Use   Vaping status: Never Used  Substance and Sexual Activity   Alcohol use: Never   Drug use: Not Currently    Types: Marijuana, Cocaine    Comment: in my teens   Sexual activity: Not on file  Other Topics Concern   Not on file  Social History Narrative   Right handed    Work Actuary    Social Drivers of Health   Financial Resource Strain: Low Risk  (10/09/2023)   Overall Financial Resource Strain (CARDIA)    Difficulty of Paying Living Expenses: Not very hard  Food Insecurity: No Food Insecurity (10/09/2023)   Hunger Vital Sign    Worried About Running Out of Food in the Last Year: Never true    Ran Out of Food in the Last Year: Never true  Transportation Needs: No Transportation Needs (10/09/2023)   PRAPARE - Administrator, Civil Service (Medical): No    Lack of Transportation  (Non-Medical): No  Physical Activity: Inactive (10/09/2023)   Exercise Vital Sign    Days of Exercise per Week: 0 days    Minutes of Exercise per Session: Not on file  Stress: Stress Concern Present (10/09/2023)   [employer] of Occupational Health - Occupational Stress Questionnaire    Feeling of Stress: Rather much  Social Connections: Socially Isolated (10/09/2023)   Social Connection and Isolation Panel    Frequency of Communication with Friends and Family: Never    Frequency of Social Gatherings with Friends and Family: Never    Attends Religious Services: Never    Active Member  of Clubs or Organizations: No    Attends Banker Meetings: Not on file    Marital Status: Married  Catering manager Violence: Not on file    Review of Systems  Psychiatric/Behavioral:  Positive for memory loss.   All other systems reviewed and are negative.       Objective    BP 124/77   Pulse (!) 54   Ht 6' (1.829 m)   Wt 264 lb 9.6 oz (120 kg)   SpO2 94%   BMI 35.89 kg/m   Physical Exam Vitals and nursing note reviewed.  Constitutional:      General: He is not in acute distress. Cardiovascular:     Rate and Rhythm: Normal rate and regular rhythm.  Pulmonary:     Effort: Pulmonary effort is normal.     Breath sounds: Normal breath sounds.  Abdominal:     Palpations: Abdomen is soft.     Tenderness: There is no abdominal tenderness.  Neurological:     General: No focal deficit present.     Mental Status: He is alert and oriented to person, place, and time.  Psychiatric:        Mood and Affect: Mood and affect normal.        Behavior: Behavior normal.         Assessment & Plan:  1. Anxiety and depression (Primary) Improved with present management. Continue   2. Essential hypertension Appears stable. Continue   3. Memory deficit Referral for further eval/mgt - Ambulatory referral to Neurology  Return in about 3 months (around 02/07/2024) for follow up.    Tanda Raguel SQUIBB, MD

## 2023-11-20 ENCOUNTER — Encounter: Payer: Self-pay | Admitting: Family Medicine

## 2023-11-22 ENCOUNTER — Ambulatory Visit
Admission: EM | Admit: 2023-11-22 | Discharge: 2023-11-22 | Disposition: A | Discharge status: Home / Self Care | Attending: Emergency Medicine | Admitting: Emergency Medicine

## 2023-11-22 ENCOUNTER — Ambulatory Visit (INDEPENDENT_AMBULATORY_CARE_PROVIDER_SITE_OTHER)

## 2023-11-22 DIAGNOSIS — R051 Acute cough: Secondary | ICD-10-CM

## 2023-11-22 MED ORDER — LEVOFLOXACIN 750 MG PO TABS: 750.0000 mg | ORAL_TABLET | Freq: Every day | ORAL | 0 refills | Status: AC

## 2023-11-22 MED ORDER — BENZONATATE 100 MG PO CAPS: 100.0000 mg | ORAL_CAPSULE | Freq: Three times a day (TID) | ORAL | 0 refills | Status: AC | PRN

## 2023-11-22 NOTE — ED Triage Notes (Signed)
 Pt states cough and congestion for the past 4 days.  States he has been taking Mucinex  at home.

## 2023-11-22 NOTE — ED Provider Notes (Signed)
 [facility]    CSN: [id number] Arrival date & time: 11/22/23  0804     History   Chief Complaint Chief Complaint  Patient presents with   Cough    HPI [name] is a 58 y.o. male.  Here with nasal congestion and cough for 4 days Cough somewhat productive  Some tightness in the chest with cough attacks.  Denies shortness of breath or wheezing No fevers, but had chills  Using mucinex    Not sure of sick contacts No recent travel  He is a smoker. Reports emphysema history  Cardiac history; hypertension, NSTEMI, status post CABG x 4, A-fib   Past Medical History:  Diagnosis Date   Anxiety    Depression    Hyperlipidemia    Hypertension    Lumbar herniated disc dx'd 03/2015   NSTEMI (non-ST elevated myocardial infarction) (HCC) 04/15/2015    Patient Active Problem List   Diagnosis Date Noted   Elevated fasting blood sugar 08/03/2022   Persistent atrial fibrillation (HCC) 03/10/2022   Hypercoagulable state due to persistent atrial fibrillation (HCC) 03/10/2022   Benign prostatic hyperplasia with lower urinary tract symptoms 07/30/2021   Gastroesophageal reflux disease 07/30/2021   Generalized anxiety disorder 07/30/2021   Impaired fasting glucose 07/30/2021   Atherosclerotic heart disease of native coronary artery without angina pectoris 07/30/2021   Hypertension 07/30/2021   Obese 08/21/2020   Lumbar radiculopathy 12/18/2018   Hyperlipidemia 05/05/2016   Coronary artery disease due to lipid rich plaque 08/07/2015   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   Hypokalemia 04/15/2015    Essential (primary) hypertension 04/15/2015   NSTEMI (non-ST elevated myocardial infarction) (HCC) 04/15/2015   Tobacco abuse 04/15/2015    Past Surgical History:  Procedure Laterality Date   ATRIAL FIBRILLATION ABLATION [name] 03/31/2023   Procedure: ATRIAL FIBRILLATION ABLATION;  Surgeon: Nancey Eulas BRAVO, MD;  Location: [facility] INVASIVE CV LAB;  Service: Cardiovascular;  Laterality: [name];   BACK SURGERY     CARDIAC CATHETERIZATION  04/15/2015   CARDIAC CATHETERIZATION [name] 04/15/2015   Procedure: Left Heart Cath and Coronary Angiography;  Surgeon: Dorn JINNY Lesches, MD;  Location: [facility] INVASIVE CV LAB;  Service: Cardiovascular;  Laterality: [name];   CARDIAC CATHETERIZATION [name] 05/20/2015   Procedure: Left Heart Cath and Cors/Grafts Angiography;  Surgeon: Peter M Swaziland, MD;  Location: [facility] INVASIVE CV LAB;  Service: Cardiovascular;  Laterality: [name];   CARDIOVERSION [name] 03/23/2022   Procedure: CARDIOVERSION;  Surgeon: Santo Stanly LABOR, MD;  Location: [facility] ENDOS[facility];  Service: Cardiovascular;  Laterality: [name];   CARDIOVERSION [name] 09/21/2022   Procedure: CARDIOVERSION;  Surgeon: Alvan Ronal BRAVO, MD;  Location: [facility] INVAS[facility] CV LAB;  Service: Cardiovascular;  Laterality: [name];   CORONARY ARTERY BYPASS GRAFT [name] 04/18/2015   Procedure: CORONARY ARTERY BYPASS GRAFTING times four with LIMA  to LAD, LEFT RADIAL ARTERY to OM, Right saphenous vein harvesting and utilized to Interm and to PD ENDOSCOPIC GREATER SAPHENOUS VEIN HARVEST (EVH) RIGHT THIGH;  Surgeon: Dallas KATHEE Jude, MD;  Location: [facility] OR;  Service: Open Heart Surgery;  Laterality: [name];   LUMBAR LAMINECTOMY  2002   RADIAL ARTERY HARVEST Left 04/18/2015  Procedure:  LEFT Arm RADIAL ARTERY HARVEST;  Surgeon: Dallas KATHEE Jude, MD;  Location: [facility] OR;  Service: Open Heart Surgery;  Laterality: Left;   TEE WITHOUT CARDIOVERSION [name] 04/18/2015   Procedure: TRANSESOPHAGEAL ECHOCARDIOGRAM (TEE);  Surgeon: Dallas KATHEE Jude, MD;  Location: [facility] OR;  Service: Open Heart  Surgery;  Laterality: [name];   TONSILLECTOMY  1960s     Home Medications    Prior to Admission medications   Medication Sig Start Date End Date Taking? Authorizing Provider  benzonatate  (TESSALON ) 100 MG capsule Take 1 capsule (100 mg total) by mouth 3 (three) times daily as needed for cough. 11/22/23  Yes Illeana Edick, [place], PA-C  levofloxacin  (LEVAQUIN ) 750 MG tablet Take 1 tablet (750 mg total) by mouth daily for 5 days. 11/22/23 11/27/23 Yes Tykia Mellone, [place]berry, PA-C  acetaminophen  (TYLENOL ) 500 MG tablet Take 500 mg by mouth every 6 (six) hours as needed for moderate pain.    [provider]  atorvastatin  (LIPITOR ) 40 MG tablet TAKE 1 TABLET BY MOUTH EVERY DAY 08/08/23   Jeffrie Oneil BROCKS, MD  brexpiprazole  (REXULTI ) 1 MG TABS tablet Take 1 tablet (1 mg total) by mouth daily. 09/21/23   Tanda Bleacher, MD  ELIQUIS  5 MG TABS tablet TAKE 1 TABLET BY MOUTH TWICE A DAY 09/05/23   Mealor, Augustus E, MD  ezetimibe  (ZETIA ) 10 MG tablet TAKE 1 TABLET BY MOUTH EVERY DAY 07/21/23   Jeffrie Oneil BROCKS, MD  fenofibrate  (TRICOR ) 145 MG tablet TAKE 1 TABLET BY MOUTH EVERY DAY 08/08/23   Jeffrie Oneil BROCKS, MD  FLUoxetine  (PROZAC ) 10 MG tablet Take 3 tablets (30 mg total) by mouth daily. 09/21/23   Tanda Bleacher, MD  hydrochlorothiazide  (HYDRODIURIL ) 25 MG tablet TAKE 1 TABLET (25 MG TOTAL) BY MOUTH DAILY. 08/08/23   Jeffrie Oneil BROCKS, MD  icosapent  Ethyl (VASCEPA ) 1 g capsule TAKE 2 CAPSULES BY MOUTH 2 TIMES DAILY. 08/08/23   Jeffrie Oneil BROCKS, MD  isosorbide  mononitrate (IMDUR ) 60 MG 24 hr tablet Take 1.5 tablets (90 mg total) by mouth daily. 09/15/23   Jeffrie Oneil BROCKS, MD  levocetirizine (XYZAL) 5 MG tablet Take 5 mg by mouth daily as needed for allergies.    [provider]  lisinopril  (ZESTRIL ) 40 MG tablet TAKE 1 TABLET BY MOUTH EVERY DAY 08/08/23   Jeffrie Oneil BROCKS, MD  metoprolol  succinate (TOPROL -XL) 50 MG 24 hr tablet TAKE 1 TABLET BY MOUTH DAILY. TAKE WITH OR IMMEDIATELY FOLLOWING A MEAL. 08/08/23   Jeffrie Oneil BROCKS, MD   nitroGLYCERIN  (NITROSTAT ) 0.4 MG SL tablet Place 1 tablet (0.4 mg total) under the tongue every 5 (five) minutes x 3 doses as needed for chest pain. 07/07/20   Hobart Powell BRAVO, MD  pantoprazole  (PROTONIX ) 40 MG tablet TAKE 1 TABLET BY MOUTH EVERY DAY 10/17/23   Tanda Bleacher, MD  spironolactone  (ALDACTONE ) 25 MG tablet TAKE 1 TABLET (25 MG TOTAL) BY MOUTH DAILY. 08/08/23   Jeffrie Oneil BROCKS, MD  traZODone  (DESYREL ) 100 MG tablet Take 1 tablet (100 mg total) by mouth at bedtime. 10/10/23   Tanda Bleacher, MD    Family History Family History  Problem Relation Age of Onset   Brain cancer Mother    Heart attack Father 60       Deceased at this age   Cancer Father        Lung   Diabetes Sister    Brain cancer Sister     Social History Social History   Tobacco Use   Smoking status:  Every Day    Current packs/day: 1.00    Average packs/day: 1 pack/day for 6.0 years (6.0 ttl pk-yrs)    Types: Cigarettes   Smokeless tobacco: Never   Tobacco comments:    Currently smoke 1 pack daily 04/14/23  Vaping Use   Vaping status: Never Used  Substance Use Topics   Alcohol use: Never   Drug use: Not Currently    Types: Marijuana, Cocaine    Comment: in my teens     Allergies   Atorvastatin , Claritin [loratadine], Codeine, and Vicodin [hydrocodone-acetaminophen ]   Review of Systems Review of Systems  Respiratory:  Positive for cough.    As per HPI  Physical Exam Triage Vital Signs ED Triage Vitals  Encounter Vitals Group     BP 11/22/23 0843 101/69     Girls Systolic BP Percentile --      Girls Diastolic BP Percentile --      Boys Systolic BP Percentile --      Boys Diastolic BP Percentile --      Pulse Rate 11/22/23 0843 (!) 52     Resp 11/22/23 0843 16     Temp 11/22/23 0843 98.1 F (36.7 C)     Temp Source 11/22/23 0843 Oral     SpO2 11/22/23 0843 97 %     Weight --      Height --      Head Circumference --      Peak Flow --      Pain Score 11/22/23 0845 0      Pain Loc --      Pain Education --      Exclude from Growth Chart --    No data found.  Updated Vital Signs BP 111/73 (BP Location: Left Arm)   Pulse (!) 52   Temp 98.1 F (36.7 C) (Oral)   Resp 16   SpO2 97%    Physical Exam Vitals and nursing note reviewed.  Constitutional:      Appearance: He is not ill-appearing or diaphoretic.  HENT:     Nose: No rhinorrhea.     Mouth/Throat:     Mouth: Mucous membranes are moist.     Pharynx: Oropharynx is clear. No posterior oropharyngeal erythema.  Eyes:     Conjunctiva/sclera: Conjunctivae normal.  Cardiovascular:     Rate and Rhythm: Normal rate and regular rhythm.     Pulses: Normal pulses.     Heart sounds: Normal heart sounds.  Pulmonary:     Effort: Pulmonary effort is normal.     Breath sounds: Wheezing and rales present.     Comments: Low pitched wheezing and crackles on exhale Abdominal:     Palpations: Abdomen is soft.     Tenderness: There is no abdominal tenderness.  Musculoskeletal:     Cervical back: Normal range of motion.  Lymphadenopathy:     Cervical: No cervical adenopathy.  Skin:    General: Skin is warm and dry.  Neurological:     Mental Status: He is alert and oriented to person, place, and time.     UC Treatments / Results  Labs (all labs ordered are listed, but only abnormal results are displayed) Labs Reviewed - No data to display  EKG  Radiology DG Chest 2 View Result Date: 11/22/2023 CLINICAL DATA:  Cough and congestion, wheezing and crackles EXAM: DG CHEST 2V COMPARISON:  October 14, 2023 FINDINGS: Suspected patchy opacity in the left lower lobe. No pleural effusions. No pneumothorax. Cardiomediastinal silhouette is  unchanged. Sequelae of prior CABG. Intact sternotomy wires. IMPRESSION: Suspected patchy opacity in the left lower lobe may represent pneumonia. Electronically Signed   By: Michaeline Blanch M.D.   On: 11/22/2023 09:39    Procedures Procedures   Medications Ordered in  UC Medications - No data to display  Initial Impression / Assessment and Plan / UC Course  I have reviewed the triage vital signs and the nursing notes.  Pertinent labs & imaging results that were available during my care of the patient were reviewed by me and considered in my medical decision making (see chart for details).   Afebrile. Initial BP soft, 101/69. Recheck 111/73 Sating 97% room air  With wheezing I have offered breathing treatment, patient politely declines stating he does not feel short of breath or chest discomfort at this time. Chest xray with patchy infiltrate left lower lobe. Suspicious for pneumonia. Images independently reviewed by me, agree with radiology interpretation. With comorbidities will treat with levaquin  750 mg daily x 5 days Tessalon  perles TID Return and ED precautions discussed  Note for work provided   Final Clinical Impressions(s) / UC Diagnoses   Final diagnoses:  Acute cough     Discharge Instructions      Your xray does show a small area of opacity that could be pneumonia.  I am treating you with an antibiotic.  Please take the Levaquin  1 tablet daily for 5 days in a row.  Always eat before you take it.  The tessalon  cough pills can be taken 3 times daily, 1-2 pills at a time.  Please go to the emergency department if symptoms worsen.     ED Prescriptions     Medication Sig Dispense Auth. Provider   levofloxacin  (LEVAQUIN ) 750 MG tablet Take 1 tablet (750 mg total) by mouth daily for 5 days. 5 tablet Damaria Stofko, PA-C   benzonatate  (TESSALON ) 100 MG capsule Take 1 capsule (100 mg total) by mouth 3 (three) times daily as needed for cough. 30 capsule Navya Timmons, [place]berry, PA-C      PDMP not reviewed this encounter.   Joshus Rogan, [place], PA-C 11/22/23 1005

## 2023-11-22 NOTE — Telephone Encounter (Signed)
 Called pt and confirmed he was okay with going to [facility] in [place] (where the referral was sent). He confirmed that is okay with him, I advised him the referral was sent already and I gave him the phone number to give them a call for scheduling.

## 2023-11-22 NOTE — Discharge Instructions (Addendum)
 Your xray does show a small area of opacity that could be pneumonia.  I am treating you with an antibiotic.  Please take the Levaquin  1 tablet daily for 5 days in a row.  Always eat before you take it.  The tessalon  cough pills can be taken 3 times daily, 1-2 pills at a time.  Please go to the emergency department if symptoms worsen.

## 2023-11-25 ENCOUNTER — Ambulatory Visit
Admission: EM | Admit: 2023-11-25 | Discharge: 2023-11-25 | Disposition: A | Discharge status: Home / Self Care | Attending: Family Medicine | Admitting: Family Medicine

## 2023-11-25 ENCOUNTER — Encounter: Payer: Self-pay | Admitting: Emergency Medicine

## 2023-11-25 ENCOUNTER — Ambulatory Visit (INDEPENDENT_AMBULATORY_CARE_PROVIDER_SITE_OTHER)

## 2023-11-25 DIAGNOSIS — J189 Pneumonia, unspecified organism: Secondary | ICD-10-CM

## 2023-11-25 MED ORDER — PREDNISONE 20 MG PO TABS: 40.0000 mg | ORAL_TABLET | Freq: Every day | ORAL | 0 refills | Status: AC

## 2023-11-25 NOTE — Discharge Instructions (Signed)
 You were seen today for continued cough after diagnosis of pneumonia.  Your chest xray was read as normal today, no worse than previous.  Please continue the levaquin . I have sent out a steroid for 5 days as well to help with cough and wheezing.  Please return if you are not improving or continue with symptoms after treatment is complete.

## 2023-11-25 NOTE — ED Provider Notes (Signed)
 [facility]    CSN: [id number] Arrival date & time: 11/25/23  0802      History   Chief Complaint Chief Complaint  Patient presents with   URI    HPI [name] is a 58 y.o. male.    URI Presenting symptoms: cough   Associated symptoms: wheezing    Patient is here for URI symptoms x 7 days.  He was seen here 4 days ago, questionable pneumonia on chest xray.  Given abx, and does not feel he is improving.  States he is no worse, but just not better.  He is still coughing.  Some wheezing,  no sob.  He feels he cannot catch his breath when he starts coughing.  Mild runny nose.  No fevers/chills.  Given levaquin  and tessalon .        Past Medical History:  Diagnosis Date   Anxiety    Depression    Hyperlipidemia    Hypertension    Lumbar herniated disc dx'd 03/2015   NSTEMI (non-ST elevated myocardial infarction) (HCC) 04/15/2015    Patient Active Problem List   Diagnosis Date Noted   Elevated fasting blood sugar 08/03/2022   Persistent atrial fibrillation (HCC) 03/10/2022   Hypercoagulable state due to persistent atrial fibrillation (HCC) 03/10/2022   Benign prostatic hyperplasia with lower urinary tract symptoms 07/30/2021   Gastroesophageal reflux disease 07/30/2021   Generalized anxiety disorder 07/30/2021   Impaired fasting glucose 07/30/2021   Atherosclerotic heart disease of native coronary artery without angina pectoris 07/30/2021   Hypertension 07/30/2021   Obese 08/21/2020   Lumbar radiculopathy 12/18/2018   Hyperlipidemia 05/05/2016   Coronary artery disease due to lipid rich plaque 08/07/2015   S/P CABG (coronary artery bypass graft) 08/07/2015   Essential hypertension 08/07/2015   PAF (paroxysmal atrial fibrillation) (HCC) 08/07/2015   Encounter for long-term (current) use of high-risk medication 08/07/2015   Hypertensive emergency 05/20/2015   CAD- LIMA to LAD, Left radial artery to OM, SVG to Intermediate, and SVG to PD)  03/2015. 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   Hypokalemia 04/15/2015   Essential (primary) hypertension 04/15/2015   NSTEMI (non-ST elevated myocardial infarction) (HCC) 04/15/2015   Tobacco abuse 04/15/2015    Past Surgical History:  Procedure Laterality Date   ATRIAL FIBRILLATION ABLATION [name] 03/31/2023   Procedure: ATRIAL FIBRILLATION ABLATION;  Surgeon: Nancey Eulas BRAVO, MD;  Location: [facility] INVASIVE CV LAB;  Service: Cardiovascular;  Laterality: [name];   BACK SURGERY     CARDIAC CATHETERIZATION  04/15/2015   CARDIAC CATHETERIZATION [name] 04/15/2015   Procedure: Left Heart Cath and Coronary Angiography;  Surgeon: Dorn JINNY Lesches, MD;  Location: [facility] INVASIVE CV LAB;  Service: Cardiovascular;  Laterality: [name];   CARDIAC CATHETERIZATION [name] 05/20/2015   Procedure: Left Heart Cath and Cors/Grafts Angiography;  Surgeon: Peter M Swaziland, MD;  Location: [facility] INVASIVE CV LAB;  Service: Cardiovascular;  Laterality: [name];   CARDIOVERSION [name] 03/23/2022   Procedure: CARDIOVERSION;  Surgeon: Santo Stanly LABOR, MD;  Location: [facility] ENDOSCOPY;  Service: Cardiovascular;  Laterality: [name];   CARDIOVERSION [name] 09/21/2022   Procedure: CARDIOVERSION;  Surgeon: Alvan Ronal BRAVO, MD;  Location: [facility] INVASIVE CV LAB;  Service: Cardiovascular;  Laterality: [name];   CORONARY ARTERY BYPASS GRAFT [name] 04/18/2015   Procedure: CORONARY ARTERY BYPASS GRAFTING times four with LIMA  to LAD, LEFT RADIAL ARTERY to OM, Right saphenous vein harvesting and utilized to Interm and to PD ENDOSCOPIC GREATER SAPHENOUS VEIN HARVEST (EVH) RIGHT  THIGH;  Surgeon: Dallas KATHEE Jude, MD;  Location: [facility] OR;  Service: Open Heart Surgery;  Laterality: [name];   LUMBAR LAMINECTOMY  2002   RADIAL ARTERY HARVEST Left 04/18/2015   Procedure:  LEFT Arm RADIAL ARTERY HARVEST;  Surgeon: Dallas KATHEE Jude, MD;  Location: [facility] OR;  Service: Open Heart Surgery;  Laterality: Left;   TEE WITHOUT CARDIOVERSION [name] 04/18/2015   Procedure: TRANSESOPHAGEAL  ECHOCARDIOGRAM (TEE);  Surgeon: Dallas KATHEE Jude, MD;  Location: [facility] OR;  Service: Open Heart Surgery;  Laterality: [name];   TONSILLECTOMY  1960s       Home Medications    Prior to Admission medications   Medication Sig Start Date End Date Taking? Authorizing Provider  acetaminophen  (TYLENOL ) 500 MG tablet Take 500 mg by mouth every 6 (six) hours as needed for moderate pain.    [provider]  atorvastatin  (LIPITOR ) 40 MG tablet TAKE 1 TABLET BY MOUTH EVERY DAY 08/08/23   Jeffrie Oneil BROCKS, MD  benzonatate  (TESSALON ) 100 MG capsule Take 1 capsule (100 mg total) by mouth 3 (three) times daily as needed for cough. 11/22/23   Rising, Asberry, PA-C  brexpiprazole  (REXULTI ) 1 MG TABS tablet Take 1 tablet (1 mg total) by mouth daily. 09/21/23   Tanda Bleacher, MD  ELIQUIS  5 MG TABS tablet TAKE 1 TABLET BY MOUTH TWICE A DAY 09/05/23   Mealor, Augustus E, MD  ezetimibe  (ZETIA ) 10 MG tablet TAKE 1 TABLET BY MOUTH EVERY DAY 07/21/23   Jeffrie Oneil BROCKS, MD  fenofibrate  (TRICOR ) 145 MG tablet TAKE 1 TABLET BY MOUTH EVERY DAY 08/08/23   Jeffrie Oneil BROCKS, MD  FLUoxetine  (PROZAC ) 10 MG tablet Take 3 tablets (30 mg total) by mouth daily. 09/21/23   Tanda Bleacher, MD  hydrochlorothiazide  (HYDRODIURIL ) 25 MG tablet TAKE 1 TABLET (25 MG TOTAL) BY MOUTH DAILY. 08/08/23   Jeffrie Oneil BROCKS, MD  icosapent  Ethyl (VASCEPA ) 1 g capsule TAKE 2 CAPSULES BY MOUTH 2 TIMES DAILY. 08/08/23   Jeffrie Oneil BROCKS, MD  isosorbide  mononitrate (IMDUR ) 60 MG 24 hr tablet Take 1.5 tablets (90 mg total) by mouth daily. 09/15/23   Jeffrie Oneil BROCKS, MD  levocetirizine (XYZAL) 5 MG tablet Take 5 mg by mouth daily as needed for allergies.    [provider]  levofloxacin  (LEVAQUIN ) 750 MG tablet Take 1 tablet (750 mg total) by mouth daily for 5 days. 11/22/23 11/27/23  Rising, Asberry, PA-C  lisinopril  (ZESTRIL ) 40 MG tablet TAKE 1 TABLET BY MOUTH EVERY DAY 08/08/23   Jeffrie Oneil BROCKS, MD  metoprolol  succinate (TOPROL -XL) 50 MG 24 hr tablet TAKE 1  TABLET BY MOUTH DAILY. TAKE WITH OR IMMEDIATELY FOLLOWING A MEAL. 08/08/23   Jeffrie Oneil BROCKS, MD  nitroGLYCERIN  (NITROSTAT ) 0.4 MG SL tablet Place 1 tablet (0.4 mg total) under the tongue every 5 (five) minutes x 3 doses as needed for chest pain. 07/07/20   Hobart Powell BRAVO, MD  pantoprazole  (PROTONIX ) 40 MG tablet TAKE 1 TABLET BY MOUTH EVERY DAY 10/17/23   Tanda Bleacher, MD  spironolactone  (ALDACTONE ) 25 MG tablet TAKE 1 TABLET (25 MG TOTAL) BY MOUTH DAILY. 08/08/23   Jeffrie Oneil BROCKS, MD  traZODone  (DESYREL ) 100 MG tablet Take 1 tablet (100 mg total) by mouth at bedtime. 10/10/23   Tanda Bleacher, MD    Family History Family History  Problem Relation Age of Onset   Brain cancer Mother    Heart attack Father 18       Deceased at this age  Cancer Father        Lung   Diabetes Sister    Brain cancer Sister     Social History Social History   Tobacco Use   Smoking status: Every Day    Current packs/day: 1.00    Average packs/day: 1 pack/day for 6.0 years (6.0 ttl pk-yrs)    Types: Cigarettes    Passive exposure: Current   Smokeless tobacco: Never   Tobacco comments:    Currently smoke 1 pack daily 04/14/23  Vaping Use   Vaping status: Never Used  Substance Use Topics   Alcohol use: Never   Drug use: Not Currently    Types: Marijuana, Cocaine    Comment: in my teens     Allergies   Atorvastatin , Claritin [loratadine], Codeine, and Vicodin [hydrocodone-acetaminophen ]   Review of Systems Review of Systems  Constitutional: Negative.   HENT: Negative.    Respiratory:  Positive for cough and wheezing.   Cardiovascular: Negative.   Gastrointestinal: Negative.   Genitourinary: Negative.   Musculoskeletal: Negative.   Psychiatric/Behavioral: Negative.       Physical Exam Triage Vital Signs ED Triage Vitals  Encounter Vitals Group     BP 11/25/23 0825 96/64     Girls Systolic BP Percentile --      Girls Diastolic BP Percentile --      Boys Systolic BP Percentile  --      Boys Diastolic BP Percentile --      Pulse Rate 11/25/23 0825 (!) 58     Resp 11/25/23 0825 18     Temp 11/25/23 0825 98.2 F (36.8 C)     Temp Source 11/25/23 0825 Oral     SpO2 11/25/23 0825 97 %     Weight 11/25/23 0823 264 lb 8.8 oz (120 kg)     Height --      Head Circumference --      Peak Flow --      Pain Score 11/25/23 0823 0     Pain Loc --      Pain Education --      Exclude from Growth Chart --    No data found.  Updated Vital Signs BP 96/64 (BP Location: Left Arm)   Pulse (!) 58   Temp 98.2 F (36.8 C) (Oral)   Resp 18   Wt 120 kg   SpO2 97%   BMI 35.88 kg/m   Visual Acuity Right Eye Distance:   Left Eye Distance:   Bilateral Distance:    Right Eye Near:   Left Eye Near:    Bilateral Near:     Physical Exam Constitutional:      General: He is not in acute distress.    Appearance: Normal appearance. He is normal weight. He is not ill-appearing or toxic-appearing.  HENT:     Nose: Nose normal.  Cardiovascular:     Rate and Rhythm: Normal rate and regular rhythm.  Pulmonary:     Breath sounds: Examination of the left-upper field reveals wheezing. Examination of the right-lower field reveals rhonchi. Examination of the left-lower field reveals rhonchi. Wheezing and rhonchi present.  Neurological:     General: No focal deficit present.     Mental Status: He is alert.  Psychiatric:        Mood and Affect: Mood normal.      UC Treatments / Results  Labs (all labs ordered are listed, but only abnormal results are displayed) Labs Reviewed - No data to display  EKG   Radiology DG Chest 2 View Result Date: 11/25/2023 CLINICAL DATA:  Cough. EXAM: CHEST - 2 VIEW COMPARISON:  November 22, 2023. FINDINGS: The heart size and mediastinal contours are within normal limits. Status post coronary artery bypass graft. Both lungs are clear. The visualized skeletal structures are unremarkable. IMPRESSION: No active cardiopulmonary disease.  Electronically Signed   By: Lynwood Landy Raddle M.D.   On: 11/25/2023 08:54    Procedures Procedures (including critical care time)  Medications Ordered in UC Medications - No data to display  Initial Impression / Assessment and Plan / UC Course  I have reviewed the triage vital signs and the nursing notes.  Pertinent labs & imaging results that were available during my care of the patient were reviewed by me and considered in my medical decision making (see chart for details).   Final Clinical Impressions(s) / UC Diagnoses   Final diagnoses:  Pneumonia of left lower lobe due to infectious organism     Discharge Instructions      You were seen today for continued cough after diagnosis of pneumonia.  Your chest xray was read as normal today, no worse than previous.  Please continue the levaquin . I have sent out a steroid for 5 days as well to help with cough and wheezing.  Please return if you are not improving or continue with symptoms after treatment is complete.     ED Prescriptions     Medication Sig Dispense Auth. Provider   predniSONE  (DELTASONE ) 20 MG tablet Take 2 tablets (40 mg total) by mouth daily for 5 days. 10 tablet Darral Longs, MD      PDMP not reviewed this encounter.   Darral Longs, MD 11/25/23 [phone]

## 2023-11-25 NOTE — ED Triage Notes (Signed)
 Pt presents c/o URI x 7 days. Pt reports he was previously seen for the sxs and diagnosed with pneumonia. Pt says he has been taking meds prescribed for 4 days but sxs are not improving. Pt denies any new sxs.

## 2023-12-06 ENCOUNTER — Encounter: Payer: Self-pay | Admitting: Family Medicine

## 2023-12-07 NOTE — Telephone Encounter (Signed)
 I put in a referral request to pt's insurance ([employer]). It was approved certification #[id number].   Tried to call pt - number listed for him did not go through. Tried wife's number. No answer, LVM to call back. Will also reply to pt's mychart message.

## 2024-01-07 ENCOUNTER — Other Ambulatory Visit: Payer: Self-pay | Admitting: Family Medicine

## 2024-01-12 ENCOUNTER — Encounter: Payer: Self-pay | Admitting: Family Medicine

## 2024-02-07 ENCOUNTER — Ambulatory Visit: Admitting: Family Medicine

## 2024-02-13 ENCOUNTER — Other Ambulatory Visit: Payer: Self-pay | Admitting: Family Medicine

## 2024-02-13 DIAGNOSIS — F419 Anxiety disorder, unspecified: Secondary | ICD-10-CM

## 2024-02-26 ENCOUNTER — Other Ambulatory Visit: Payer: Self-pay | Admitting: Cardiovascular Disease

## 2024-02-26 DIAGNOSIS — I779 Disorder of arteries and arterioles, unspecified: Secondary | ICD-10-CM

## 2024-02-26 DIAGNOSIS — Z720 Tobacco use: Secondary | ICD-10-CM

## 2024-02-26 DIAGNOSIS — E782 Mixed hyperlipidemia: Secondary | ICD-10-CM

## 2024-02-26 DIAGNOSIS — I2510 Atherosclerotic heart disease of native coronary artery without angina pectoris: Secondary | ICD-10-CM

## 2024-02-26 DIAGNOSIS — I4891 Unspecified atrial fibrillation: Secondary | ICD-10-CM

## 2024-02-26 DIAGNOSIS — Z951 Presence of aortocoronary bypass graft: Secondary | ICD-10-CM

## 2024-02-26 DIAGNOSIS — I10 Essential (primary) hypertension: Secondary | ICD-10-CM

## 2024-02-26 DIAGNOSIS — E785 Hyperlipidemia, unspecified: Secondary | ICD-10-CM

## 2024-02-26 DIAGNOSIS — Z79899 Other long term (current) drug therapy: Secondary | ICD-10-CM

## 2024-02-27 NOTE — Telephone Encounter (Signed)
 Prescription refill request for Eliquis  received. Indication: AF Last office visit: 06/29/23  KATHEE Barrack NP Scr:  1.37 on 03/07/23  Epic Age: 58 Weight: 110.8kg  Based on above findings Eliquis  5mg  twice daily is the appropriate dose.  Refill approved.

## 2024-04-03 ENCOUNTER — Telehealth: Payer: Self-pay | Admitting: Cardiology

## 2024-04-03 ENCOUNTER — Other Ambulatory Visit: Payer: Self-pay | Admitting: *Deleted

## 2024-04-03 DIAGNOSIS — I4819 Other persistent atrial fibrillation: Secondary | ICD-10-CM

## 2024-04-03 DIAGNOSIS — E785 Hyperlipidemia, unspecified: Secondary | ICD-10-CM

## 2024-04-03 DIAGNOSIS — I10 Essential (primary) hypertension: Secondary | ICD-10-CM

## 2024-04-03 DIAGNOSIS — Z79899 Other long term (current) drug therapy: Secondary | ICD-10-CM

## 2024-04-03 NOTE — Addendum Note (Signed)
 Addended by: THEOTIS SHARLET PARAS on: 04/03/2024 02:33 PM   Modules accepted: Orders

## 2024-04-03 NOTE — Telephone Encounter (Signed)
 Patient is requesting orders to have labs drawn prior to 2/26 appointment with Dr. Jeffrie.

## 2024-04-09 ENCOUNTER — Other Ambulatory Visit: Payer: Self-pay | Admitting: Family Medicine

## 2024-04-10 ENCOUNTER — Other Ambulatory Visit: Payer: Self-pay | Admitting: Family Medicine

## 2024-04-10 DIAGNOSIS — K219 Gastro-esophageal reflux disease without esophagitis: Secondary | ICD-10-CM

## 2024-04-10 NOTE — Telephone Encounter (Signed)
 Requested Prescriptions  Pending Prescriptions Disp Refills   traZODone  (DESYREL ) 100 MG tablet [Pharmacy Med Name: TRAZODONE  100 MG TABLET] 90 tablet 0    Sig: TAKE 1 TABLET BY MOUTH EVERYDAY AT BEDTIME     Psychiatry: Antidepressants - Serotonin Modulator Passed - 04/10/2024 11:52 AM      Passed - Valid encounter within last 6 months    Recent Outpatient Visits           5 months ago Anxiety and depression   [facility], MD   6 months ago Anxiety and depression   [facility], MD   6 months ago Anxiety and depression   [facility], MD   1 year ago Anxiety and depression   [facility] [place], NP   1 year ago Annual physical exam   [facility], MD       Future Appointments             In 1 month Skains, Oneil BROCKS, MD [facility] [employer]

## 2024-04-27 ENCOUNTER — Other Ambulatory Visit: Payer: Self-pay | Admitting: Cardiology

## 2024-04-27 DIAGNOSIS — I2510 Atherosclerotic heart disease of native coronary artery without angina pectoris: Secondary | ICD-10-CM

## 2024-04-27 DIAGNOSIS — Z951 Presence of aortocoronary bypass graft: Secondary | ICD-10-CM

## 2024-04-27 DIAGNOSIS — E782 Mixed hyperlipidemia: Secondary | ICD-10-CM

## 2024-04-27 DIAGNOSIS — Z79899 Other long term (current) drug therapy: Secondary | ICD-10-CM

## 2024-04-27 DIAGNOSIS — I10 Essential (primary) hypertension: Secondary | ICD-10-CM

## 2024-04-27 DIAGNOSIS — E785 Hyperlipidemia, unspecified: Secondary | ICD-10-CM

## 2024-05-17 ENCOUNTER — Ambulatory Visit: Admitting: Cardiology
